# Patient Record
Sex: Female | Born: 1960 | Race: Black or African American | Hispanic: No | Marital: Married
Health system: Midwestern US, Community
[De-identification: ages and names within clinical notes are randomized; demographics above are authoritative.]

## PROBLEM LIST (undated history)

## (undated) DIAGNOSIS — K2900 Acute gastritis without bleeding: Secondary | ICD-10-CM

## (undated) DIAGNOSIS — K388 Other specified diseases of appendix: Secondary | ICD-10-CM

## (undated) DIAGNOSIS — N186 End stage renal disease: Secondary | ICD-10-CM

## (undated) DIAGNOSIS — Z992 Dependence on renal dialysis: Secondary | ICD-10-CM

## (undated) DIAGNOSIS — K3184 Gastroparesis: Secondary | ICD-10-CM

## (undated) DIAGNOSIS — I10 Essential (primary) hypertension: Secondary | ICD-10-CM

## (undated) DIAGNOSIS — F32A Depression, unspecified: Secondary | ICD-10-CM

## (undated) DIAGNOSIS — F329 Major depressive disorder, single episode, unspecified: Secondary | ICD-10-CM

## (undated) DIAGNOSIS — M869 Osteomyelitis, unspecified: Secondary | ICD-10-CM

## (undated) HISTORY — PX: AV FISTULA PLACEMENT: SHX1204

## (undated) HISTORY — PX: PACEMAKER INSERTION: SHX728

## (undated) HISTORY — PX: CHOLECYSTECTOMY: SHX55

## (undated) HISTORY — PX: BACK SURGERY: SHX140

---

## 2007-02-19 ENCOUNTER — Ambulatory Visit: Payer: Self-pay | Admitting: Cardiology

## 2007-06-19 DIAGNOSIS — M869 Osteomyelitis, unspecified: Secondary | ICD-10-CM

## 2007-06-19 HISTORY — DX: Osteomyelitis, unspecified: M86.9

## 2009-01-24 NOTE — Patient Instructions (Signed)
WE will call with plan for surgery

## 2009-01-24 NOTE — Progress Notes (Signed)
HISTORY OF PRESENT ILLNESS  Karen Fuller is a 48 y.o. female. Previousliy evaluated for Gastroparesis.   Marland KitchenHPI  In the interim 5 weeks Karen Fuller has been admitted on one occaision for one week  Her symtpoms are unchanged since our last eval.  She has reviewed the literature I provided her regarding enterra.      ROS    Physical Exam   Constitutional: She appears well-developed and well-nourished.   Eyes: No scleral icterus.   Neck: Neck supple. No tracheal deviation present. No thyromegaly present.   Cardiovascular: Normal rate, regular rhythm and normal heart sounds.    Pulmonary/Chest: Effort normal and breath sounds normal.   Abdominal: Soft. Bowel sounds are normal. She exhibits no distension. No tenderness. She has no rebound and no guarding.            Previous scars well healed   Musculoskeletal: She exhibits edema. She exhibits no tenderness.   Skin: Skin is warm and dry.       ASSESSMENT and PLAN    Essentially no improvement over the past five weeks.  The patient desires to move ahead with Enterra placement.  Risks an cautions discussed.  We will proceed with insurance eval

## 2009-02-04 NOTE — Progress Notes (Addendum)
Pt scheduled for gastric stim placement 03/02/09

## 2009-02-16 LAB — CBC WITH AUTOMATED DIFF
ABS. BASOPHILS: 0 10*3/uL (ref 0.0–0.1)
ABS. EOSINOPHILS: 0.3 10*3/uL (ref 0.0–0.4)
ABS. LYMPHOCYTES: 2 10*3/uL (ref 0.8–3.5)
ABS. MONOCYTES: 0.5 10*3/uL (ref 0.0–1.0)
ABS. NEUTROPHILS: 5.3 10*3/uL (ref 1.8–8.0)
BASOPHILS: 0 % (ref 0–1)
EOSINOPHILS: 4 % (ref 0–7)
HCT: 35.4 % (ref 35.0–47.0)
HGB: 11.7 g/dL (ref 11.5–16.0)
LYMPHOCYTES: 24 % (ref 12–49)
MCH: 31 PG (ref 26.0–34.0)
MCHC: 33.1 g/dL (ref 30.0–36.5)
MCV: 93.9 FL (ref 80.0–99.0)
MONOCYTES: 6 % (ref 5–13)
NEUTROPHILS: 66 % (ref 32–75)
PLATELET: 235 10*3/uL (ref 150–400)
RBC: 3.77 M/uL — ABNORMAL LOW (ref 3.80–5.20)
RDW: 15.1 % — ABNORMAL HIGH (ref 11.5–14.5)
WBC: 8.1 10*3/uL (ref 3.6–11.0)

## 2009-02-16 LAB — URINALYSIS W/ REFLEX CULTURE
Bilirubin: NEGATIVE
Glucose: 250 MG/DL — AB
Ketone: NEGATIVE MG/DL
Leukocyte Esterase: NEGATIVE
Nitrites: NEGATIVE
Protein: 300 MG/DL — AB
Specific gravity: 1.016 (ref 1.003–1.030)
Urobilinogen: 0.2 EU/DL (ref 0.2–1.0)
pH (UA): 7.5 (ref 5.0–8.0)

## 2009-02-16 LAB — METABOLIC PANEL, BASIC
Anion gap: 14 mmol/L (ref 5–15)
BUN/Creatinine ratio: 5 — ABNORMAL LOW (ref 12–20)
BUN: 48 MG/DL — ABNORMAL HIGH (ref 6–20)
CO2: 26 MMOL/L (ref 21–32)
Calcium: 8.3 MG/DL — ABNORMAL LOW (ref 8.5–10.1)
Chloride: 98 MMOL/L (ref 97–108)
Creatinine: 10.5 MG/DL — ABNORMAL HIGH (ref 0.6–1.3)
GFR est AA: 5 mL/min/{1.73_m2} — ABNORMAL LOW (ref >60)
GFR est non-AA: 4 mL/min/{1.73_m2} — ABNORMAL LOW (ref >60)
Glucose: 140 MG/DL — ABNORMAL HIGH (ref 65–100)
Potassium: 4.4 MMOL/L (ref 3.5–5.1)
Sodium: 138 MMOL/L (ref 136–145)

## 2009-02-16 NOTE — Progress Notes (Signed)
HISTORY OF PRESENT ILLNESS  Karen Fuller is a 48 y.o. female who has been diagnosed with gastroparesis x2 years. She has nausea/vomiting every other day with chronic abd pain. She has been hospitalized almost weekly x the past 5 weeks She states that her pain is all LUQ pain.   HPI    Review of Systems   Gastrointestinal: Positive for nausea, vomiting and abdominal pain.     Physical Exam   Constitutional: She is oriented to person, place, and time. She appears well-developed and well-nourished.   HENT:   Head: Normocephalic.   Neck: Normal range of motion. Neck supple.   Cardiovascular: Normal rate and regular rhythm.  Exam reveals no gallop and no friction rub.    No murmur heard.  Pulmonary/Chest: Effort normal and breath sounds normal.   Abdominal: Soft. Bowel sounds are normal. She exhibits no distension. No tenderness.          Neurological: She is alert and oriented to person, place, and time.   Skin: Skin is warm and dry.   Psychiatric: She has a normal mood and affect.     ASSESSMENT and PLAN  48 year old female diagnosed with gastroparesis x 2 years.   The patient desires to move ahead with Enterra placement.?? Risks an cautions discussed.

## 2009-02-17 LAB — CULTURE, URINE
Colonies Counted: <1000
Colony Count: <1000
Culture result:: NO GROWTH
Culture: NO GROWTH

## 2009-04-18 ENCOUNTER — Encounter

## 2009-04-18 NOTE — Progress Notes (Signed)
History and Physical    Subjective:     Karen Fuller is a 48 y.o.female with a history of gastroparesis for over 2 years.  .  . She has nausea/vomiting every other day with chronic abd pain. She has been hospitalized almost weekly in August and Sept for off and on for 5 weeks. She has been hospitalized 2 times in the past 3 weeks for nausea, vomiting and Abd pain. She states that she can eat normally when she does not have the pain. When she has pain she can only tolerate clear liquids.  She states that her pain is all LUQ pain.   Past Medical History   Diagnosis Date   ??? Reflux 01/12/2009   ??? DM (diabetes mellitus) 01/12/2009   ??? HTN (hypertension) 01/12/2009   ??? Chronic kidney failure 01/12/2009   ??? Abdominal pain 01/12/2009   ??? Nausea & vomiting 01/12/2009   ??? Gastroparesis 01/12/2009   ??? GERD (gastroesophageal reflux disease)    ??? Renal disease         Past Surgical History   Procedure Date   ??? Hx cesarean section      x2   ??? Hx cholecystectomy        No family history on file.   History   Substance Use Topics   ??? Tobacco Use: Not on file   ??? Alcohol Use:          Prior to Admission medications    Medication Sig Start Date End Date Taking? Authorizing Provider   esomeprazole (NEXIUM) 40 mg capsule Take  by mouth daily.   Yes Historical Provider   promethazine (PHENERGAN) 25 mg tablet Take 25 mg by mouth every six (6) hours as needed.   Yes Historical Provider   metoclopramide (REGLAN) 10 mg tablet Take 10 mg by mouth four (4) times daily (with meals and at night).   Yes Historical Provider   aspirin 81 mg chewable tablet Take 81 mg by mouth daily.   Yes Historical Provider   oxycodone-acetaminophen (PERCOCET) 5-325 mg per tablet Take 1 Tab by mouth every four (4) hours as needed.   Yes Historical Provider   calcium acetate (PHOSLO) 667 mg Cap Take  by mouth three (3) times daily (with meals).   Yes Historical Provider    scopolamine (TRANSDERM-SCOP) 1.5 mg 1 Patch by TransDERmal route every seventy-two (72) hours.   Yes Historical Provider   sucralfate (CARAFATE) 1 gram tablet Take 1 g by mouth four (4) times daily.   Yes Historical Provider   FOLIC ACID/VITAMIN B COMP W-C (RENA-VITE PO) Take  by mouth.   Yes Historical Provider   quetiapine (SEROQUEL) 50 mg tablet Take 50 mg by mouth two (2) times a day.   Yes Historical Provider   citalopram (CELEXA) 10 mg tablet Take  by mouth daily.   Yes Historical Provider   carvedilol (COREG) 25 mg tablet Take 25 mg by mouth two (2) times daily (with meals).   Yes Historical Provider   insulin glargine (LANTUS) 100 unit/mL injection by SubCUTAneous route once.   Yes Historical Provider   insulin aspart (NOVOLOG FLEXPEN) 100 unit/mL flexpen by SubCUTAneous route.   Yes Historical Provider   alprazolam Prudy Feeler) 0.25 mg tablet Take  by mouth nightly as needed.   Yes Historical Provider   pregabalin (LYRICA) 75 mg capsule Take  by mouth.    Historical Provider   meclizine (ANTIVERT) 12.5 mg tablet Take  by mouth three (3) times  daily as needed.    Historical Provider   fosinopril-hydrochlorothiazide (MONOPRIL HCT) 20-12.5 mg per tablet Take 1 Tab by mouth daily.    Historical Provider   nitroglycerin (NITRODUR) 0.4 mg/hr 1 Patch by TransDERmal route daily.    Historical Provider   amlodipine (NORVASC) 10 mg tablet Take  by mouth daily.    Historical Provider   ERGOCALCIFEROL (VITAMIN D PO) Take  by mouth.    Historical Provider   pantoprazole (PROTONIX) 40 mg tablet Take 40 mg by mouth daily.    Historical Provider   clonidine (CATAPRESS) 0.3 mg tablet Take 0.3 mg by mouth two (2) times a day.    Historical Provider   HYDROCODONE BIT/ACETAMINOPHEN (LORTAB PO) Take  by mouth.    Historical Provider       Allergies   Allergen Reactions   ??? Hydromorphone Shortness of Breath          Review of Systems:  Significant for nausea, vomiting and Abd pain.    Objective:     Intake and Output:             Physical Exam:   BP 114/60   Pulse 83   Temp 97.5 ??F (36.4 ??C)   Ht 5\' 2"  (1.575 m)   Wt 239 lb 8 oz (108.636 kg)   SpO2 97%  General appearance: alert, cooperative, no distress, appears stated age  Lungs: clear to auscultation bilaterally  Heart: regular rate and rhythm, S1, S2 normal, no murmur, click, rub or gallop  Abdomen: soft, non-tender. Bowel sounds normal. No masses,  no organomegaly  Skin: Skin color, texture, turgor normal. No rashes or lesions        Assessment:     48 year old female with 2 year history of gastroparesis   Plan:     Enterra placement. Consent was signed. All patients questions were answered at the time of the visit by Dr. Geronimo Boot.   Patient is ready to proceed with surgery.     Signed By: Rae Roam Leonor Liv, NP     April 18, 2009

## 2009-04-18 NOTE — Progress Notes (Signed)
H & P gastric stimulater insertion 04-28-09

## 2009-04-18 NOTE — Progress Notes (Addendum)
Addended byAreta Haber on: 04/18/2009      Modules accepted: Orders

## 2009-04-19 LAB — METABOLIC PANEL, COMPREHENSIVE
A-G Ratio: 1 — ABNORMAL LOW (ref 1.1–2.2)
ALT (SGPT): 34 U/L (ref 12–78)
AST (SGOT): 23 U/L (ref 15–37)
Albumin: 3.5 g/dL (ref 3.5–5.0)
Alk. phosphatase: 165 U/L — ABNORMAL HIGH (ref 50–136)
Anion gap: 21 mmol/L — ABNORMAL HIGH (ref 5–15)
BUN/Creatinine ratio: 7 — ABNORMAL LOW (ref 12–20)
BUN: 70 MG/DL — ABNORMAL HIGH (ref 6–20)
Bilirubin, total: 0.3 MG/DL (ref 0.2–1.0)
CO2: 16 MMOL/L — ABNORMAL LOW (ref 21–32)
Calcium: 8.4 MG/DL — ABNORMAL LOW (ref 8.5–10.1)
Chloride: 101 MMOL/L (ref 97–108)
Creatinine: 10.6 MG/DL — ABNORMAL HIGH (ref 0.6–1.3)
GFR est AA: 5 mL/min/{1.73_m2} — ABNORMAL LOW (ref >60)
GFR est non-AA: 4 mL/min/{1.73_m2} — ABNORMAL LOW (ref >60)
Globulin: 3.4 g/dL (ref 2.0–4.0)
Glucose: 98 MG/DL (ref 65–100)
Potassium: 4.8 MMOL/L (ref 3.5–5.1)
Protein, total: 6.9 g/dL (ref 6.4–8.2)
Sodium: 138 MMOL/L (ref 136–145)

## 2009-04-19 LAB — URINE MICROSCOPIC ONLY

## 2009-04-19 LAB — URINALYSIS W/ RFLX MICROSCOPIC
Bilirubin: NEGATIVE
Glucose: 250 MG/DL — AB
Ketone: NEGATIVE MG/DL
Nitrites: NEGATIVE
Protein: 300 MG/DL — AB
Specific gravity: 1.015 (ref 1.003–1.030)
Urobilinogen: 0.2 EU/DL (ref 0.2–1.0)
pH (UA): 7.5 (ref 5.0–8.0)

## 2009-04-19 NOTE — Progress Notes (Addendum)
Addended by: Huntley Dec on: 04/19/2009      Modules accepted: Level of Service

## 2009-04-28 ENCOUNTER — Inpatient Hospital Stay
Admit: 2009-04-28 | Discharge: 2009-04-30 | Disposition: A | Discharge status: Home / Self Care | Payer: MEDICARE | Attending: Specialist | Admitting: Specialist

## 2009-04-28 DIAGNOSIS — E1149 Type 2 diabetes mellitus with other diabetic neurological complication: Secondary | ICD-10-CM

## 2009-04-28 LAB — CBC WITH AUTOMATED DIFF
ABS. BASOPHILS: 0 10*3/uL (ref 0.0–0.1)
ABS. EOSINOPHILS: 0.1 10*3/uL (ref 0.0–0.4)
ABS. LYMPHOCYTES: 1.3 10*3/uL (ref 0.8–3.5)
ABS. MONOCYTES: 0.4 10*3/uL (ref 0.0–1.0)
ABS. NEUTROPHILS: 8.2 10*3/uL — ABNORMAL HIGH (ref 1.8–8.0)
BASOPHILS: 0 % (ref 0–1)
EOSINOPHILS: 1 % (ref 0–7)
HCT: 34.9 % — ABNORMAL LOW (ref 35.0–47.0)
HGB: 11.6 g/dL (ref 11.5–16.0)
LYMPHOCYTES: 13 % (ref 12–49)
MCH: 28.7 PG (ref 26.0–34.0)
MCHC: 33.2 g/dL (ref 30.0–36.5)
MCV: 86.4 FL (ref 80.0–99.0)
MONOCYTES: 4 % — ABNORMAL LOW (ref 5–13)
NEUTROPHILS: 82 % — ABNORMAL HIGH (ref 32–75)
PLATELET: 226 10*3/uL (ref 150–400)
RBC: 4.04 M/uL (ref 3.80–5.20)
RDW: 14.4 % (ref 11.5–14.5)
WBC: 10 10*3/uL (ref 3.6–11.0)

## 2009-04-28 LAB — METABOLIC PANEL, BASIC
Anion gap: 14 mmol/L (ref 5–15)
BUN/Creatinine ratio: 5 — ABNORMAL LOW (ref 12–20)
BUN: 39 MG/DL — ABNORMAL HIGH (ref 6–20)
CO2: 26 MMOL/L (ref 21–32)
Calcium: 8.5 MG/DL (ref 8.5–10.1)
Chloride: 94 MMOL/L — ABNORMAL LOW (ref 97–108)
Creatinine: 7.5 MG/DL — ABNORMAL HIGH (ref 0.6–1.3)
GFR est AA: 8 mL/min/{1.73_m2} — ABNORMAL LOW (ref >60)
GFR est non-AA: 6 mL/min/{1.73_m2} — ABNORMAL LOW (ref >60)
Glucose: 232 MG/DL — ABNORMAL HIGH (ref 65–100)
Potassium: 4.8 MMOL/L (ref 3.5–5.1)
Sodium: 134 MMOL/L — ABNORMAL LOW (ref 136–145)

## 2009-04-28 LAB — GLUCOSE, POC
Glucose (POC): 224 mg/dL — ABNORMAL HIGH (ref 65–105)
Glucose (POC): 237 mg/dL — ABNORMAL HIGH (ref 65–105)
Glucose (POC): 122 mg/dL — ABNORMAL HIGH (ref 65–105)

## 2009-04-28 MED ORDER — SODIUM CHLORIDE 0.9 % INJECTION
5 mg/mL | Freq: Four times a day (QID) | INTRAMUSCULAR | Status: DC | PRN
Start: 2009-04-28 — End: 2009-04-30

## 2009-04-28 MED ORDER — CLINDAMYCIN 600 MG/4 ML IV
600 mg/4 mL | INTRAVENOUS | Status: AC
Start: 2009-04-28 — End: 2009-04-28
  Administered 2009-04-28: 16:00:00 via INTRAVENOUS

## 2009-04-28 MED ORDER — AMLODIPINE 5 MG TAB
5 mg | Freq: Every day | ORAL | Status: DC
Start: 2009-04-28 — End: 2009-04-30
  Administered 2009-04-30: 15:00:00 via ORAL

## 2009-04-28 MED ORDER — ONDANSETRON (PF) 4 MG/2 ML INJECTION
4 mg/2 mL | INTRAMUSCULAR | Status: DC | PRN
Start: 2009-04-28 — End: 2009-04-28

## 2009-04-28 MED ORDER — FOSINOPRIL-HYDROCHLOROTHIAZIDE 20 MG-12.5 MG TAB
Freq: Every day | ORAL | Status: DC
Start: 2009-04-28 — End: 2009-04-28

## 2009-04-28 MED ORDER — CLINDAMYCIN IN D5W 600 MG/50 ML IV PIGGY BACK
600 mg/50 mL | INTRAVENOUS | Status: AC
Start: 2009-04-28 — End: ?

## 2009-04-28 MED ORDER — CHOLECALCIFEROL (VITAMIN D3) 1,000 UNIT (25 MCG) TAB
Freq: Every day | ORAL | Status: DC
Start: 2009-04-28 — End: 2009-04-30
  Administered 2009-04-30: 15:00:00 via ORAL

## 2009-04-28 MED ORDER — NITROGLYCERIN 0.4 MG/HR TRANSDERM 24 HR PATCH
0.4 mg/hr | Freq: Every day | TRANSDERMAL | Status: DC
Start: 2009-04-28 — End: 2009-04-28

## 2009-04-28 MED ORDER — LIDOCAINE (PF) 10 MG/ML (1 %) IJ SOLN
10 mg/mL (1 %) | INTRAMUSCULAR | Status: DC | PRN
Start: 2009-04-28 — End: 2009-04-28

## 2009-04-28 MED ORDER — MIDAZOLAM 1 MG/ML IJ SOLN
1 mg/mL | INTRAMUSCULAR | Status: AC
Start: 2009-04-28 — End: ?

## 2009-04-28 MED ORDER — D5-1/2 NS & POTASSIUM CHLORIDE 20 MEQ/L IV
20 mEq/L | INTRAVENOUS | Status: AC
Start: 2009-04-28 — End: 2009-04-28
  Administered 2009-04-28: 18:00:00 via INTRAVENOUS

## 2009-04-28 MED ORDER — ACETAMINOPHEN 325 MG TABLET
325 mg | ORAL | Status: DC | PRN
Start: 2009-04-28 — End: 2009-04-30

## 2009-04-28 MED ORDER — DROPERIDOL 2.5 MG/ML IJ SOLN
2.5 mg/mL | INTRAMUSCULAR | Status: DC | PRN
Start: 2009-04-28 — End: 2009-04-28

## 2009-04-28 MED ORDER — SODIUM CHLORIDE 0.9 % IJ SYRG
INTRAMUSCULAR | Status: DC
Start: 2009-04-28 — End: 2009-04-28

## 2009-04-28 MED ORDER — HEPARIN (PORCINE) 5,000 UNIT/ML IJ SOLN
5000 unit/mL | Freq: Two times a day (BID) | INTRAMUSCULAR | Status: DC
Start: 2009-04-28 — End: 2009-04-30
  Administered 2009-04-29 – 2009-04-30 (×3): via SUBCUTANEOUS

## 2009-04-28 MED ORDER — FENTANYL CITRATE (PF) 50 MCG/ML IJ SOLN
50 mcg/mL | INTRAMUSCULAR | Status: DC | PRN
Start: 2009-04-28 — End: 2009-04-28

## 2009-04-28 MED ORDER — INSULIN ASPART 100 UNIT/ML INJECTION
100 unit/mL | Freq: Four times a day (QID) | SUBCUTANEOUS | Status: DC
Start: 2009-04-28 — End: 2009-04-30
  Administered 2009-04-28 – 2009-04-30 (×8): via SUBCUTANEOUS

## 2009-04-28 MED ORDER — KETOROLAC TROMETHAMINE 30 MG/ML INJECTION
30 mg/mL (1 mL) | Freq: Four times a day (QID) | INTRAMUSCULAR | Status: AC | PRN
Start: 2009-04-28 — End: 2009-04-29

## 2009-04-28 MED ORDER — SODIUM CHLORIDE 0.9 % IV
INTRAVENOUS | Status: DC
Start: 2009-04-28 — End: 2009-04-28

## 2009-04-28 MED ORDER — HYDROMORPHONE (PF) 1 MG/ML IJ SOLN
1 mg/mL | INTRAMUSCULAR | Status: DC | PRN
Start: 2009-04-28 — End: 2009-04-30

## 2009-04-28 MED ORDER — FENTANYL CITRATE (PF) 50 MCG/ML IJ SOLN
50 mcg/mL | INTRAMUSCULAR | Status: AC
Start: 2009-04-28 — End: ?

## 2009-04-28 MED ORDER — CLONIDINE 0.2 MG TAB
0.2 mg | Freq: Two times a day (BID) | ORAL | Status: DC
Start: 2009-04-28 — End: 2009-04-30
  Administered 2009-04-30: 15:00:00 via ORAL

## 2009-04-28 MED ORDER — FAMOTIDINE (PF) 20 MG/2 ML IV
20 mg/2 mL | INTRAVENOUS | Status: AC
Start: 2009-04-28 — End: ?

## 2009-04-28 MED ORDER — PROMETHAZINE 25 MG/ML INJECTION
25 mg/mL | Freq: Four times a day (QID) | INTRAMUSCULAR | Status: DC | PRN
Start: 2009-04-28 — End: 2009-04-28

## 2009-04-28 MED ORDER — ONDANSETRON (PF) 4 MG/2 ML INJECTION
4 mg/2 mL | Freq: Once | INTRAMUSCULAR | Status: DC
Start: 2009-04-28 — End: 2009-04-28

## 2009-04-28 MED ORDER — MORPHINE 2 MG/ML INJECTION
2 mg/mL | INTRAMUSCULAR | Status: DC | PRN
Start: 2009-04-28 — End: 2009-04-30
  Administered 2009-04-29 – 2009-04-30 (×6): via INTRAVENOUS

## 2009-04-28 MED ORDER — FOSINOPRIL 10 MG TAB
10 mg | Freq: Every day | ORAL | Status: DC
Start: 2009-04-28 — End: 2009-04-30
  Administered 2009-04-30: 15:00:00 via ORAL

## 2009-04-28 MED ORDER — METOCLOPRAMIDE 5 MG/ML IJ SOLN
5 mg/mL | INTRAMUSCULAR | Status: AC
Start: 2009-04-28 — End: ?

## 2009-04-28 MED ORDER — LACTATED RINGERS IV
INTRAVENOUS | Status: DC
Start: 2009-04-28 — End: 2009-04-28

## 2009-04-28 MED ORDER — QUETIAPINE 25 MG TAB
25 mg | Freq: Two times a day (BID) | ORAL | Status: DC
Start: 2009-04-28 — End: 2009-04-30
  Administered 2009-04-28 – 2009-04-29 (×2): via ORAL

## 2009-04-28 MED ORDER — METOCLOPRAMIDE 5 MG/ML IJ SOLN
5 mg/mL | Freq: Four times a day (QID) | INTRAMUSCULAR | Status: DC
Start: 2009-04-28 — End: 2009-04-30
  Administered 2009-04-28 – 2009-04-30 (×6): via INTRAVENOUS

## 2009-04-28 MED ORDER — HYDROCHLOROTHIAZIDE 25 MG TAB
25 mg | Freq: Every day | ORAL | Status: DC
Start: 2009-04-28 — End: 2009-04-28

## 2009-04-28 MED ORDER — D5-1/2 NS & POTASSIUM CHLORIDE 20 MEQ/L IV
20 mEq/L | INTRAVENOUS | Status: DC
Start: 2009-04-28 — End: 2009-04-30
  Administered 2009-04-30: 13:00:00 via INTRAVENOUS

## 2009-04-28 MED ORDER — ONDANSETRON (PF) 4 MG/2 ML INJECTION
4 mg/2 mL | INTRAMUSCULAR | Status: AC
Start: 2009-04-28 — End: 2009-04-28
  Administered 2009-04-28: 15:00:00 via INTRAVENOUS

## 2009-04-28 MED ORDER — MORPHINE 10 MG/ML INJ SOLUTION
10 mg/ml | INTRAMUSCULAR | Status: DC | PRN
Start: 2009-04-28 — End: 2009-04-28

## 2009-04-28 MED ORDER — INSULIN ASPART 100 UNIT/ML INJECTION
100 unit/mL | SUBCUTANEOUS | Status: AC
Start: 2009-04-28 — End: 2009-04-29

## 2009-04-28 MED ORDER — ONDANSETRON 4 MG TAB, RAPID DISSOLVE
4 mg | ORAL | Status: DC | PRN
Start: 2009-04-28 — End: 2009-04-30
  Administered 2009-04-30: 10:00:00 via ORAL

## 2009-04-28 MED ORDER — CLINDAMYCIN IN D5W 600 MG/50 ML IV PIGGY BACK
600 mg/50 mL | Freq: Three times a day (TID) | INTRAVENOUS | Status: AC
Start: 2009-04-28 — End: 2009-04-29
  Administered 2009-04-29 (×2): via INTRAVENOUS

## 2009-04-28 MED ORDER — FENTANYL CITRATE (PF) 50 MCG/ML IJ SOLN
50 mcg/mL | INTRAMUSCULAR | Status: DC | PRN
Start: 2009-04-28 — End: 2009-04-28
  Administered 2009-04-28 (×2): via INTRAVENOUS

## 2009-04-28 MED ORDER — ONDANSETRON (PF) 4 MG/2 ML INJECTION
4 mg/2 mL | INTRAMUSCULAR | Status: AC
Start: 2009-04-28 — End: ?

## 2009-04-28 MED ORDER — DIPHENHYDRAMINE 25 MG CAP
25 mg | ORAL | Status: DC | PRN
Start: 2009-04-28 — End: 2009-04-30

## 2009-04-28 MED ORDER — CALCIUM ACETATE 667 MG CAP
667 mg | Freq: Three times a day (TID) | ORAL | Status: DC
Start: 2009-04-28 — End: 2009-04-30
  Administered 2009-04-28 – 2009-04-30 (×5): via ORAL

## 2009-04-28 MED ORDER — OXYCODONE-ACETAMINOPHEN 5 MG-325 MG TAB
5-325 mg | ORAL | Status: DC | PRN
Start: 2009-04-28 — End: 2009-04-30
  Administered 2009-04-30: 15:00:00 via ORAL

## 2009-04-28 MED ORDER — MEPERIDINE (PF) 25 MG/ML INJ SOLUTION
25 mg/ml | Freq: Once | INTRAMUSCULAR | Status: DC
Start: 2009-04-28 — End: 2009-04-28

## 2009-04-28 MED ORDER — CITALOPRAM 20 MG TAB
20 mg | Freq: Every day | ORAL | Status: DC
Start: 2009-04-28 — End: 2009-04-30
  Administered 2009-04-30: 15:00:00 via ORAL

## 2009-04-28 MED ORDER — DEXAMETHASONE SODIUM PHOSPHATE 4 MG/ML IJ SOLN
4 mg/mL | Freq: Once | INTRAMUSCULAR | Status: DC | PRN
Start: 2009-04-28 — End: 2009-04-28

## 2009-04-28 MED ORDER — BUPIVACAINE-EPINEPHRINE (PF) 0.5 %-1:200,000 IJ SOLN
0.5 %-1:200,000 | Freq: Once | INTRAMUSCULAR | Status: AC
Start: 2009-04-28 — End: 2009-04-28
  Administered 2009-04-28: 16:00:00 via EPIDURAL

## 2009-04-28 MED ORDER — MIDAZOLAM 1 MG/ML IJ SOLN
1 mg/mL | INTRAMUSCULAR | Status: DC | PRN
Start: 2009-04-28 — End: 2009-04-28

## 2009-04-28 MED ORDER — CARVEDILOL 12.5 MG TAB
12.5 mg | Freq: Two times a day (BID) | ORAL | Status: DC
Start: 2009-04-28 — End: 2009-04-30
  Administered 2009-04-28 – 2009-04-30 (×4): via ORAL

## 2009-04-28 MED ORDER — NITROGLYCERIN 0.4 MG/HR TRANSDERM 24 HR PATCH
0.4 mg/hr | Freq: Every day | TRANSDERMAL | Status: DC
Start: 2009-04-28 — End: 2009-04-30

## 2009-04-28 MED FILL — FAMOTIDINE (PF) 20 MG/2 ML IV: 20 mg/2 mL | INTRAVENOUS | Qty: 2

## 2009-04-28 MED FILL — FENTANYL CITRATE (PF) 50 MCG/ML IJ SOLN: 50 mcg/mL | INTRAMUSCULAR | Qty: 5

## 2009-04-28 MED FILL — CLEOCIN 600 MG/50 ML IN 5 % DEXTROSE INTRAVENOUS PIGGYBACK: 600 mg/50 mL | INTRAVENOUS | Qty: 50

## 2009-04-28 MED FILL — CALCIUM ACETATE 667 MG CAP: 667 mg | ORAL | Qty: 1

## 2009-04-28 MED FILL — LACTATED RINGERS IV: INTRAVENOUS | Qty: 1000

## 2009-04-28 MED FILL — METOCLOPRAMIDE 5 MG/ML IJ SOLN: 5 mg/mL | INTRAMUSCULAR | Qty: 2

## 2009-04-28 MED FILL — D5-1/2 NS & POTASSIUM CHLORIDE 20 MEQ/L IV: 20 mEq/L | INTRAVENOUS | Qty: 1000

## 2009-04-28 MED FILL — SEROQUEL 25 MG TABLET: 25 mg | ORAL | Qty: 2

## 2009-04-28 MED FILL — ONDANSETRON (PF) 4 MG/2 ML INJECTION: 4 mg/2 mL | INTRAMUSCULAR | Qty: 2

## 2009-04-28 MED FILL — FENTANYL CITRATE (PF) 50 MCG/ML IJ SOLN: 50 mcg/mL | INTRAMUSCULAR | Qty: 2

## 2009-04-28 MED FILL — MIDAZOLAM 1 MG/ML IJ SOLN: 1 mg/mL | INTRAMUSCULAR | Qty: 2

## 2009-04-28 MED FILL — INSULIN ASPART 100 UNIT/ML INJECTION: 100 unit/mL | SUBCUTANEOUS | Qty: 1

## 2009-04-28 MED FILL — CARVEDILOL 12.5 MG TAB: 12.5 mg | ORAL | Qty: 2

## 2009-04-28 MED FILL — SODIUM CHLORIDE 0.9 % IV: INTRAVENOUS | Qty: 1000

## 2009-04-28 MED FILL — CLINDAMYCIN 600 MG/4 ML IV: 600 mg/4 mL | INTRAVENOUS | Qty: 4

## 2009-04-28 MED FILL — BD POSIFLUSH NORMAL SALINE 0.9 % INJECTION SYRINGE: INTRAMUSCULAR | Qty: 10

## 2009-04-28 MED FILL — PROMETHAZINE 25 MG/ML INJECTION: 25 mg/mL | INTRAMUSCULAR | Qty: 0.5

## 2009-04-28 MED FILL — CLONIDINE 0.2 MG TAB: 0.2 mg | ORAL | Qty: 1

## 2009-04-28 NOTE — Op Note (Signed)
Op  Notes signed by Areta Haber at 04/29/09 0330                 Author: Areta Haber  Service: --  Author Type: Physician       Filed: 04/29/09 0330  Date of Service: 04/28/09 1517  Status: Signed          Editor: Areta Haber          <!--EPICS--> Name:      Karen Fuller, Karen Fuller<BR> MR #:      [id number]                    Surgeon:        Karen Fuller, <BR> MD<BR> Account  #: [id number]                 Surgery Date:   04/28/2009<BR> DOB:       1960-07-12<BR> Age:       48                           Location:       [id number]<BR> <BR>                              OPERATIVE REPORT<BR> <BR> <BR> PREOPERATIVE DIAGNOSES<BR>  1. 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 Diffuse gastritis.<BR> <BR> POSTOPERATIVE DIAGNOSES<BR> 1. 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 Diffuse gastritis.<BR> <BR> OPERATIVE PROCEDURE<BR> 1. Laparoscopic  placement of Enterra gastric electrical stimulation leads<BR> x2.<BR> 2. Placement of Enterra generator.<BR> 3. Intraoperative programming.<BR> 4. Intraoperative endoscopy with biopsy.<BR> <BR> SURGEON:  Karen Hazard L. Elton Catalano, MD<BR> <BR> ASSISTANT:  Programmer, multimedia, PA-C<BR> <BR> ANESTHESIA:  General endotracheal anesthesia.<BR> <BR> COMPLICATIONS:  None.<BR> <BR> SPONGE AND NEEDLE COUNTS:  Correct at the end of the case.<BR> <BR> INDICATIONS FOR PROCEDURE:  A 48 year old African American female, severe<BR>  insulin-dependent diabetic for many, many years with the diabetic<BR> complications of end-stage renal disease, neuropathy, and diabetic<BR> gastroparesis, now with multiple hospitalization secondary to profuse<BR> nausea and vomiting and general intolerance  to p.o., now for placement of<BR> Enterra generator.<BR> <BR> PROCEDURE:  The patient was taken to the operating room and placed on the<BR> operating table in supine position. After the uneventful induction of<BR> general endotracheal anesthesia, the  abdomen  was prepped with ChloraPrep,<BR> draped with sterile towels, and a sterile drape. A supraumbilical incision<BR> was made and carried down in a nonbladed 5-mm direct viewing trocar and<BR> placed into the abdominal cavity using a 30-degree, 5 mm  laparoscope. The<BR> obturator was removed. The abdomen was fully insufflated. Additional 5-mm<BR> trocar was placed on the patient's right side then a 12-mm trocar in the<BR> left upper quadrant. There were some adhesions at the patient's previous<BR>  J-tube site and at the low midline. These were not disturbed to perform the<BR> operation. The greater curve of the stomach was easily identified as was<BR> the pylorus. A site 10 cm proximal to the pylorus was marked on the<BR> anterior surface of the  stomach using Bovie cautery. Two prepared Enterra<BR> leads were placed into the abdominal cavity. The ski needle of each was<BR> used to create an intramural tunnel from the 10-cm mark toward the pylorus,<BR> approximately 1 cm apart and parallel. The  blue Prolene sutures were<BR> brought into the intramural tunnels. Intraoperative endoscopy was then<BR> performed by Dr. Geronimo Fuller by placing the GF-160 endoscope into the<BR> oropharynx, traversed the upper  esophageal sphincter with jaw thrust, down<BR>  the esophagus into the stomach. Upon entry into the stomach, the stomach<BR> was diffusely cobblestoned with obvious erythema consistent with gastritis,<BR> so biopsies x2 were taken in the body of the stomach, away from the site<BR> where the generator  leads were to be placed. Inspection of the lead<BR> locations showed no perforation of the mucosa and no dimpling of the mucosa<BR> in the area of the leads. The sites of biopsy were inspected. Hemostasis<BR> was achieved. Thus, air was removed from the  stomach and the scope<BR> eventually retrieved at the end of the case.<BR> <BR> The surgeon regowned and regloved. The electrical terminal portions of the<BR> leads were  brought into their intramural tunnels. Silicone disks were<BR> placed on each of  the Prolene sutures and secured with Lapra-Ty. Each of<BR> the 4 silk sutures on the flanges of the leads were then sewn to the<BR> anterior abdominal wall and tied intracorporeally so that they were secure<BR> and the terminal portions and leads were  completely within the intramural<BR> tunnel. The generator terminals and the leads were then brought out to the<BR> 12-mm trocar site. The abdomen was desufflated and the trocar was removed.<BR> A suprafascial subcutaneous pocket was created for the generator  and<BR> inspected for hemostasis using Bovie cautery. A generator was brought up<BR> onto the field. This was connected to the generator lead terminals. These<BR> were hand tightened with the screwdriver. The excess lead wire was coiled<BR> into the pocket  and the generator placed into the pocket. The generator was<BR> then interrogated. The impedance across the leads was 579 ohms. The<BR> generator was then secured to the fascia in 2 locations using 2-0 Prolene<BR> suture. The wounds were inspected for  hemostasis.<BR> <BR> The generator was programmed at 7 volts, 14 Hz, 330 msec pulse width, 0.1<BR> second on, 5 second off time. After the wounds were closed and sealed with<BR> Dermabond skin sealant, the generator was placed in the on position.<BR>  <BR> The patient tolerated the procedure well, was awake, alert, and extubated<BR> in the operating room and taken to the recovery room in good condition.<BR> <BR> ESTIMATED BLOOD LOSS:  Minimal.<BR> <BR> SPECIMEN:  Gastric biopsy x2.<BR> <BR> <BR> <BR>  Reviewed on 04/28/2009 3:41 PM<BR> <BR> <BR> <BR> <BR> [name] L. Buffy Ehler, MD<BR> <BR> cc:   Karen Hazard L. Amica Harron, MD<BR> <BR> <BR> <BR> MLB/wmx; Fuller: 04/28/2009 11:52 A; T: 04/28/2009  3:17 P; Doc# [id number]; Job#<BR> [id number]<BR> <!--EPICE-->

## 2009-04-28 NOTE — Progress Notes (Signed)
TRANSFER - OUT REPORT:    Verbal report given to cameron on Karen Fuller  being transferred to 5e for routine progression of care       Report consisted of patient???s Situation, Background, Assessment and   Recommendations(SBAR).     Information from the following report(s) SBAR, Kardex, Procedure Summary, Intake/Output and MAR was reviewed with the receiving nurse.    Opportunity for questions and clarification was provided.

## 2009-04-28 NOTE — Progress Notes (Signed)
Acute Kidney Insufficiency Consult    Subjective:     HPI:    48 yo AA female with severe gastroparesis, S/P gastric pacer earlier today.  (+) ESRD secondary to DM, dialyzes MWF in [place], [place].  No recent issues with dialysis. Stable S/P gastric pacer.    Problem List Date Reviewed: 04/28/2009      Class Noted    Reflux [530.81AM]  01/12/2009        DM (diabetes mellitus) [250.00BV]  01/12/2009        HTN (hypertension) [401.9AF]  01/12/2009        Chronic kidney failure [585.9D]  01/12/2009        Abdominal pain [789.00AP]  01/12/2009        Nausea & vomiting [787.01H]  01/12/2009        Gastroparesis [536.3]  01/12/2009              Past Medical History   Diagnosis Date   ??? Reflux 01/12/2009   ??? DM (diabetes mellitus) 01/12/2009   ??? HTN (hypertension) 01/12/2009   ??? Chronic kidney failure 01/12/2009   ??? Abdominal pain 01/12/2009   ??? Nausea & vomiting 01/12/2009   ??? Gastroparesis 01/12/2009   ??? GERD (gastroesophageal reflux disease)    ??? Renal disease         Past Surgical History   Procedure Date   ??? Hx cesarean section      x2   ??? Hx cholecystectomy        No family history on file.   History   Substance Use Topics   ??? Tobacco Use: Not on file   ??? Alcohol Use:         Allergies   Allergen Reactions   ??? Hydromorphone Shortness of Breath        Prior to Admission medications    Medication Sig Start Date End Date Taking? Authorizing Provider   esomeprazole (NEXIUM) 40 mg capsule Take  by mouth daily.   Yes Historical Provider   promethazine (PHENERGAN) 25 mg tablet Take 25 mg by mouth every six (6) hours as needed.   Yes Historical Provider   aspirin 81 mg chewable tablet Take 81 mg by mouth daily.   Yes Historical Provider   calcium acetate (PHOSLO) 667 mg Cap Take  by mouth three (3) times daily (with meals).   Yes Historical Provider   scopolamine (TRANSDERM-SCOP) 1.5 mg 1 Patch by TransDERmal route every seventy-two (72) hours.   Yes Historical Provider    FOLIC ACID/VITAMIN B COMP W-C (RENA-VITE PO) Take  by mouth.   Yes Historical Provider   meclizine (ANTIVERT) 12.5 mg tablet Take  by mouth three (3) times daily as needed.   Yes Historical Provider   fosinopril-hydrochlorothiazide (MONOPRIL HCT) 20-12.5 mg per tablet Take 1 Tab by mouth daily.   Yes Historical Provider   quetiapine (SEROQUEL) 50 mg tablet Take 50 mg by mouth two (2) times a day.   Yes Historical Provider   citalopram (CELEXA) 10 mg tablet Take  by mouth daily.   Yes Historical Provider   amlodipine (NORVASC) 10 mg tablet Take  by mouth daily.   Yes Historical Provider   carvedilol (COREG) 25 mg tablet Take 25 mg by mouth two (2) times daily (with meals).   Yes Historical Provider   ERGOCALCIFEROL (VITAMIN D PO) Take  by mouth.   Yes Historical Provider   insulin glargine (LANTUS) 100 unit/mL injection by SubCUTAneous route once.   Yes  Historical Provider   insulin aspart (NOVOLOG FLEXPEN) 100 unit/mL flexpen by SubCUTAneous route.   Yes Historical Provider   alprazolam Prudy Feeler) 0.25 mg tablet Take  by mouth nightly as needed.   Yes Historical Provider   clonidine (CATAPRESS) 0.3 mg tablet Take 0.3 mg by mouth two (2) times a day.   Yes Historical Provider   HYDROCODONE BIT/ACETAMINOPHEN (LORTAB PO) Take  by mouth.   Yes Historical Provider   metoclopramide (REGLAN) 10 mg tablet Take 10 mg by mouth four (4) times daily (with meals and at night).    Historical Provider   oxycodone-acetaminophen (PERCOCET) 5-325 mg per tablet Take 1 Tab by mouth every four (4) hours as needed.    Historical Provider   sucralfate (CARAFATE) 1 gram tablet Take 1 g by mouth four (4) times daily.    Historical Provider   pregabalin (LYRICA) 75 mg capsule Take  by mouth.    Historical Provider   nitroglycerin (NITRODUR) 0.4 mg/hr 1 Patch by TransDERmal route daily.    Historical Provider   pantoprazole (PROTONIX) 40 mg tablet Take 40 mg by mouth daily.    Historical Provider       Current facility-administered medications    Medication Dose Route Frequency   ??? HYDROmorphone (PF) (DILAUDID) injection 0.2 mg  0.2 mg IntraVENous Multiple   ??? ondansetron (ZOFRAN) 4 mg/2 mL injection        ??? clindamycin (CLEOCIN) 600 mg IVPB   600 mg IntraVENous NOW   ??? bupivacaine-epinephrine (PF) (SENSORCAINE PF) 0.5 %-1:200,000 injection 150 mg  30 mL Epidural ONCE   ??? amlodipine (NORVASC) tablet 2.5 mg  2.5 mg Oral DAILY   ??? calcium acetate (PHOSLO) capsule 667 mg  1 Cap Oral TID WITH MEALS   ??? carvedilol (COREG) tablet 25 mg  25 mg Oral BID WITH MEALS   ??? citalopram (CELEXA) tablet 10 mg  10 mg Oral DAILY   ??? clonidine (CATAPRES) tablet 0.3 mg  0.3 mg Oral BID   ??? cholecalciferol (vitamin d3) (VITAMIN D3) tablet 1,000 Units  1,000 Units Oral DAILY   ??? oxycodone-acetaminophen (PERCOCET) 5-325 mg per tablet 1 Tab  1 Tab Oral Q4H PRN   ??? quetiapine (SEROQUEL) tablet 50 mg  50 mg Oral BID   ??? dextrose 5% - 0.45% NaCl with KCl 20 mEq/L infusion  25 mL/hr IntraVENous CONTINUOUS   ??? ketorolac (TORADOL) injection 15 mg  15 mg IntraVENous Q6H PRN   ??? morphine injection 2 mg  2 mg IntraVENous Q2H PRN   ??? ondansetron (ZOFRAN ODT) tablet 4 mg  4 mg Oral Q4H PRN   ??? acetaminophen (TYLENOL) tablet 650 mg  650 mg Oral Q4H PRN   ??? diphenhydrAMINE (BENADRYL) capsule 25 mg  25 mg Oral Q4H PRN   ??? metoclopramide (REGLAN) injection 10 mg  10 mg IntraVENous Q6H   ??? clindamycin (CLEOCIN) 600mg  D5W 50mL IVPB  600 mg IntraVENous Q8H   ??? heparin (porcine) injection 5,000 Units  5,000 Units SubCUTAneous Q12H   ??? insulin aspart (NOVOLOG)    SubCUTAneous AC&HS   ??? insulin aspart (NOVOLOG) 100 unit/mL injection        ??? fosinopril (MONOPRIL) tablet 20 mg  20 mg Oral DAILY   ??? prochlorperazine (COMPAZINE) with saline injection 5 mg  5 mg IntraVENous Q6H PRN   ??? nitroglycerin (NITRODUR) 0.4 mg/hr patch 1 Patch  1 Patch TransDERmal DAILY   ??? DISCONTD: lactated ringers infusion    IntraVENous CONTINUOUS   ???  DISCONTD: 0.9% sodium chloride infusion  25 mL/hr IntraVENous CONTINUOUS    ??? DISCONTD: lidocaine (PF) (XYLOCAINE) 10 mg/mL (1 %) injection Soln 0.1 mL  0.1 mL SubCUTAneous PRN   ??? DISCONTD: fentanyl citrate (pf) injection 50 mcg  50 mcg IntraVENous PRN   ??? DISCONTD: midazolam (VERSED) injection 1 mg  1 mg IntraVENous PRN   ??? DISCONTD: lactated ringers infusion    IntraVENous CONTINUOUS   ??? DISCONTD: fentanyl citrate (pf) injection 25 mcg  25 mcg IntraVENous Multiple   ??? DISCONTD: morphine injection 2 mg  2 mg IntraVENous Multiple   ??? DISCONTD: ondansetron (ZOFRAN) injection 4 mg  4 mg IntraVENous PRN   ??? DISCONTD: dexamethasone (DECADRON) 4 mg/mL injection 4 mg  4 mg IntraVENous 1 TIME PRN   ??? DISCONTD: droperidol (INAPSINE) injection 0.625 mg  0.625 mg IntraVENous PRN   ??? DISCONTD: meperidine (DEMEROL) injection 12.5 mg  12.5 mg IntraVENous ONCE   ??? DISCONTD: sodium chloride (NS) 0.9 % flush        ??? DISCONTD: ondansetron (ZOFRAN) injection 4 mg  4 mg IntraVENous ONCE   ??? DISCONTD: fosinopril-hydrochlorothiazide (MONOPRIL-HCT) 20-12.5 mg per tablet Tab 1 Tab  1 Tab Oral DAILY   ??? DISCONTD: nitroglycerin (NITRODUR) 0.4 mg/hr patch 1 Patch  1 Patch TransDERmal DAILY   ??? DISCONTD: promethazine (PHENERGAN) injection 12.5 mg  12.5 mg IntraVENous Q6H PRN   ??? DISCONTD: hydrochlorothiazide (HYDRODIURIL) tablet 12.5 mg  12.5 mg Oral DAILY         Review of Systems:  A comprehensive review of systems was negative except for that written in the HPI.    Objective:     Patient Vitals in the past 8 hrs:   BP Temp Pulse Resp SpO2   04/28/09 1505 156/88 mmHg - 65  17  96 %   04/28/09 1415 129/78 mmHg - 83  11  97 %   04/28/09 1400 136/76 mmHg - 83  12  99 %   04/28/09 1345 145/75 mmHg - 83  12  98 %   04/28/09 1335 - - - 14  98 %   04/28/09 1330 150/78 mmHg - 83  11  98 %   04/28/09 1315 173/84 mmHg - 86  8  97 %   04/28/09 1300 170/89 mmHg - 85  15  100 %   04/28/09 1245 157/79 mmHg - 82  14  99 %   04/28/09 1230 166/84 mmHg - 85  14  98 %   04/28/09 1225 160/83 mmHg - 84  13  97 %    04/28/09 1220 158/80 mmHg - 85  15  98 %   04/28/09 1215 158/82 mmHg - 86  15  98 %   04/28/09 1205 151/83 mmHg - 86  18  97 %   04/28/09 1200 158/79 mmHg 98.4 ??F (36.9 ??C) 86  16  96 %   04/28/09 1155 149/76 mmHg - 86  17  96 %        Temp (24hrs), Avg:98.6 ??F (37 ??C), Min:98.4 ??F (36.9 ??C), Max:98.8 ??F (37.1 ??C)    Intake and Output:     In: 450 (450 I.V.)  Out: 210     Physical Exam:  Neck: supple, symmetrical, trachea midline, no adenopathy and thyroid: not enlarged, symmetric, no tenderness/mass/nodules  Lungs: clear to auscultation bilaterally  Heart: regular rate and rhythm, S1, S2 normal, no murmur, click, rub or gallop  Abdomen: soft, non-tender. Bowel sounds normal. No masses,  no organomegaly  Extremities: extremities normal, atraumatic, no cyanosis or edema  Skin: Skin color, texture, turgor normal. No rashes or lesions  Neurologic: Grossly normal  Good thrill left forearm AVG    Data Review: CBC:   Lab Results   Component Value Date/Time    WBC 10.0 04/28/2009 12:25 PM    RBC 4.04 04/28/2009 12:25 PM    HGB 11.6 04/28/2009 12:25 PM    HCT 34.9 04/28/2009 12:25 PM    PLATELET 226 04/28/2009 12:25 PM     , BMP:   Lab Results   Component Value Date/Time    Glucose 232 04/28/2009 12:25 PM    Sodium 134 04/28/2009 12:25 PM    Potassium 4.8 04/28/2009 12:25 PM    Chloride 94 04/28/2009 12:25 PM    CO2 26 04/28/2009 12:25 PM    BUN 39 04/28/2009 12:25 PM    Creatinine 7.5 04/28/2009 12:25 PM    Calcium 8.5 04/28/2009 12:25 PM             Assessment:     -ESRD secondary to diabetes - MWF dialysis  -Gastroparesis, S/P gastric pacer 04/28/09  -HTN  -diabetes  -GERD  Patient Active Hospital Problem List:   * No active hospital problems. *       Plan:     -Dialysis tomorrow  -Serial labs  BP control    Thank you.  Will follow.      Signed By: Eartha Inch, MD                       April 28, 2009

## 2009-04-28 NOTE — Op Note (Signed)
Name: Karen Fuller, Karen Fuller  MR #: [id number] Surgeon: Katina Dung. Geronimo Boot,   MD  Account #: [id number] Surgery Date: 04/28/2009  DOB: Jul 21, 1960  Age: 48 Location: [id number]     OPERATIVE REPORT      PREOPERATIVE DIAGNOSES  1. 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  2. Diffuse gastritis.    POSTOPERATIVE DIAGNOSES  1. 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  2. Diffuse gastritis.    OPERATIVE PROCEDURE  1. Laparoscopic placement of Enterra gastric electrical stimulation leads  x2.  2. Placement of Enterra generator.  3. Intraoperative programming.  4. Intraoperative endoscopy with biopsy.    SURGEON: Katina Dung. Geronimo Boot, MD    ASSISTANT: Rolm Bookbinder, PA-C    ANESTHESIA: General endotracheal anesthesia.    COMPLICATIONS: None.    SPONGE AND NEEDLE COUNTS: Correct at the end of the case.    INDICATIONS FOR PROCEDURE: A 48 year old African American female, severe  insulin-dependent diabetic for many, many years with the diabetic  complications of end-stage renal disease, neuropathy, and diabetic  gastroparesis, now with multiple hospitalization secondary to profuse  nausea and vomiting and general intolerance to p.o., now for placement of  Enterra generator.    PROCEDURE: The patient was taken to the operating room and placed on the  operating table in supine position. After the uneventful induction of  general endotracheal anesthesia, the abdomen was prepped with ChloraPrep,  draped with sterile towels, and a sterile drape. A supraumbilical incision  was made and carried down in a nonbladed 5-mm direct viewing trocar and  placed into the abdominal cavity using a 30-degree, 5 mm laparoscope. The  obturator was removed. The abdomen was fully insufflated. Additional 5-mm  trocar was placed on the patient's right side then a 12-mm trocar in the  left upper quadrant. There were some adhesions at the patient's previous   J-tube site and at the low midline. These were not disturbed to perform the  operation. The greater curve of the stomach was easily identified as was  the pylorus. A site 10 cm proximal to the pylorus was marked on the  anterior surface of the stomach using Bovie cautery. Two prepared Enterra  leads were placed into the abdominal cavity. The ski needle of each was  used to create an intramural tunnel from the 10-cm mark toward the pylorus,  approximately 1 cm apart and parallel. The blue Prolene sutures were  brought into the intramural tunnels. Intraoperative endoscopy was then  performed by Dr. Geronimo Boot by placing the GF-160 endoscope into the  oropharynx, traversed the upper esophageal sphincter with jaw thrust, down  the esophagus into the stomach. Upon entry into the stomach, the stomach  was diffusely cobblestoned with obvious erythema consistent with gastritis,  so biopsies x2 were taken in the body of the stomach, away from the site  where the generator leads were to be placed. Inspection of the lead  locations showed no perforation of the mucosa and no dimpling of the mucosa  in the area of the leads. The sites of biopsy were inspected. Hemostasis  was achieved. Thus, air was removed from the stomach and the scope  eventually retrieved at the end of the case.    The surgeon regowned and regloved. The electrical terminal portions of the  leads were brought into their intramural tunnels. Silicone disks were  placed on each of the Prolene sutures and secured with Lapra-Ty. Each of  the 4 silk sutures on the flanges of the leads were then  sewn to the  anterior abdominal wall and tied intracorporeally so that they were secure  and the terminal portions and leads were completely within the intramural  tunnel. The generator terminals and the leads were then brought out to the  12-mm trocar site. The abdomen was desufflated and the trocar was removed.   A suprafascial subcutaneous pocket was created for the generator and  inspected for hemostasis using Bovie cautery. A generator was brought up  onto the field. This was connected to the generator lead terminals. These  were hand tightened with the screwdriver. The excess lead wire was coiled  into the pocket and the generator placed into the pocket. The generator was  then interrogated. The impedance across the leads was 579 ohms. The  generator was then secured to the fascia in 2 locations using 2-0 Prolene  suture. The wounds were inspected for hemostasis.    The generator was programmed at 7 volts, 14 Hz, 330 msec pulse width, 0.1  second on, 5 second off time. After the wounds were closed and sealed with  Dermabond skin sealant, the generator was placed in the on position.    The patient tolerated the procedure well, was awake, alert, and extubated  in the operating room and taken to the recovery room in good condition.    ESTIMATED BLOOD LOSS: Minimal.    SPECIMEN: Gastric biopsy x2.        Reviewed on 04/28/2009 3:41 PM          Molli Hazard L. Geronimo Boot, MD    cc: Katina Dung. Geronimo Boot, MD        MLB/wmx; D: 04/28/2009 11:52 A; T: 04/28/2009 3:17 P; Doc# [id number]; Job#  [id number]

## 2009-04-28 NOTE — Brief Op Note (Signed)
BRIEF OPERATIVE NOTE    Date of Procedure: 04/28/2009   Preoperative Diagnosis: GASTROPARESIS  Postoperative Diagnosis: GASTROPARESIS    Procedure:  GASTRIC STIMULATOR INSERTION LAPAROSCOPIC - INSERTION LAPAROSCOPIC LEADS X 2, PLACEMENT OF ENTERRA GENERATOR,  INTRAOPERATIVE PROGRAMMING, EGD WITH BIOPSIES    Surgeon: Katina Dung. Geronimo Boot, MD  Assistant(s): [facility]   Anesthesia: General   Estimated Blood Loss: None  Specimens:   ID Type Source Tests Collected by Time Destination   1 : stomach biopsy Fresh Stomach  Geronimo Boot, Janzen Sacks 04/28/2009 1107 Pathology      Findings: See full operative note.  Complications: None  Implants: * No implants in log *

## 2009-04-28 NOTE — H&P (Signed)
History and Physical   Subjective:    Karen Fuller is a 48 y.o.female with a history of gastroparesis for over 2 years. .   . She has nausea/vomiting every other day with chronic abd pain. She has been hospitalized almost weekly in August and Sept for off and on for 5 weeks. She has been hospitalized 2 times in the past 3 weeks for nausea, vomiting and Abd pain. She states that she can eat normally when she does not have the pain. When she has pain she can only tolerate clear liquids. She states that her pain is all LUQ pain.   Past Medical History    Diagnosis  Date    ???  Reflux  01/12/2009    ???  DM (diabetes mellitus)  01/12/2009    ???  HTN (hypertension)  01/12/2009    ???  Chronic kidney failure  01/12/2009    ???  Abdominal pain  01/12/2009    ???  Nausea & vomiting  01/12/2009    ???  Gastroparesis  01/12/2009    ???  GERD (gastroesophageal reflux disease)     ???  Renal disease       Past Surgical History    Procedure  Date    ???  Hx cesarean section       x2    ???  Hx cholecystectomy       No family history on file.   History    Substance Use Topics    ???  Tobacco Use:  Not on file    ???  Alcohol Use:       Prior to Admission medications    Medication  Sig  Start Date  End Date  Taking?  Authorizing Provider    esomeprazole (NEXIUM) 40 mg capsule  Take by mouth daily.    Yes  Historical Provider    promethazine (PHENERGAN) 25 mg tablet  Take 25 mg by mouth every six (6) hours as needed.    Yes  Historical Provider    metoclopramide (REGLAN) 10 mg tablet  Take 10 mg by mouth four (4) times daily (with meals and at night).    Yes  Historical Provider    aspirin 81 mg chewable tablet  Take 81 mg by mouth daily.    Yes  Historical Provider    oxycodone-acetaminophen (PERCOCET) 5-325 mg per tablet  Take 1 Tab by mouth every four (4) hours as needed.    Yes  Historical Provider    calcium acetate (PHOSLO) 667 mg Cap  Take by mouth three (3) times daily (with meals).    Yes  Historical Provider     scopolamine (TRANSDERM-SCOP) 1.5 mg  1 Patch by TransDERmal route every seventy-two (72) hours.    Yes  Historical Provider    sucralfate (CARAFATE) 1 gram tablet  Take 1 g by mouth four (4) times daily.    Yes  Historical Provider    FOLIC ACID/VITAMIN B COMP W-C (RENA-VITE PO)  Take by mouth.    Yes  Historical Provider    quetiapine (SEROQUEL) 50 mg tablet  Take 50 mg by mouth two (2) times a day.    Yes  Historical Provider    citalopram (CELEXA) 10 mg tablet  Take by mouth daily.    Yes  Historical Provider    carvedilol (COREG) 25 mg tablet  Take 25 mg by mouth two (2) times daily (with meals).    Yes  Historical Provider  insulin glargine (LANTUS) 100 unit/mL injection  by SubCUTAneous route once.    Yes  Historical Provider    insulin aspart (NOVOLOG FLEXPEN) 100 unit/mL flexpen  by SubCUTAneous route.    Yes  Historical Provider    alprazolam Prudy Feeler) 0.25 mg tablet  Take by mouth nightly as needed.    Yes  Historical Provider    pregabalin (LYRICA) 75 mg capsule  Take by mouth.     Historical Provider    meclizine (ANTIVERT) 12.5 mg tablet  Take by mouth three (3) times daily as needed.     Historical Provider    fosinopril-hydrochlorothiazide (MONOPRIL HCT) 20-12.5 mg per tablet  Take 1 Tab by mouth daily.     Historical Provider    nitroglycerin (NITRODUR) 0.4 mg/hr  1 Patch by TransDERmal route daily.     Historical Provider    amlodipine (NORVASC) 10 mg tablet  Take by mouth daily.     Historical Provider    ERGOCALCIFEROL (VITAMIN D PO)  Take by mouth.     Historical Provider    pantoprazole (PROTONIX) 40 mg tablet  Take 40 mg by mouth daily.     Historical Provider    clonidine (CATAPRESS) 0.3 mg tablet  Take 0.3 mg by mouth two (2) times a day.     Historical Provider    HYDROCODONE BIT/ACETAMINOPHEN (LORTAB PO)  Take by mouth.     Historical Provider      Allergies    Allergen  Reactions    ???  Hydromorphone  Shortness of Breath      Review of Systems:    Significant for nausea, vomiting and Abd pain.   Objective:    Intake and Output:       Physical Exam:   BP 114/60   Pulse 83   Temp 97.5 ??F (36.4 ??C)   Ht 5\' 2"  (1.575 m)   Wt 239 lb 8 oz (108.636 kg)   SpO2 97%   General appearance: alert, cooperative, no distress, appears stated age   Lungs: clear to auscultation bilaterally   Heart: regular rate and rhythm, S1, S2 normal, no murmur, click, rub or gallop   Abdomen: soft, non-tender. Bowel sounds normal. No masses, no organomegaly   Skin: Skin color, texture, turgor normal. No rashes or lesions   Assessment:    48 year old female with 2 year history of gastroparesis   Plan:    Enterra placement. Consent was signed. All patients questions were answered at the time of the visit by Dr. Geronimo Boot.   Patient is ready to proceed with surgery.   Signed By:  Rae Roam Leonor Liv, NP     April 18, 2009      Katina Dung. Geronimo Boot, MD 04/18/09 11:05 PM Addended   Addended byAreta Haber on: 04/18/2009   Modules accepted: Orders  Date of Surgery Update:  Karen Fuller was seen and examined.  There have been no significant clinical changes since the completion of the above History and Physical.    Signed By: Katina Dung. Geronimo Boot, MD     April 28, 2009 8:19 AM

## 2009-04-28 NOTE — Progress Notes (Signed)
Anesthesia notified of N/V. Medicated. Bedrails up.

## 2009-04-29 LAB — CBC WITH AUTOMATED DIFF
ABS. BASOPHILS: 0 10*3/uL (ref 0.0–0.1)
ABS. EOSINOPHILS: 0.3 10*3/uL (ref 0.0–0.4)
ABS. LYMPHOCYTES: 2.2 10*3/uL (ref 0.8–3.5)
ABS. MONOCYTES: 0.7 10*3/uL (ref 0.0–1.0)
ABS. NEUTROPHILS: 5.3 10*3/uL (ref 1.8–8.0)
BASOPHILS: 0 % (ref 0–1)
EOSINOPHILS: 3 % (ref 0–7)
HCT: 31 % — ABNORMAL LOW (ref 35.0–47.0)
HGB: 10.2 g/dL — ABNORMAL LOW (ref 11.5–16.0)
LYMPHOCYTES: 26 % (ref 12–49)
MCH: 28.6 PG (ref 26.0–34.0)
MCHC: 32.9 g/dL (ref 30.0–36.5)
MCV: 86.8 FL (ref 80.0–99.0)
MONOCYTES: 8 % (ref 5–13)
NEUTROPHILS: 63 % (ref 32–75)
PLATELET: 213 10*3/uL (ref 150–400)
RBC: 3.57 M/uL — ABNORMAL LOW (ref 3.80–5.20)
RDW: 14.5 % (ref 11.5–14.5)
WBC: 8.5 10*3/uL (ref 3.6–11.0)

## 2009-04-29 LAB — PHOSPHORUS: Phosphorus: 7.1 MG/DL — ABNORMAL HIGH (ref 2.5–4.9)

## 2009-04-29 LAB — METABOLIC PANEL, COMPREHENSIVE
A-G Ratio: 0.8 — ABNORMAL LOW (ref 1.1–2.2)
ALT (SGPT): 24 U/L (ref 12–78)
AST (SGOT): 13 U/L — ABNORMAL LOW (ref 15–37)
Albumin: 3 g/dL — ABNORMAL LOW (ref 3.5–5.0)
Alk. phosphatase: 150 U/L — ABNORMAL HIGH (ref 50–136)
Anion gap: 16 mmol/L — ABNORMAL HIGH (ref 5–15)
BUN/Creatinine ratio: 6 — ABNORMAL LOW (ref 12–20)
BUN: 50 MG/DL — ABNORMAL HIGH (ref 6–20)
Bilirubin, total: 0.5 MG/DL (ref 0.2–1.0)
CO2: 27 MMOL/L (ref 21–32)
Calcium: 8.3 MG/DL — ABNORMAL LOW (ref 8.5–10.1)
Chloride: 93 MMOL/L — ABNORMAL LOW (ref 97–108)
Creatinine: 8.9 MG/DL — ABNORMAL HIGH (ref 0.6–1.3)
GFR est AA: 6 mL/min/{1.73_m2} — ABNORMAL LOW (ref >60)
GFR est non-AA: 5 mL/min/{1.73_m2} — ABNORMAL LOW (ref >60)
Globulin: 3.8 g/dL (ref 2.0–4.0)
Glucose: 94 MG/DL (ref 65–100)
Potassium: 4.1 MMOL/L (ref 3.5–5.1)
Protein, total: 6.8 g/dL (ref 6.4–8.2)
Sodium: 136 MMOL/L (ref 136–145)

## 2009-04-29 LAB — GLUCOSE, POC
Glucose (POC): 169 mg/dL — ABNORMAL HIGH (ref 65–105)
Glucose (POC): 137 mg/dL — ABNORMAL HIGH (ref 65–105)
Glucose (POC): 107 mg/dL — ABNORMAL HIGH (ref 65–105)
Glucose (POC): 152 mg/dL — ABNORMAL HIGH (ref 65–105)

## 2009-04-29 LAB — MAGNESIUM: Magnesium: 2 MG/DL (ref 1.6–2.4)

## 2009-04-29 MED ORDER — SODIUM CHLORIDE 0.9 % IJ SYRG
INTRAMUSCULAR | Status: AC
Start: 2009-04-29 — End: 2009-04-29
  Administered 2009-04-29: 10:00:00 via INTRAVENOUS

## 2009-04-29 MED ORDER — OXYCODONE-ACETAMINOPHEN 5 MG-325 MG TAB
5-325 mg | ORAL_TABLET | ORAL | Status: DC | PRN
Start: 2009-04-29 — End: 2010-03-28

## 2009-04-29 MED FILL — CLONIDINE 0.2 MG TAB: 0.2 mg | ORAL | Qty: 1

## 2009-04-29 MED FILL — NITROGLYCERIN 0.4 MG/HR TRANSDERM 24 HR PATCH: 0.4 mg/hr | TRANSDERMAL | Qty: 1

## 2009-04-29 MED FILL — METOCLOPRAMIDE 5 MG/ML IJ SOLN: 5 mg/mL | INTRAMUSCULAR | Qty: 2

## 2009-04-29 MED FILL — CLEOCIN 600 MG/50 ML IN 5 % DEXTROSE INTRAVENOUS PIGGYBACK: 600 mg/50 mL | INTRAVENOUS | Qty: 50

## 2009-04-29 MED FILL — MORPHINE 2 MG/ML INJECTION: 2 mg/mL | INTRAMUSCULAR | Qty: 1

## 2009-04-29 MED FILL — INSULIN ASPART 100 UNIT/ML INJECTION: 100 unit/mL | SUBCUTANEOUS | Qty: 1

## 2009-04-29 MED FILL — SEROQUEL 25 MG TABLET: 25 mg | ORAL | Qty: 2

## 2009-04-29 MED FILL — HEPARIN (PORCINE) 5,000 UNIT/ML IJ SOLN: 5000 unit/mL | INTRAMUSCULAR | Qty: 1

## 2009-04-29 MED FILL — CALCIUM ACETATE 667 MG CAP: 667 mg | ORAL | Qty: 1

## 2009-04-29 MED FILL — CITALOPRAM 20 MG TAB: 20 mg | ORAL | Qty: 1

## 2009-04-29 MED FILL — CARVEDILOL 12.5 MG TAB: 12.5 mg | ORAL | Qty: 2

## 2009-04-29 MED FILL — BD POSIFLUSH NORMAL SALINE 0.9 % INJECTION SYRINGE: INTRAMUSCULAR | Qty: 10

## 2009-04-29 MED FILL — VITAMIN D3 25 MCG (1,000 UNIT) TABLET: 25 mcg (1,000 unit) | ORAL | Qty: 1

## 2009-04-29 NOTE — Procedures (Signed)
[facility] [phone])  Pre HD Vitals: BP 127/82, HR 81, T 97.9, R 20. LOC: A&ox3, Respiratory: clear, Cardiac: regular, Skin: dry and warm, Edema: generalized.  Post HD Vitals: BP 96/58 , HR80  T 97.8, R . LOC: A&ox3, Respiratory: clear, Cardiac: regular, Skin: dry and warm, Edema: generalized.  Access: Lt. Forearm AVG, +thrill/bruit. No S/S infection. Cannulated with 15G needlesx2 without difficulty. QB 450. Site held for arterial   Mins, venous mins.  TFR:3.3   FG: 0.5  NFR:2.7   LP 85.2.  Patient dialyzed 3hr as ordered. Patient tolerated treatment well. No S/S distress. No medications. No pre and post lab orders. Patient stable post treatment. Report and care turned over to primary nurse.

## 2009-04-29 NOTE — Progress Notes (Signed)
Followup for ESRD. She feels pretty good post op. No dyspnea. No swelling  No new c/o.    Chest clear. No jvd. Regular heart. No peripheral edema. Access OK.    No changes for now. Dialysis support. Her estimated dry weight is 104, now at 108.6 "but I cramp if they try to take off more than 5000."  LABS: Recent Results (from the past 24 hour(s))   GLUCOSE, POC    Collection Time    04/28/09  9:17 AM   Component Value Range   ??? POC GLUCOSE 224 (*) 65 - 105 (mg/dL)   CBC WITH AUTOMATED DIFF    Collection Time    04/28/09 12:25 PM   Component Value Range   ??? WBC 10.0  3.6 - 11.0 (K/uL)   ??? RBC 4.04  3.80 - 5.20 (M/uL)   ??? HGB 11.6  11.5 - 16.0 (g/dL)   ??? HCT 34.9 (*) 35.0 - 47.0 (%)   ??? MCV 86.4  80.0 - 99.0 (FL)   ??? MCH 28.7  26.0 - 34.0 (PG)   ??? MCHC 33.2  30.0 - 36.5 (g/dL)   ??? RDW 14.4  11.5 - 14.5 (%)   ??? PLATELET 226  150 - 400 (K/uL)   ??? NEUTROPHILS 82 (*) 32 - 75 (%)   ??? LYMPHOCYTES 13  12 - 49 (%)   ??? MONOCYTES 4 (*) 5 - 13 (%)   ??? EOSINOPHILS 1  0 - 7 (%)   ??? BASOPHILS 0  0 - 1 (%)   ??? ABSOLUTE NEUTS 8.2 (*) 1.8 - 8.0 (K/UL)   ??? ABSOLUTE LYMPHS 1.3  0.8 - 3.5 (K/UL)   ??? ABSOLUTE MONOS 0.4  0.0 - 1.0 (K/UL)   ??? ABSOLUTE EOSINS 0.1  0.0 - 0.4 (K/UL)   ??? ABSOLUTE BASOS 0.0  0.0 - 0.1 (K/UL)   METABOLIC PANEL, BASIC    Collection Time    04/28/09 12:25 PM   Component Value Range   ??? Sodium 134 (*) 136 - 145 (MMOL/L)   ??? Potassium 4.8  3.5 - 5.1 (MMOL/L)   ??? Chloride 94 (*) 97 - 108 (MMOL/L)   ??? CO2 26  21 - 32 (MMOL/L)   ??? Anion gap 14  5 - 15 (mmol/L)   ??? Glucose 232 (*) 65 - 100 (MG/DL)   ??? BUN 39 (*) 6 - 20 (MG/DL)   ??? Creatinine 7.5 (*) 0.6 - 1.3 (MG/DL)   ??? BUN/Creatinine ratio 5 (*) 12 - 20 ( )   ??? GFR est AA 8 (*) >60 (ml/min/1.32m2)   ??? GFR est non-AA 6 (*) >60 (ml/min/1.72m2)   ??? Calcium 8.5  8.5 - 10.1 (MG/DL)   GLUCOSE, POC    Collection Time    04/28/09  1:12 PM   Component Value Range   ??? POC GLUCOSE 237 (*) 65 - 105 (mg/dL)   GLUCOSE, POC    Collection Time    04/28/09  5:18 PM    Component Value Range   ??? POC GLUCOSE 122 (*) 65 - 105 (mg/dL)   GLUCOSE, POC    Collection Time    04/28/09  9:33 PM   Component Value Range   ??? POC GLUCOSE 169 (*) 65 - 105 (mg/dL)   METABOLIC PANEL, COMPREHENSIVE    Collection Time    04/29/09  4:53 AM   Component Value Range   ??? Sodium 136  136 - 145 (MMOL/L)   ??? Potassium 4.1  3.5 -  5.1 (MMOL/L)   ??? Chloride 93 (*) 97 - 108 (MMOL/L)   ??? CO2 27  21 - 32 (MMOL/L)   ??? Anion gap 16 (*) 5 - 15 (mmol/L)   ??? Glucose 94  65 - 100 (MG/DL)   ??? BUN 50 (*) 6 - 20 (MG/DL)   ??? Creatinine 8.9 (*) 0.6 - 1.3 (MG/DL)   ??? BUN/Creatinine ratio 6 (*) 12 - 20 ( )   ??? GFR est AA 6 (*) >60 (ml/min/1.56m2)   ??? GFR est non-AA 5 (*) >60 (ml/min/1.30m2)   ??? Calcium 8.3 (*) 8.5 - 10.1 (MG/DL)   ??? Bilirubin, total 0.5  0.2 - 1.0 (MG/DL)   ??? ALT 24  12 - 78 (U/L)   ??? AST 13 (*) 15 - 37 (U/L)   ??? Alk. phosphatase 150 (*) 50 - 136 (U/L)   ??? Protein, total 6.8  6.4 - 8.2 (g/dL)   ??? Albumin 3.0 (*) 3.5 - 5.0 (g/dL)   ??? Globulin 3.8  2.0 - 4.0 (g/dL)   ??? A-G Ratio 0.8 (*) 1.1 - 2.2 ( )   MAGNESIUM    Collection Time    04/29/09  4:53 AM   Component Value Range   ??? Magnesium 2.0  1.6 - 2.4 (MG/DL)   PHOSPHORUS    Collection Time    04/29/09  4:53 AM   Component Value Range   ??? Phosphorus 7.1 (*) 2.5 - 4.9 (MG/DL)   CBC WITH AUTOMATED DIFF    Collection Time    04/29/09  4:53 AM   Component Value Range   ??? WBC 8.5  3.6 - 11.0 (K/uL)   ??? RBC 3.57 (*) 3.80 - 5.20 (M/uL)   ??? HGB 10.2 (*) 11.5 - 16.0 (g/dL)   ??? HCT 31.0 (*) 35.0 - 47.0 (%)   ??? MCV 86.8  80.0 - 99.0 (FL)   ??? MCH 28.6  26.0 - 34.0 (PG)   ??? MCHC 32.9  30.0 - 36.5 (g/dL)   ??? RDW 14.5  11.5 - 14.5 (%)   ??? PLATELET 213  150 - 400 (K/uL)   ??? NEUTROPHILS 63  32 - 75 (%)   ??? LYMPHOCYTES 26  12 - 49 (%)   ??? MONOCYTES 8  5 - 13 (%)   ??? EOSINOPHILS 3  0 - 7 (%)   ??? BASOPHILS 0  0 - 1 (%)   ??? ABSOLUTE NEUTS 5.3  1.8 - 8.0 (K/UL)   ??? ABSOLUTE LYMPHS 2.2  0.8 - 3.5 (K/UL)   ??? ABSOLUTE MONOS 0.7  0.0 - 1.0 (K/UL)    ??? ABSOLUTE EOSINS 0.3  0.0 - 0.4 (K/UL)   ??? ABSOLUTE BASOS 0.0  0.0 - 0.1 (K/UL)   GLUCOSE, POC    Collection Time    04/29/09  6:48 AM   Component Value Range   ??? POC GLUCOSE 137 (*) 65 - 105 (mg/dL)

## 2009-04-29 NOTE — Progress Notes (Signed)
Faculty or Preceptor Review of Student Work    04/29/2009  - Shift times - 0730 to 1215    The student documentation of patient care for Karen Fuller has been reviewed and approved.  All medications have been administered under the direct supervision of the faculty or preceptor.    Norris Cross. Alen Bleacher, RN

## 2009-04-29 NOTE — Procedures (Signed)
[facility] ([id number])  Pre HD Vitals: BP 127/82, HR 81, T 97.9, R 20. LOC: A&ox3, Respiratory: clear, Cardiac: regular, Skin: dry and warm, Edema: generalized.  Post HD Vitals: BP 96/58 , HR80  T 97.8, R . LOC: A&ox3, Respiratory: clear, Cardiac: regular, Skin: dry and warm, Edema: generalized.  Access: Lt. Forearm AVG, +thrill/bruit. No S/S infection. Cannulated with 15G needlesx2 without difficulty. QB 450. Site held for arterial   Mins, venous mins.  TFR:3.3   FG: 0.5  NFR:2.7   LP 85.2.  Patient dialyzed 3hr 30mins as ordered. Patient tolerated treatment well. No S/S distress. No medications. No pre and post lab orders. Patient stable post treatment. Report and care turned over to primary nurse.

## 2009-04-29 NOTE — Progress Notes (Signed)
Surgery Progress Note    Admit Date: 04/28/2009      Subjective:       Pt is receiving dialysis a the bedside currently.  Pt complains of pain and just some local soreness around stim site.  Her nausea is much improved and she tolerated her breakfast well without vomiting. .  No SOB. No CP.  Ambulating.  Current diet is soft. no fever or chills. No void yet since procedure.    Objective:     Patient Vitals in the past 8 hrs:   BP Temp Pulse Resp SpO2   04/29/09 0819 113/75 mmHg 98.1 ??F (36.7 ??C) 82  16  96 %   04/29/09 0456 114/52 mmHg 98.3 ??F (36.8 ??C) 63  16  92 %          In: 1055 (180 [address]75 I.V.)  Out: 210 ip  Physical Exam:    General: alert, cooperative, no distress, appears stated age  Cardiac: normal S1 and S2  Lungs: Normal chest wall and respirations. Clear to auscultation.  Abdomen: soft, nondistended, tenderness mild - in the LUQ  Wounds:clean, dry, no drainage  Neuro: alert, oriented x 3, no defects noted in general exam.  Extremities: extremities normal, atraumatic, no cyanosis or edema      CBC: Lab Results   Component Value Date/Time    WBC 8.5 04/29/2009  4:53 AM    RBC 3.57 04/29/2009  4:53 AM    HGB 10.2 04/29/2009  4:53 AM    HCT 31.0 04/29/2009  4:53 AM    PLATELET 213 04/29/2009  4:53 AM       BMP: Lab Results   Component Value Date/Time    Glucose 94 04/29/2009  4:53 AM    Sodium 136 04/29/2009  4:53 AM    Potassium 4.1 04/29/2009  4:53 AM    Chloride 93 04/29/2009  4:53 AM    CO2 27 04/29/2009  4:53 AM    BUN 50 04/29/2009  4:53 AM    Creatinine 8.9 04/29/2009  4:53 AM    Calcium 8.3 04/29/2009  4:53 AM       CMP:Lab Results   Component Value Date/Time    Glucose 94 04/29/2009  4:53 AM    Sodium 136 04/29/2009  4:53 AM    Potassium 4.1 04/29/2009  4:53 AM    Chloride 93 04/29/2009  4:53 AM    CO2 27 04/29/2009  4:53 AM    BUN 50 04/29/2009  4:53 AM    Creatinine 8.9 04/29/2009  4:53 AM    Calcium 8.3 04/29/2009  4:53 AM    Anion gap 16 04/29/2009  4:53 AM     BUN/Creatinine ratio 6 04/29/2009  4:53 AM    Bilirubin, total 0.5 04/29/2009  4:53 AM    ALT 24 04/29/2009  4:53 AM    Alk. phosphatase 150 04/29/2009  4:53 AM    Protein, total 6.8 04/29/2009  4:53 AM    Albumin 3.0 04/29/2009  4:53 AM    Globulin 3.8 04/29/2009  4:53 AM    A-G Ratio 0.8 04/29/2009  4:53 AM         Radiology review: NA        Assessment:   Pt is POD #1 s/p  GASTRIC STIMULATOR INSERTION LAPAROSCOPIC - INSERTION LAPAROSCOPIC LEADS X 2, PLACEMENT OF ENTERRA GENERATOR,  INTRAOPERATIVE PROGRAMMING, EGD WITH BIOPSIES    Plan:   Diet: advance as tolerated  Activity: out of bed and ambulate  Pain  management  GI and DVT prophylaxis  Labs: labs are reviewed,   CRF on dialysis  No void, bladder scan .    D/C planning.  Further plan per Dr. Candiss Norse, PA

## 2009-04-29 NOTE — Progress Notes (Signed)
Problem: Patient Education: Go to Patient Education Activity  Goal: Patient/Family Education  Educated patient and family member on gastroparesis diet. Reviewed handout. Discussed briefly renal diet.  Can follow up for additional questions prn.

## 2009-04-29 NOTE — Progress Notes (Signed)
I interviewed and examined the patient.  I have confirmed the history and exam findings as noted by the Physician Assistant.    Feels well  No n/v  tol clears  Just finished dialysis  C/o pain at pacer site  abd nl bs soft nd    A/p  Doing well  As pt form [place] she desires to eat lunch and assess her condition.  If she feels well the she may be d/cd tonite.  If she is wary then she will go home in the am

## 2009-04-30 LAB — CBC WITH AUTOMATED DIFF
ABS. BASOPHILS: 0 10*3/uL (ref 0.0–0.1)
ABS. EOSINOPHILS: 0.3 10*3/uL (ref 0.0–0.4)
ABS. LYMPHOCYTES: 1.6 10*3/uL (ref 0.8–3.5)
ABS. MONOCYTES: 0.8 10*3/uL (ref 0.0–1.0)
ABS. NEUTROPHILS: 5.3 10*3/uL (ref 1.8–8.0)
BASOPHILS: 0 % (ref 0–1)
EOSINOPHILS: 4 % (ref 0–7)
HCT: 32 % — ABNORMAL LOW (ref 35.0–47.0)
HGB: 10.4 g/dL — ABNORMAL LOW (ref 11.5–16.0)
LYMPHOCYTES: 20 % (ref 12–49)
MCH: 28.3 PG (ref 26.0–34.0)
MCHC: 32.5 g/dL (ref 30.0–36.5)
MCV: 87 FL (ref 80.0–99.0)
MONOCYTES: 10 % (ref 5–13)
NEUTROPHILS: 66 % (ref 32–75)
PLATELET: 189 10*3/uL (ref 150–400)
RBC: 3.68 M/uL — ABNORMAL LOW (ref 3.80–5.20)
RDW: 14.6 % — ABNORMAL HIGH (ref 11.5–14.5)
WBC: 8 10*3/uL (ref 3.6–11.0)

## 2009-04-30 LAB — RENAL FUNCTION PANEL
Albumin: 3 g/dL — ABNORMAL LOW (ref 3.5–5.0)
Anion gap: 12 mmol/L (ref 5–15)
BUN/Creatinine ratio: 4 — ABNORMAL LOW (ref 12–20)
BUN: 25 MG/DL — ABNORMAL HIGH (ref 6–20)
CO2: 29 MMOL/L (ref 21–32)
Calcium: 7.9 MG/DL — ABNORMAL LOW (ref 8.5–10.1)
Chloride: 91 MMOL/L — ABNORMAL LOW (ref 97–108)
Creatinine: 6.3 MG/DL — ABNORMAL HIGH (ref 0.6–1.3)
GFR est AA: 9 mL/min/{1.73_m2} — ABNORMAL LOW (ref >60)
GFR est non-AA: 8 mL/min/{1.73_m2} — ABNORMAL LOW (ref >60)
Glucose: 228 MG/DL — ABNORMAL HIGH (ref 65–100)
Phosphorus: 5 MG/DL — ABNORMAL HIGH (ref 2.5–4.9)
Potassium: 4 MMOL/L (ref 3.5–5.1)
Sodium: 132 MMOL/L — ABNORMAL LOW (ref 136–145)

## 2009-04-30 LAB — GLUCOSE, POC
Glucose (POC): 199 mg/dL — ABNORMAL HIGH (ref 65–105)
Glucose (POC): 222 mg/dL — ABNORMAL HIGH (ref 65–105)
Glucose (POC): 245 mg/dL — ABNORMAL HIGH (ref 65–105)

## 2009-04-30 LAB — MAGNESIUM: Magnesium: 1.6 MG/DL (ref 1.6–2.4)

## 2009-04-30 MED ORDER — SODIUM CHLORIDE 0.9 % IJ SYRG
INTRAMUSCULAR | Status: AC
Start: 2009-04-30 — End: 2009-04-30
  Administered 2009-04-30: 10:00:00

## 2009-04-30 MED ORDER — SODIUM CHLORIDE 0.9 % IJ SYRG
INTRAMUSCULAR | Status: AC
Start: 2009-04-30 — End: 2009-04-30
  Administered 2009-04-30: 20:00:00

## 2009-04-30 MED ORDER — HEPARIN, PORCINE (PF) 100 UNIT/ML IV SYRINGE
100 unit/mL | Freq: Once | INTRAVENOUS | Status: AC
Start: 2009-04-30 — End: 2009-04-30
  Administered 2009-04-30: 20:00:00 via INTRAVENOUS

## 2009-04-30 MED FILL — CALCIUM ACETATE 667 MG CAP: 667 mg | ORAL | Qty: 1

## 2009-04-30 MED FILL — NITROGLYCERIN 0.4 MG/HR TRANSDERM 24 HR PATCH: 0.4 mg/hr | TRANSDERMAL | Qty: 1

## 2009-04-30 MED FILL — HEPARIN (PORCINE) 5,000 UNIT/ML IJ SOLN: 5000 unit/mL | INTRAMUSCULAR | Qty: 1

## 2009-04-30 MED FILL — BD POSIFLUSH NORMAL SALINE 0.9 % INJECTION SYRINGE: INTRAMUSCULAR | Qty: 30

## 2009-04-30 MED FILL — OXYCODONE-ACETAMINOPHEN 5 MG-325 MG TAB: 5-325 mg | ORAL | Qty: 1

## 2009-04-30 MED FILL — INSULIN ASPART 100 UNIT/ML INJECTION: 100 unit/mL | SUBCUTANEOUS | Qty: 1

## 2009-04-30 MED FILL — DIPRIVAN 10 MG/ML INTRAVENOUS EMULSION: 10 mg/mL | INTRAVENOUS | Qty: 20

## 2009-04-30 MED FILL — MORPHINE 2 MG/ML INJECTION: 2 mg/mL | INTRAMUSCULAR | Qty: 1

## 2009-04-30 MED FILL — D5-1/2 NS & POTASSIUM CHLORIDE 20 MEQ/L IV: 20 mEq/L | INTRAVENOUS | Qty: 1000

## 2009-04-30 MED FILL — ONDANSETRON 4 MG TAB, RAPID DISSOLVE: 4 mg | ORAL | Qty: 1

## 2009-04-30 MED FILL — SEROQUEL 25 MG TABLET: 25 mg | ORAL | Qty: 2

## 2009-04-30 MED FILL — METOCLOPRAMIDE 5 MG/ML IJ SOLN: 5 mg/mL | INTRAMUSCULAR | Qty: 2

## 2009-04-30 MED FILL — CITALOPRAM 20 MG TAB: 20 mg | ORAL | Qty: 1

## 2009-04-30 MED FILL — FOSINOPRIL 10 MG TAB: 10 mg | ORAL | Qty: 2

## 2009-04-30 MED FILL — AMLODIPINE 5 MG TAB: 5 mg | ORAL | Qty: 1

## 2009-04-30 MED FILL — BD POSIFLUSH NORMAL SALINE 0.9 % INJECTION SYRINGE: INTRAMUSCULAR | Qty: 10

## 2009-04-30 MED FILL — VITAMIN D3 25 MCG (1,000 UNIT) TABLET: 25 mcg (1,000 unit) | ORAL | Qty: 1

## 2009-04-30 MED FILL — CARVEDILOL 12.5 MG TAB: 12.5 mg | ORAL | Qty: 2

## 2009-04-30 MED FILL — SENSORCAINE-MPF/EPINEPHRINE 0.5 %-1:200,000 INJECTION SOLUTION: 0.5 %-1:200,000 | INTRAMUSCULAR | Qty: 30

## 2009-04-30 MED FILL — CLONIDINE 0.2 MG TAB: 0.2 mg | ORAL | Qty: 1

## 2009-04-30 MED FILL — HEPARIN, PORCINE (PF) 100 UNIT/ML IV SYRINGE: 100 unit/mL | INTRAVENOUS | Qty: 5

## 2009-04-30 NOTE — Progress Notes (Signed)
Pt. Has no c/o today. Ready to go home  Afebrile VSS  Abd: Soft, Non tender.           Non Distended.           Incisions Clean  Today's labs - Noted.  Will d/c to home.  Rx/Instructins on Chart.  F/u 10-14 Days.

## 2009-04-30 NOTE — Progress Notes (Signed)
Port cath flushed per protocol, huber needle removed,, bandaid applied. D/c inst & script given good understanding. No complaints appreciative of care

## 2009-04-30 NOTE — Progress Notes (Signed)
Pt discharged. VS stable. Pt received and verbalized understanding of discharge instructions. Wheelchair took pt to discharge area, pushed by Nurse.

## 2009-05-02 NOTE — Discharge Summary (Signed)
Physician Discharge Summary     Patient ID:  Karen Fuller  [id number]  48 y.o.  [birth year]    Admit Date: 04/28/2009    Discharge Date: 04/30/2009    Admission Diagnoses: GASTROPARESIS  GASTROPARESIS  GASTROPARESIS    Discharge Diagnoses:  Patient Active Hospital Problem List:  Gastroparesis (04/29/2009)    Chronic kidney failure (04/29/2009)    Nausea & vomiting (04/29/2009)       Admission Condition: Stable    Discharge Condition: Stable    Last Procedure:  GASTRIC STIMULATOR INSERTION LAPAROSCOPIC - INSERTION LAPAROSCOPIC LEADS X 2, PLACEMENT OF ENTERRA GENERATOR,  INTRAOPERATIVE PROGRAMMING, EGD WITH BIOPSIES      Hospital Course:   Pt with gastroparesis, Laparoscopic Enterra Gastric Stimulator placement with EGD and gastric bypass. Post-op course uneventful.   Pt had HD at the bedside on POD 1.  Discharged to home on POD 2.    Consults: Nephrology    Significant Diagnostic Studies: none    Disposition: home    Patient Instructions:   Cannot display discharge medications since this patient is not currently admitted.    Activity: no driving while on analgesics and no heavy lifting for 4 weeks  Diet: Renal Diet  Wound Care: None needed    Follow-up with Dr. Geronimo Boot in 2 weeks.  Follow-up tests/labs none    Signed:  Alvina Chou, PA  05/02/2009  4:24 PM

## 2009-05-17 NOTE — Progress Notes (Signed)
Karen Fuller is a 48 y.o. female s/p Enterra on 10.11.10.  Current Settings are 7 volts, 14 hz, 330 pulsewidth, 0.1 On-time, 5 Off-time.  Current frequency of Nausea 2 episodes since surgery.  Current requency of vomiting 2 episodes since surgery.  She is remarkable improved with improved appetite.  90% improved by her account.  Current weight BP 118/54   Pulse 90   Temp 97.8 ??F (36.6 ??C)   Ht 5\' 4"  (1.626 m)   Wt 228 lb 8 oz (103.647 kg)   SpO2 97%.      BP 118/54   Pulse 90   Temp 97.8 ??F (36.6 ??C)   Ht 5\' 4"  (1.626 m)   Wt 228 lb 8 oz (103.647 kg)   SpO2 97%  Physical Examination: Abdomen - soft, nontender, nondistended, no masses or organomegaly  Incisions well healed.  generator mobile in pocket      Assessment:  S/P Enterra doing well.  Improvement in pre-operative symptoms.      Plan:    Follow-up in 6 weeks for evaluation

## 2009-05-17 NOTE — Progress Notes (Signed)
Follow up appt.-04-28-09 gastric stim placement

## 2009-08-13 NOTE — Progress Notes (Signed)
[facility]  08/11/2009 11:30 AM  Location: [facility]  Patient ID: [id number]  DOB: [birth year]  Married / Language: Undefined / Ethnicity: Undefined  Female      History of Present Illness??([name], M.D.; 08/13/2009 7:01 AM)  ????????????????Patient's words: 3 MONTH FOLLOW UP  The patient is a 49 year old female  Karen Fuller is seen in office today for follow up visit. She is doing fairly well from cardiac standpoint. She continues to have nausea and vomiting and previously has undergone gastric pacemaker placement. I have advised her to follow up with PCP and Dr. Patricia Pesa. Previously described dyspnea on heavy exertiona remains unchanges. As you know previously she had a normal stress test and normal echocardiogram. Her symptoms may be due to pulmonary process and she probably also has OSA. I have previously advised her to follow up with PCP as she may need pulmonary evaluation including sleep study. Unfortunately I can't order these test here since patient is from [place] and would prefer to hav tests in her home town. She denies any CP, palpitations, sweating, lightheadedness, dizziness, syncope or presyncope.  She is going for HD regularly. I will get results of recent FLP.    ??  Allergies??(Nigel Berthold, CMA; 08/11/2009 11:32 AM)  Allergy to DILAUDID INJECTION.?? STOPS HEART FROM BEATING.    ??  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  Living Situation.?? 2 KIDS, MARRIED    ??  Medication History??(Nigel Berthold, CMA; 08/11/2009 11:36 AM)  Transderm-Scop (1.5MG  Patch 72HR 1 Transdermal) Active - Hx Entry. (EVERY 3RD DAY AS NEEDED.)  Sucralfate (1GM Tablet 1 Oral three times daily, Stopped taking 08/11/2009) Discontinued - Hx Entry.  Lidocaine (5% Ointment 1 External as needed, Stopped taking 08/11/2009) Discontinued - Hx Entry.   Citalopram Hydrobromide (10MG  Tablet 1 Oral Daily, Stopped taking 08/11/2009) Discontinued - Hx Entry.  Calcium Acetate (667MG  Capsule 3 Oral three times daily, Stopped taking 08/11/2009) Discontinued - Hx Entry.  NexIUM (40MG  Capsule DR 1 Oral Daily) Active - Hx Entry.  Percocet (5-325MG  Tablet 1 Oral as needed, Stopped taking 08/11/2009) Discontinued - Hx Entry.  Rena-Vite (1 Oral Daily, Stopped taking 08/11/2009) Discontinued - Hx Entry.  SEROquel (50MG  Tablet 1 Oral at bedtime) Active - Hx Entry.  AmLODIPine Besylate (10MG  Tablet 1 Oral at bedtime) Active - Hx Entry.  Carvedilol (25MG  Tablet 1 Oral BID) Active - Hx Entry.  Lantus (100UNIT/ML Solution 25 UNITS Subcutaneous QHS) Active - Hx Entry.  NOVALOG (15 UNITS BEFORE EACH MEAL) Active - Hx Entry.    ??  Review of Systems??([name], M.D.; 08/13/2009 7:01 AM)  General:??Not Present- Anorexia, Chills, Dietary Changes, Fatigue, Fever, Medication Changes, Night Sweats, Weight Gain > 10lbs. and Weight Loss > 10lbs..  Skin:??Not Present- Bruising and Excessive Sweating.  HEENT:??Not Present- Headache, Visual Loss and Vertigo.  Respiratory:??Not Present- Cough, Decreased Exercise Tolerance, Snoring, Difficulty Breathing and Wheezing.  Cardiovascular:??Present- Difficulty Breathing On Exertion and Hypertension. Not Present- Chest Pain, Claudications, Fainting / Blacking Out, Edema, Irregular Heart Beat, Abnormal Blood Pressure, Elevated Blood Pressure, Night Cramps, Orthopnea, Palpitations, Rapid Heart Rate, Paroxysmal Nocturnal Dyspnea, Shortness of Breath and Swelling of Extremities.  Gastrointestinal:??Not Present- Black, Tarry Stool, Bloody Stool, Diarrhea, Hematemesis, Rectal Bleeding and Vomiting.  Musculoskeletal:??Not Present- Muscle Pain and Muscle Weakness.  Neurological:??Not Present- Dizziness.  Psychiatric:??Not Present- Depression.  Endocrine:??Not  Present- Cold Intolerance, Heat Intolerance and Thyroid Problems.   Hematology:??Not Present- Abnormal Bleeding, Anemia, Blood Clots and Easy Bruising.    ??  Vitals??(Nigel Berthold, CMA; 08/11/2009 11:36 AM)  08/11/2009 11:36 AM  ??Weight: 216.38 lb ??Height: 62 in  ??Body Surface Area: 2.07 m?? ??Body Mass Index: 39.58 kg/m??  ??BP: 100/40?????????? Manual (Sitting, Right Arm, Large)      ??  Physical Exam??(Cordell Coke Frederic Jericho, M.D.; 08/13/2009 7:02 AM)  The physical exam findings are as follows:  ??  General??  Mental Status??- Alert. General Appearance??- Not in acute distress.      Chest and Lung Exam??  Inspection:??Accessory muscles??- No use of accessory muscles in breathing.  Auscultation:??  Breath sounds:??- Normal.      Cardiovascular??  Inspection:??Jugular vein??- Bilateral??- Inspection Normal.  Palpation/Percussion:??  Apical Impulse:??- Normal.  Auscultation:??Rhythm??- Regular. Heart Sounds??- S1 WNL and S2 WNL. No S3 or S4.  Murmurs & Other Heart Sounds:??Auscultation of the heart reveals - No Murmurs.  Carotid arteries??- No Carotid bruit.      Peripheral Vascular??  Upper Extremity:??Inspection??- Bilateral??- No Cyanotic nailbeds or Digital clubbing.  Lower Extremity:??  Palpation:??Dorsalis pedis pulse??- Bilateral??- Normal. Posterior tibia pulse - Bilateral??- Normal. Edema??- Bilateral??- No edema.      Assessment & Plan??([name], M.D.; 08/13/2009 7:04 AM)  BENIGN ESSENTIAL HYPERTENSION - WELL CONTROLLED (401.1)  CHRONIC RENAL FAILURE (585.)  On HD.  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       Karen Fuller is seen in office today for follow up visit. She is doing fairly well from cardiac standpoint. She continues to have nausea and vomiting and previously has undergone gastric pacemaker placement. I have advised her to follow up with PCP and Dr. Patricia Pesa. Previously described dyspnea on heavy exertiona remains unchanges. As you know previously she had a normal stress test and normal echocardiogram. Her symptoms may be due to pulmonary process and she probably also has OSA. I have previously advised her to follow up with PCP as she may need pulmonary evaluation including sleep study. Unfortunately I can't order these test here since patient is from [place] and would prefer to hav tests in her home town.  She is going for HD regularly. I will get results of recent FLP.

## 2009-08-16 NOTE — Progress Notes (Signed)
Karen Fuller is a 49 y.o. female s/p Enterra on 10/10.  Current Settings are 7 volts, 14 hz, 330 pulsewidth, 0.1 On-time, 5 Off-time.  Current frequency of Nausea daily in the am.  Current requency of vomiting daily right now since jan.  One admission and one ed visit since jan.  BG well controlled.  Current weight BP 145/79   Pulse 72   Ht 5\' 4"  (1.626 m)   Wt 213 lb (96.616 kg)   SpO2 100%.      BP 145/79   Pulse 72   Ht 5\' 4"  (1.626 m)   Wt 213 lb (96.616 kg)   SpO2 100%  Physical Examination: Abdomen - soft, nontender, nondistended, no masses or organomegaly      Assessment:  S/P Enterra doing well.  Improvement in pre-operative symptoms but not as good as immediately post op     Plan:      Enterra Adjustment:    The device was interrogated the previous settings were confirmed.  The following settings were programmed in the office.  Voltage 8  PW 330  Hz 14  On-Time 0.1sec  Off-Time  5sec    The patient tolerated the procedure well. she did not develop abdominla pain or shocking sensation.    Follow-up in 6 weeks for evaluation

## 2009-09-05 NOTE — Progress Notes (Addendum)
Addended by: BRADBY, TAMMY L on: 09/05/2009      Modules accepted: Level of Service

## 2009-10-18 NOTE — Progress Notes (Signed)
Karen Fuller is a 49 y.o. female s/p Enterra on 10/10.  Current Settings are 8 volts, 14 hz, 330 pulsewidth, 0.1 On-time, 5 sec Off-time.  Current frequency of Nausea none.  Current requency of vomiting none.  Current weight BP 139/71   Pulse 95   SpO2 100%. She states that in March she had her porta cath removed secondary to infection. She got septic and had a lumbar tap. Since the lumbar tap she has not been able to walk and has been in a nursing home. She is very weak and is working with physical therapy.       BP 139/71   Pulse 95   SpO2 100%  Physical Examination: General appearance - alert, well appearing, and in no distress  Chest - clear to auscultation, no wheezes, rales or rhonchi, symmetric air entry  Heart - normal rate, regular rhythm, normal S1, S2, no murmurs, rubs, clicks or gallops  Abdomen - soft, nontender, nondistended, no masses or organomegaly  Extremities - peripheral pulses normal, no pedal edema, no clubbing or cyanosis      Assessment:  S/P Enterra doing well.  Improvement in pre-operative symptoms.      Plan:      Enterra Check:    The device was interrogated the previous settings were confirmed.  The following settings were programmed in the office.  Voltage 8.0  PW 330  Hz 14  On-Time 0.1sec  Off-Time  5sec  Battery>22mo  The patient tolerated the procedure well. she did not develop abdominla pain or shocking sensation.    Follow-up in 6 months for evaluation

## 2009-10-18 NOTE — Progress Notes (Signed)
I interviewed and examined the patient.  I have confirmed the history and exam findings as noted by the Nurse practioner.    I have edited the np note and performed the enterra check

## 2009-12-08 NOTE — Progress Notes (Signed)
[facility]  12/08/2009 10:44 AM  Location: [facility]  Patient #: [id number]  DOB: October 15, 1960  Married / Language: Undefined / Race: Undefined  Female      History of Present Illness??(Karen Fuller, M.D.; 12/08/2009 11:15 AM)  ??????????????????Patient words: 3 MONTH FOLLOW UP. ????DOING "PRETTY GOOD"    ??????????????????The patient is a 49 year old female    Note: Karen Fuller is seen in office today for follow up visit. She is doing fairly ??well from cardiac standpoint. She denies any chest pain, palpitations, sweating, lightheadedness, dizziness, syncope or presyncope. Previously described dyspnea on heavy exertiona remains unchanges. As you know previously she had a normal stress test and normal echocardiogram. Her symptoms are most likely due to pulmonary process and she probably also has OSA. I have previously advised her to follow up with PCP as she may need pulmonary evaluation including sleep study. Unfortunately I can't order these test here since patient is from [place] and would prefer to have tests in her home town.  She is going for HD regularly. I will check FLP.    ??  Allergies??(Karen Pedro, LPN; 0/45/4098 11:91 AM)  Ambien CR *HYPNOTICS*  Dilaudid *ANALGESICS - OPIOID*    ??  Medication History??(Karen Pedro, LPN; 4/78/2956 21:30 AM)  SEROquel (50MG  Tablet 1 Oral at bedtime) Active.  NexIUM (40MG  Capsule DR 1 Oral Daily) Active.  Transderm-Scop (1.5MG  Patch 72HR 1 Transdermal, Stopped taking 12/08/2009) Discontinued. (EVERY 3RD DAY AS NEEDED.)  Carvedilol (25MG  Tablet 1 Oral BID) Active.  Lantus (100UNIT/ML Solution 25 UNITS Subcutaneous QHS) Active.  AmLODIPine Besylate (10MG  Tablet 1 Oral at bedtime) Active.  NOVALOG (15 UNITS BEFORE EACH MEAL) Active.    ??  Review of Systems??(Karen Fuller, M.D.; 12/08/2009 11:15 AM)  General:??Not Present- Anorexia, Chills, Dietary Changes, Fatigue, Fever, Medication Changes, Night Sweats, Weight Gain > 10lbs. and Weight Loss > 10lbs..   Skin:??Not Present- Bruising and Excessive Sweating.  HEENT:??Not Present- Headache, Visual Loss and Vertigo.  Respiratory:??Not Present- Cough, Decreased Exercise Tolerance, Difficulty Breathing, Snoring and Wheezing.  Cardiovascular:??Present- Hypertension. Not Present- Abnormal Blood Pressure, Chest Pain, Claudications, Difficulty Breathing Lying Down, Difficulty Breathing On Exertion, Edema, Elevated Blood Pressure, Fainting / Blacking Out, Irregular Heart Beat, Night Cramps, Orthopnea, Palpitations, Paroxysmal Nocturnal Dyspnea, Rapid Heart Rate, Shortness of Breath, Slow Heart Rate and Swelling of Extremities.  Gastrointestinal:??Not Present- Black, Tarry Stool, Bloody Stool, Diarrhea, Hematemesis, Rectal Bleeding and Vomiting.  Musculoskeletal:??Not Present- Muscle Pain and Muscle Weakness.  Neurological:??Not Present- Dizziness.  Psychiatric:??Not Present- Depression.  Endocrine:??Not Present- Cold Intolerance, Heat Intolerance and Thyroid Problems.  Hematology:??Not Present- Abnormal Bleeding, Anemia, Blood Clots and Easy Bruising.    ??  Vitals??(Karen Pedro, LPN; 8/65/7846 96:29 AM)  12/08/2009 10:46 AM  ??Weight: 214.38 lb ??Height: 62 in  ??Body Surface Area: 2.06 m?? ??Body Mass Index: 39.21 kg/m??  ??Pulse: 80 (Regular)??  ??BP: 118/76?????? (Sitting, Left Arm, Standard)      ??  Physical Exam??(Karen Fuller, M.D.; 12/08/2009 11:15 AM)  The physical exam findings are as follows:    ??  General??  Mental Status??- Alert. General Appearance??- Not in acute distress.      Chest and Lung Exam??  Inspection:??Accessory muscles??- No use of accessory muscles in breathing.  Auscultation:??  Breath sounds:??- Normal.      Cardiovascular??  Inspection:??Jugular vein??- Bilateral??- Inspection Normal.  Palpation/Percussion:??  Apical Impulse:??- Normal.  Auscultation:??Rhythm??- Regular. Heart Sounds??- S1 WNL and S2 WNL. No S3 or S4.  Murmurs &  Other Heart Sounds:??Auscultation of the heart reveals - No Murmurs.      Peripheral Vascular??   Upper Extremity:??Inspection??- Bilateral??- No Cyanotic nailbeds or Digital clubbing.  Lower Extremity:??  Palpation:??Edema??- Bilateral??- No edema.      Assessment & Plan??(Karen Fuller, M.D.; 12/08/2009 11:16 AM)  BENIGN ESSENTIAL HYPERTENSION (401.1)  ??  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  ESRD ON HD  ??  CHRONIC RENAL FAILURE (585.)  Impression: On HD.  ??  GASTROPARESIS  Impression: S/P GASTRIC PACEMAKER  Current Plans  ????l????  Follow up in 6 months.    ????l????  LIPID PANEL-BILLABLE (80061) (fasting)

## 2010-03-28 NOTE — Progress Notes (Signed)
Karen Fuller is a 50 y.o. female s/p Enterra on 10/10.  Current Settings are 8 volts, 14 hz, 330 pulsewidth, 0.1 On-time, 5 Off-time.  Current frequency of Nausea 2xweek.  Current frequency of vomiting 1xmo.    Current weight BP 153/79   Pulse 95   Ht 5\' 4"  (1.626 m)   Wt 213 lb (96.616 kg)   BMI 36.56 kg/m2   SpO2 100%.      BP 153/79   Pulse 95   Ht 5\' 4"  (1.626 m)   Wt 213 lb (96.616 kg)   BMI 36.56 kg/m2   SpO2 100%  Physical Examination: Eyes - pupils equal and reactive, extraocular eye movements intact  Chest - clear to auscultation, no wheezes, rales or rhonchi, symmetric air entry  Heart - normal rate, regular rhythm, normal S1, S2, no murmurs, rubs, clicks or gallops  Abdomen - soft, nontender, nondistended, no masses or organomegaly  Extremities - peripheral pulses normal, no pedal edema, no clubbing or cyanosis      Assessment:  S/P Enterra doing well.  Improvement in pre-operative symptoms.      Plan:      Enterra Check:    The device was interrogated the previous settings were confirmed.  The following settings were programmed in the office.  Voltage 8  PW 330  Hz 14  On-Time 0.1sec  Off-Time  5sec    The patient tolerated the procedure well. she did not develop abdominal pain or shocking sensation.  6months

## 2010-06-20 NOTE — Patient Instructions (Signed)
MyChart Activation    Thank you for requesting access to MyChart. Please follow the instructions below to securely access and download your online medical record. MyChart allows you to send messages to your doctor, view your test results, renew your prescriptions, schedule appointments, and more.    How Do I Sign Up?    1. In your internet browser, go to [email or web address].  2. Click on the First Time User? Click Here link in the Sign In box. You will see the New Member Sign Up page.  3. Enter your MyChart Access Code exactly as it appears below. You will not need to use this code after you???ve completed the sign-up process. If you do not sign up before the expiration date, you must request a new code.    MyChart Access Code: [email or web address]  Expires: 09/18/10 10:13 AM (This is the date your MyChart access code will expire)    4. Enter the last four digits of your Social Security Number (xxxx) and Date of Birth (mm/dd/yyyy) as indicated and click Submit. You will be taken to the next sign-up page.  5. Create a MyChart ID. This will be your MyChart login ID and cannot be changed, so think of one that is secure and easy to remember.  6. Create a MyChart password. You can change your password at any time.  7. Enter your Password Reset Question and Answer. This can be used at a later time if you forget your password.   8. Enter your e-mail address. You will receive e-mail notification when new information is available in MyChart.  9. Click Sign Up. You can now view and download portions of your medical record.  10. Click the Download Summary menu link to download a portable copy of your medical information.    Additional Information    If you have questions, please call [phone]. Remember, MyChart is NOT to be used for urgent needs. For medical emergencies, dial 911.

## 2010-06-20 NOTE — Progress Notes (Signed)
Cardiology Progress Note      06/20/2010 11:32 AM      Subjective:     Karen Fuller is seen in office today for follow up visit. She is doing fairly ??well from cardiac standpoint. She denies any chest pain, palpitations, sweating, lightheadedness, dizziness, syncope or presyncope. Previously described dyspnea on heavy exertiona remains unchanges. As you know previously she had a normal stress test and normal echocardiogram. Her symptoms are most likely due to pulmonary process and she probably also has OSA. I have previously advised her to follow up with PCP as she may need pulmonary evaluation including sleep study. Unfortunately I can't order these test here since patient is from [place] and would prefer to have tests in her home town.  She is going for HD regularly. Recently had back surgery for back pain and now feeling better.     BP 132/80   Pulse 64   Resp 18   Ht 5\' 2"  (1.575 m)   Wt 234 lb 14.4 oz (106.55 kg)   BMI 42.96 kg/m2  Current outpatient prescriptions   Medication Sig   ??? omeprazole (PRILOSEC) 20 mg capsule Take 20 mg by mouth daily.   ??? insulin lispro (HUMALOG) 100 unit/mL injection 15 Units by SubCUTAneous route once. With Each Meal    ??? aspirin 81 mg chewable tablet Take 81 mg by mouth daily.   ??? calcium acetate (PHOSLO) 667 mg Cap Take  by mouth three (3) times daily (with meals).   ??? sucralfate (CARAFATE) 1 gram tablet Take 1 g by mouth four (4) times daily.   ??? pregabalin (LYRICA) 75 mg capsule Take  by mouth.   ??? meclizine (ANTIVERT) 12.5 mg tablet Take  by mouth three (3) times daily as needed.   ??? quetiapine (SEROQUEL) 50 mg tablet Take 50 mg by mouth two (2) times a day.   ??? amlodipine (NORVASC) 10 mg tablet Take  by mouth daily.   ??? carvedilol (COREG) 25 mg tablet Take 25 mg by mouth two (2) times daily (with meals).   ??? insulin glargine (LANTUS) 100 unit/mL injection 25 Units by SubCUTAneous route. At Bedtime             Objective:       Visit Vitals   Item Reading   ??? BP 132/80   ??? Pulse 64   ??? Resp 18   ??? Ht 5\' 2"  (1.575 m)   ??? Wt 234 lb 14.4 oz (106.55 kg)   ??? BMI 42.96 kg/m2         Data Review:   Labs:  No results found for this or any previous visit (from the past 24 hour(s)).    Reviewed and/or ordered active problem list, medication list, notes from last encounter tests      Review of Systems??    General:??Not Present- Anorexia, Chills, Dietary Changes, Fatigue, Fever, Medication Changes, Night Sweats, Weight Gain > 10lbs. and Weight Loss > 10lbs..  Skin:??Not Present- Bruising and Excessive Sweating.  HEENT:??Not Present- Headache, Visual Loss and Vertigo.  Respiratory:??Not Present- Cough, Decreased Exercise Tolerance, Difficulty Breathing, Snoring and Wheezing.  Cardiovascular:??Not Present- Abnormal Blood Pressure, Chest Pain, Claudications, Difficulty Breathing On Exertion, Edema, Fainting / Blacking Out, Irregular Heart Beat, Night Cramps, Orthopnea, Palpitations, Paroxysmal Nocturnal Dyspnea, Rapid Heart Rate, Shortness of Breath and Swelling of Extremities.  Gastrointestinal:??Not Present- Black, Tarry Stool, Bloody Stool, Diarrhea, Hematemesis, Rectal Bleeding and Vomiting.  Musculoskeletal:??Not Present-  Muscle Pain and Muscle Weakness.  Neurological:??Not Present- Dizziness.  Psychiatric:??Not Present- Depression.  Endocrine:??Not Present- Cold Intolerance, Heat Intolerance and Thyroid Problems.  Hematology:??Not Present- Abnormal Bleeding, Anemia, Blood Clots and Easy Bruising.    ??  Physical Exam??  The physical exam findings are as follows:    ??  General??  Mental Status??- Alert. General Appearance??- Not in acute distress.      Chest and Lung Exam??  Inspection:??Accessory muscles??- No use of accessory muscles in breathing.  Auscultation:??  Breath sounds:??- Normal.      Cardiovascular??  Inspection:??Jugular vein??- Bilateral??- Inspection Normal.  Palpation/Percussion:??  Apical Impulse:??- Normal.   Auscultation:??Rhythm??- Regular. Heart Sounds??- S1 WNL and S2 WNL. No S3 or S4.  Murmurs & Other Heart Sounds:??Auscultation of the heart reveals - No Murmurs.  Carotid arteries??- No Carotid bruit.      Peripheral Vascular??  Upper Extremity:??Inspection??- Bilateral??- No Cyanotic nailbeds or Digital clubbing.  Lower Extremity:??  Palpation:??Dorsalis pedis pulse??- Bilateral??- Normal. Posterior tibia pulse - Bilateral??- Normal. Edema??- Bilateral??- No edema.      Assessment:     Patient Active Problem List   Diagnoses Date Noted   ??? Gastroparesis [536.3] 04/29/2009   ??? Chronic kidney failure [585.9D] 04/29/2009   ??? Nausea & vomiting [787.01H] 04/29/2009   ??? Reflux [530.81AM] 01/12/2009   ??? DM (diabetes mellitus) [250.00BV] 01/12/2009   ??? HTN (hypertension) [401.9AF] 01/12/2009   ??? Chronic kidney failure [585.9D] 01/12/2009   ??? Abdominal pain [789.00AP] 01/12/2009   ??? Nausea & vomiting [787.01H] 01/12/2009   ??? Gastroparesis [536.3] 01/12/2009         Plan:     BENIGN ESSENTIAL HYPERTENSION (401.1)  ??  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  ESRD ON HD  ??  CHRONIC RENAL FAILURE (585.)  Impression: On HD.  ??  GASTROPARESIS  Impression: S/P GASTRIC PACEMAKER    She never did blood work and sleep study since last visit. Will reschedule. Continue current medications.

## 2010-06-28 LAB — LIPID PANEL
Cholesterol, total: 192 mg/dL (ref 100–199)
HDL Cholesterol: 34 mg/dL — ABNORMAL LOW (ref >39)
LDL, calculated: 116 mg/dL — ABNORMAL HIGH (ref 0–99)
Triglyceride: 209 mg/dL — ABNORMAL HIGH (ref 0–149)
VLDL, calculated: 42 mg/dL — ABNORMAL HIGH (ref 5–40)

## 2010-06-28 LAB — METABOLIC PANEL, COMPREHENSIVE
A-G Ratio: 1.1 (ref 1.1–2.5)
ALT (SGPT): 7 IU/L (ref 0–40)
AST (SGOT): 13 IU/L (ref 0–40)
Albumin: 4.2 g/dL (ref 3.5–5.5)
Alk. phosphatase: 174 IU/L — ABNORMAL HIGH (ref 25–150)
BUN/Creatinine ratio: 4 — ABNORMAL LOW (ref 9–23)
BUN: 25 mg/dL — ABNORMAL HIGH (ref 6–24)
Bilirubin, total: 0.4 mg/dL (ref 0.0–1.2)
CO2: 26 mmol/L (ref 20–32)
Calcium: 9.2 mg/dL (ref 8.7–10.2)
Chloride: 93 mmol/L — ABNORMAL LOW (ref 97–108)
Creatinine: 5.81 mg/dL — ABNORMAL HIGH (ref 0.57–1.00)
GFR est AA: 9 mL/min/{1.73_m2} — ABNORMAL LOW (ref >59)
GFR est non-AA: 8 mL/min/{1.73_m2} — ABNORMAL LOW (ref >59)
GLOBULIN, TOTAL: 3.7 g/dL (ref 1.5–4.5)
Glucose: 252 mg/dL — ABNORMAL HIGH (ref 65–99)
Potassium: 4.2 mmol/L (ref 3.5–5.2)
Protein, total: 7.9 g/dL (ref 6.0–8.5)
Sodium: 139 mmol/L (ref 134–144)

## 2010-06-28 LAB — PLEASE NOTE

## 2010-07-03 MED ORDER — ATORVASTATIN 10 MG TAB
10 mg | ORAL_TABLET | Freq: Every day | ORAL | Status: DC
Start: 2010-07-03 — End: 2010-08-28

## 2010-08-25 NOTE — Telephone Encounter (Signed)
We received a fax from [facility] that patients insurance will not cover the Lipitor or the generic can we switch her to something else. Please advise.

## 2010-08-28 NOTE — Telephone Encounter (Signed)
Per Dr. Delbert Phenix change patient to from Lipitor to Rx Pravastatin 20mg  # 30 1 daily with 11 refills called to [facility] @ [phone].

## 2010-12-12 MED ORDER — PROCHLORPERAZINE 25 MG RECTAL SUPPOSITORY
25 mg | Freq: Two times a day (BID) | RECTAL | Status: AC | PRN
Start: 2010-12-12 — End: 2010-12-19

## 2010-12-12 NOTE — Progress Notes (Signed)
Karen Fuller is a 50 y.o. female s/p Enterra on 10/10. had been doing well with minimal nausea and vomiting. Hospitalized for n/v 3 times in the last month.  Placed reglan during last visit.  Had xray at [facility].  No egd.  Current Settings are 8.5 volts, 14 hz, 330 pulsewidth, 0.1 On-time, 5 Off-time.  Up until this last month frequency of Nausea 1xmonth.  Current frequency of vomiting 1xmo.    On abx for chronic back infection ove rthe last 2-3 months.  Min narcotic usage.  BG well controlled.      Current weight BP 126/74   Pulse 82   Temp 97.6 ??F (36.4 ??C)   Ht 5\' 2"  (1.575 m)   Wt 224 lb (101.606 kg)   BMI 40.97 kg/m2   SpO2 98%.      BP 126/74   Pulse 82   Temp 97.6 ??F (36.4 ??C)   Ht 5\' 2"  (1.575 m)   Wt 224 lb (101.606 kg)   BMI 40.97 kg/m2   SpO2 98%  Physical Examination: Eyes - pupils equal and reactive, extraocular eye movements intact  Chest - clear to auscultation, no wheezes, rales or rhonchi, symmetric air entry  Heart - normal rate, regular rhythm, normal S1, S2, no murmurs, rubs, clicks or gallops  Abdomen - soft, nontender, nondistended, no masses or organomegaly  Extremities - peripheral pulses normal, no pedal edema, no clubbing or cyanosis      Assessment:  S/P Enterra doing well.  Improvement in pre-operative symptoms.  Recent worsening that is unexplained.  Will adjust up the generator temporarily  Add compazibe suppository for breakthrough symptoms    Plan:      Enterra Check:    The device was interrogated the previous settings were confirmed.  The following settings were programmed in the office.  Voltage 9  PW 330  Hz 14  On-Time 0.1sec  Off-Time  5sec    The patient tolerated the procedure well. she did not develop abdominal pain or shocking sensation.  3months follow up

## 2010-12-12 NOTE — Patient Instructions (Signed)
An After Visit Summary was printed and given to the patient.

## 2010-12-12 NOTE — Progress Notes (Signed)
Patient is here for check of gastric stimulator.

## 2011-03-13 NOTE — Patient Instructions (Signed)
An After Visit Summary was printed and given to the patient.

## 2011-03-13 NOTE — Progress Notes (Signed)
Karen Fuller is a 50 y.o. female s/p Enterra on 10/10. adjusted up 0.5 volts to 9.0 for increased symptoms at the last visit.  She has had no nausea or vomiting since that appt.  No hospitalizations  Current Settings are 9.0 volts, 14 hz, 330 pulsewidth, 0.1 On-time, 5 Off-time. .    No narcotic usage.  BG well controlled.      Current weight BP 133/73   Pulse 90   Temp 97.5 ??F (36.4 ??C)   Ht 5\' 2"  (1.575 m)   Wt 229 lb (103.874 kg)   BMI 41.88 kg/m2   SpO2 100%.      BP 133/73   Pulse 90   Temp 97.5 ??F (36.4 ??C)   Ht 5\' 2"  (1.575 m)   Wt 229 lb (103.874 kg)   BMI 41.88 kg/m2   SpO2 100%  Physical Examination: Eyes - pupils equal and reactive, extraocular eye movements intact  Chest - clear to auscultation, no wheezes, rales or rhonchi, symmetric air entry  Heart - normal rate, regular rhythm, normal S1, S2, no murmurs, rubs, clicks or gallops  Abdomen - soft, nontender, nondistended, no masses or organomegaly  Extremities - peripheral pulses normal, no pedal edema, no clubbing or cyanosis      Assessment:  S/P Enterra doing well.  Improvement in pre-operative symptoms.  Asymptomatic at this time    Plan:      Enterra Check:    The device was interrogated the previous settings were confirmed.  The following settings were programmed in the office.  Voltage 9  PW 330  Hz 14  On-Time 0.1sec  Off-Time  5sec    The patient tolerated the procedure well. she did not develop abdominal pain or shocking sensation.  3months follow up

## 2011-03-13 NOTE — Progress Notes (Signed)
Addended by: Raylene Miyamoto on: 03/13/2011 01:59 PM     Modules accepted: Level of Service

## 2011-03-13 NOTE — Progress Notes (Signed)
Patient is here for gastric stimulator check, states she is not having any problems-eating peanuts in waiting area.

## 2011-04-05 ENCOUNTER — Encounter: Payer: Self-pay | Admitting: *Deleted

## 2011-04-05 ENCOUNTER — Emergency Department (HOSPITAL_COMMUNITY)
Admission: EM | Admit: 2011-04-05 | Discharge: 2011-04-06 | Disposition: A | Discharge status: Home / Self Care | Payer: Medicare Other | Attending: Emergency Medicine | Admitting: Emergency Medicine

## 2011-04-05 ENCOUNTER — Emergency Department (HOSPITAL_COMMUNITY): Payer: Medicare Other

## 2011-04-05 DIAGNOSIS — Z992 Dependence on renal dialysis: Secondary | ICD-10-CM | POA: Insufficient documentation

## 2011-04-05 DIAGNOSIS — Z79899 Other long term (current) drug therapy: Secondary | ICD-10-CM | POA: Insufficient documentation

## 2011-04-05 DIAGNOSIS — I10 Essential (primary) hypertension: Secondary | ICD-10-CM | POA: Insufficient documentation

## 2011-04-05 DIAGNOSIS — IMO0002 Reserved for concepts with insufficient information to code with codable children: Secondary | ICD-10-CM | POA: Insufficient documentation

## 2011-04-05 DIAGNOSIS — Z794 Long term (current) use of insulin: Secondary | ICD-10-CM | POA: Insufficient documentation

## 2011-04-05 DIAGNOSIS — W050XXA Fall from non-moving wheelchair, initial encounter: Secondary | ICD-10-CM | POA: Insufficient documentation

## 2011-04-05 DIAGNOSIS — N289 Disorder of kidney and ureter, unspecified: Secondary | ICD-10-CM | POA: Insufficient documentation

## 2011-04-05 DIAGNOSIS — T148XXA Other injury of unspecified body region, initial encounter: Secondary | ICD-10-CM

## 2011-04-05 DIAGNOSIS — S8000XA Contusion of unspecified knee, initial encounter: Secondary | ICD-10-CM | POA: Insufficient documentation

## 2011-04-05 DIAGNOSIS — E119 Type 2 diabetes mellitus without complications: Secondary | ICD-10-CM | POA: Insufficient documentation

## 2011-04-05 DIAGNOSIS — M25569 Pain in unspecified knee: Secondary | ICD-10-CM | POA: Insufficient documentation

## 2011-04-05 HISTORY — DX: Essential (primary) hypertension: I10

## 2011-04-05 HISTORY — DX: Osteomyelitis, unspecified: M86.9

## 2011-04-05 NOTE — ED Notes (Signed)
C/o right knee pain s/p fall 2 days ago

## 2011-04-05 NOTE — ED Provider Notes (Signed)
History     CSN: [id number] Arrival date & time: 04/05/2011 10:31 PM   First MD Initiated Contact with Patient 04/05/11 2312      Chief Complaint  Patient presents with  . Fall  . Knee Pain    (Consider location/radiation/quality/duration/timing/severity/associated sxs/prior treatment) HPI Comments: Seen 2311  Patient is a 50 y.o. female presenting with fall and knee pain. The history is provided by the patient.  Fall Incident onset: fell out of wheelchair two days ago hitting right knee on floor. Has an abrasion to the knee and pain to the knee with bending. Incident: fall occured while transferring. She fell from a height of 1 to 2 ft. She landed on a hard floor. The point of impact was the right knee. The pain is present in the right knee. The pain is at a severity of 5/10. The pain is moderate. She was not ambulatory at the scene (patient is not ambulatory). There was no entrapment after the fall. The symptoms are aggravated by activity. She has tried ice for the symptoms. The treatment provided no relief.  Knee Pain    Past Medical History  Diagnosis Date  . Diabetes mellitus   . Osteomyelitis   . Hypertension   . Renal disorder     hemodialysis    Past Surgical History  Procedure Date  . Back surgery   . Cholecystectomy   . Av fistula placement     left arm    No family history on file.  History  Substance Use Topics  . Smoking status: Never Smoker   . Smokeless tobacco: Not on file  . Alcohol Use: No    OB History    Grav Para Term Preterm Abortions TAB SAB Ect Mult Living                  Review of Systems  Musculoskeletal:       Knee pain  Skin:       abrasion    Allergies  Dilaudid; Lortab; Tylenol; Ambien; Bee venom; and Xanax  Home Medications   Current Outpatient Rx  Name Route Sig Dispense Refill  . AMLODIPINE BESYLATE 10 MG PO TABS Oral Take 10 mg by mouth at bedtime.      . ASPIRIN EC 81 MG PO TBEC Oral Take 81 mg by mouth every  morning.      Marland Kitchen CALCIUM ACETATE 667 MG PO CAPS Oral Take 667 mg by mouth 3 (three) times daily.      Marland Kitchen CARVEDILOL 25 MG PO TABS Oral Take 25 mg by mouth 2 (two) times daily with a meal.      . CINACALCET HCL 30 MG PO TABS Oral Take 30 mg by mouth every evening.      Marland Kitchen HYDRALAZINE HCL 50 MG PO TABS Oral Take 50 mg by mouth 4 (four) times daily.      . INSULIN GLARGINE 100 UNIT/ML [place] SOLN Subcutaneous Inject 25 Units into the skin at bedtime.      . INSULIN LISPRO (HUMAN) 100 UNIT/ML [place] SOLN Subcutaneous Inject 15 Units into the skin 3 (three) times daily before meals.      Marland Kitchen LEVOFLOXACIN 750 MG PO TABS Oral Take 750 mg by mouth every morning.      Marland Kitchen LISINOPRIL 20 MG PO TABS Oral Take 20 mg by mouth 2 (two) times daily.      Marland Kitchen MEDROXYPROGESTERONE ACETATE 10 MG PO TABS Oral Take 10 mg by mouth as directed. Every  other month     . OMEPRAZOLE 20 MG PO CPDR Oral Take 20 mg by mouth every morning.      Marland Kitchen PRAVASTATIN SODIUM 20 MG PO TABS Oral Take 20 mg by mouth every morning.      Marland Kitchen PREGABALIN 75 MG PO CAPS Oral Take 75 mg by mouth 2 (two) times daily.      . QUETIAPINE FUMARATE 150 MG PO TB24 Oral Take 150 mg by mouth at bedtime.      Marland Kitchen SEVELAMER CARBONATE 800 MG PO TABS Oral Take 800 mg by mouth 3 (three) times daily with meals.      . SULFAMETHOXAZOLE-TMP DS 800-160 MG PO TABS Oral Take 1 tablet by mouth daily.      . TRAMADOL HCL 50 MG PO TABS Oral Take 50 mg by mouth daily as needed. For back  pain       BP 154/78  Pulse 91  Temp(Src) 98.5 F (36.9 C) (Oral)  Resp 16  Ht 5\' 2"  (1.575 m)  Wt 230 lb (104.327 kg)  BMI 42.07 kg/m2  SpO2 99%  Physical Exam  Nursing note and vitals reviewed. Constitutional: She appears well-developed and well-nourished.  HENT:  Head: Normocephalic and atraumatic.  Eyes: EOM are normal.  Neck: Normal range of motion. Neck supple.  Cardiovascular: Normal rate, normal heart sounds and intact distal pulses.   Pulmonary/Chest: Effort normal and breath sounds  normal.  Abdominal: Soft.  Musculoskeletal:       Right knee with abrasion, 3 cm x 2 cm. No effusion, mild crepitus with movement, no deformity.   Dg Knee Complete 4 Views Right  04/06/2011  *RADIOLOGY REPORT*  Clinical Data: Pain after fall  RIGHT KNEE - COMPLETE 4+ VIEW  Comparison: None.  Findings: The right knee appears intact. No evidence of acute fracture or subluxation.  No focal bone lesions.  Bone matrix and cortex appear intact.  No abnormal radiopaque densities in the soft tissues.  Suggestion of small effusion.  Vascular calcifications.  IMPRESSION: No acute bony abnormalities demonstrated.  Probable small effusion.  Original Report Authenticated By: Neale Burly, M.D.    ED Course  Procedures (including critical care time)    MDM  Patient fell out of wheelchair onto right knee. Sustained an abrasion and bruising. Xray with no evidence of fracture. Wound dressed by nursing. Pt stable in ED with no significant deterioration in condition.The patient appears reasonably screened and/or stabilized for discharge and I doubt any other medical condition or other [facility] requiring further screening, evaluation, or treatment in the ED at this time prior to discharge. MDM Reviewed: nursing note and vitals Interpretation: x-ray           Gypsy Balsam. [name], MD 04/06/11 [id number]

## 2011-04-06 NOTE — ED Notes (Signed)
Rt knee cleansed with NS, Telfa applied,covered with sterile gauze, and bandaged.

## 2011-10-06 ENCOUNTER — Emergency Department (HOSPITAL_COMMUNITY)
Admission: EM | Admit: 2011-10-06 | Discharge: 2011-10-06 | Disposition: A | Discharge status: Home / Self Care | Payer: Medicare Other | Attending: Emergency Medicine | Admitting: Emergency Medicine

## 2011-10-06 ENCOUNTER — Emergency Department (HOSPITAL_COMMUNITY): Payer: Medicare Other

## 2011-10-06 ENCOUNTER — Encounter (HOSPITAL_COMMUNITY): Payer: Self-pay | Admitting: *Deleted

## 2011-10-06 DIAGNOSIS — R10816 Epigastric abdominal tenderness: Secondary | ICD-10-CM | POA: Insufficient documentation

## 2011-10-06 DIAGNOSIS — I120 Hypertensive chronic kidney disease with stage 5 chronic kidney disease or end stage renal disease: Secondary | ICD-10-CM | POA: Insufficient documentation

## 2011-10-06 DIAGNOSIS — R630 Anorexia: Secondary | ICD-10-CM | POA: Insufficient documentation

## 2011-10-06 DIAGNOSIS — Z79899 Other long term (current) drug therapy: Secondary | ICD-10-CM | POA: Insufficient documentation

## 2011-10-06 DIAGNOSIS — E669 Obesity, unspecified: Secondary | ICD-10-CM | POA: Insufficient documentation

## 2011-10-06 DIAGNOSIS — R112 Nausea with vomiting, unspecified: Secondary | ICD-10-CM | POA: Insufficient documentation

## 2011-10-06 DIAGNOSIS — E119 Type 2 diabetes mellitus without complications: Secondary | ICD-10-CM | POA: Insufficient documentation

## 2011-10-06 DIAGNOSIS — R109 Unspecified abdominal pain: Secondary | ICD-10-CM | POA: Insufficient documentation

## 2011-10-06 DIAGNOSIS — N186 End stage renal disease: Secondary | ICD-10-CM | POA: Insufficient documentation

## 2011-10-06 DIAGNOSIS — Z7982 Long term (current) use of aspirin: Secondary | ICD-10-CM | POA: Insufficient documentation

## 2011-10-06 DIAGNOSIS — Z992 Dependence on renal dialysis: Secondary | ICD-10-CM | POA: Insufficient documentation

## 2011-10-06 DIAGNOSIS — K3184 Gastroparesis: Secondary | ICD-10-CM | POA: Insufficient documentation

## 2011-10-06 LAB — CBC
HCT: 42.1 % (ref 36.0–46.0)
Hemoglobin: 13.8 g/dL (ref 12.0–15.0)
MCH: 30.5 pg (ref 26.0–34.0)
MCV: 92.9 fL (ref 78.0–100.0)
Platelets: 346 10*3/uL (ref 150–400)
RBC: 4.53 MIL/uL (ref 3.87–5.11)
WBC: 12.6 10*3/uL — ABNORMAL HIGH (ref 4.0–10.5)

## 2011-10-06 LAB — DIFFERENTIAL
Basophils Absolute: 0 10*3/uL (ref 0.0–0.1)
Basophils Relative: 0 % (ref 0–1)
Eosinophils Absolute: 0.1 10*3/uL (ref 0.0–0.7)
Eosinophils Relative: 1 % (ref 0–5)
Metamyelocytes Relative: 0 %
Monocytes Absolute: 0.4 10*3/uL (ref 0.1–1.0)
Monocytes Relative: 3 % (ref 3–12)
nRBC: 0 /100 WBC

## 2011-10-06 LAB — LIPASE, BLOOD: Lipase: 21 U/L (ref 11–59)

## 2011-10-06 LAB — COMPREHENSIVE METABOLIC PANEL
Albumin: 3.9 g/dL (ref 3.5–5.2)
BUN: 24 mg/dL — ABNORMAL HIGH (ref 6–23)
Calcium: 10.4 mg/dL (ref 8.4–10.5)
GFR calc Af Amer: 7 mL/min — ABNORMAL LOW (ref >90)
Glucose, Bld: 331 mg/dL — ABNORMAL HIGH (ref 70–99)
Sodium: 136 mEq/L (ref 135–145)
Total Protein: 8.3 g/dL (ref 6.0–8.3)

## 2011-10-06 MED ORDER — MORPHINE SULFATE 4 MG/ML IJ SOLN
INTRAMUSCULAR | Status: AC
  Filled 2011-10-06: qty 1

## 2011-10-06 MED ORDER — OXYCODONE HCL 5 MG PO CAPS: 5.0000 mg | ORAL_CAPSULE | ORAL | Status: AC | PRN

## 2011-10-06 MED ORDER — MORPHINE SULFATE 4 MG/ML IJ SOLN
4.0000 mg | Freq: Once | INTRAMUSCULAR | Status: AC
  Administered 2011-10-06: 4 mg via INTRAVENOUS
  Filled 2011-10-06: qty 1

## 2011-10-06 MED ORDER — ONDANSETRON 8 MG PO TBDP
ORAL_TABLET | ORAL | Status: AC
  Filled 2011-10-06: qty 1

## 2011-10-06 MED ORDER — ONDANSETRON HCL 4 MG/2ML IJ SOLN
4.0000 mg | Freq: Once | INTRAMUSCULAR | Status: AC
  Administered 2011-10-06: 4 mg via INTRAVENOUS
  Filled 2011-10-06: qty 2

## 2011-10-06 MED ORDER — HYDROMORPHONE HCL PF 1 MG/ML IJ SOLN
INTRAMUSCULAR | Status: AC
  Filled 2011-10-06: qty 1

## 2011-10-06 MED ORDER — ONDANSETRON 8 MG PO TBDP
ORAL_TABLET | ORAL | Status: AC
  Administered 2011-10-06: 21:00:00
  Filled 2011-10-06: qty 1

## 2011-10-06 MED ORDER — PROMETHAZINE HCL 25 MG RE SUPP: 25.0000 mg | Freq: Four times a day (QID) | RECTAL | Status: DC | PRN

## 2011-10-06 MED ORDER — MORPHINE SULFATE 4 MG/ML IJ SOLN
4.0000 mg | Freq: Once | INTRAMUSCULAR | Status: AC
  Administered 2011-10-06: 4 mg via INTRAVENOUS

## 2011-10-06 MED ORDER — SODIUM CHLORIDE 0.9 % IV BOLUS (SEPSIS): 1000.0000 mL | Freq: Once | INTRAVENOUS | Status: DC

## 2011-10-06 NOTE — Discharge Instructions (Signed)
Follow up with your md next week. °

## 2011-10-06 NOTE — ED Provider Notes (Addendum)
History     CSN: [id number]  Arrival date & time 10/06/11  2002   First MD Initiated Contact with Patient 10/06/11 2021      Chief Complaint  Patient presents with  . Nausea  . Abdominal Pain    (Consider location/radiation/quality/duration/timing/severity/associated sxs/prior treatment) HPI.....abdominal pain, nausea, poor appetite, vomiting for 24 hours. Patient has end-stage renal disease with dialysis on Monday Wednesday and Friday. This happens frequently secondary to gastroparesis. No fever, chills, diarrhea.  Past Medical History  Diagnosis Date  . Diabetes mellitus   . Osteomyelitis   . Hypertension   . Renal disorder     hemodialysis    Past Surgical History  Procedure Date  . Back surgery   . Cholecystectomy   . Av fistula placement     left arm  . Pacemaker insertion     No family history on file.  History  Substance Use Topics  . Smoking status: Never Smoker   . Smokeless tobacco: Not on file  . Alcohol Use: No    OB History    Grav Para Term Preterm Abortions TAB SAB Ect Mult Living                  Review of Systems  All other systems reviewed and are negative.    Allergies  Dilaudid; Lortab; Tylenol; Ace inhibitors; Alprazolam er; Ambien; and Bee venom  Home Medications   Current Outpatient Rx  Name Route Sig Dispense Refill  . AMLODIPINE BESYLATE 10 MG PO TABS Oral Take 10 mg by mouth at bedtime.      . ASPIRIN EC 81 MG PO TBEC Oral Take 81 mg by mouth every morning.      Marland Kitchen CALCIUM ACETATE 667 MG PO CAPS Oral Take 667 mg by mouth 3 (three) times daily.      Marland Kitchen CARVEDILOL 25 MG PO TABS Oral Take 25 mg by mouth 2 (two) times daily with a meal.      . CINACALCET HCL 30 MG PO TABS Oral Take 30 mg by mouth every evening.      Marland Kitchen HYDRALAZINE HCL 50 MG PO TABS Oral Take 50 mg by mouth 4 (four) times daily.      . INSULIN GLARGINE 100 UNIT/ML  SOLN Subcutaneous Inject 25 Units into the skin at bedtime.      . INSULIN LISPRO (HUMAN) 100  UNIT/ML  SOLN Subcutaneous Inject 15 Units into the skin 3 (three) times daily before meals.      Marland Kitchen LEVOFLOXACIN 750 MG PO TABS Oral Take 750 mg by mouth every morning.      Marland Kitchen LISINOPRIL 20 MG PO TABS Oral Take 20 mg by mouth 2 (two) times daily.      Marland Kitchen MEDROXYPROGESTERONE ACETATE 10 MG PO TABS Oral Take 10 mg by mouth as directed. Every other month     . OMEPRAZOLE 20 MG PO CPDR Oral Take 20 mg by mouth every morning.      Marland Kitchen PRAVASTATIN SODIUM 20 MG PO TABS Oral Take 20 mg by mouth every morning.      Marland Kitchen PREGABALIN 75 MG PO CAPS Oral Take 75 mg by mouth 2 (two) times daily.      . QUETIAPINE FUMARATE ER 150 MG PO TB24 Oral Take 150 mg by mouth at bedtime.      Marland Kitchen SEVELAMER CARBONATE 800 MG PO TABS Oral Take 800 mg by mouth 3 (three) times daily with meals.      Gardiner Ramus DS  800-160 MG PO TABS Oral Take 1 tablet by mouth daily.      . TRAMADOL HCL 50 MG PO TABS Oral Take 50 mg by mouth daily as needed. For back  pain       BP 159/83  Pulse 93  Temp(Src) 97.7 F (36.5 C) (Oral)  Resp 20  Ht 5\' 2"  (1.575 m)  Wt 220 lb (99.791 kg)  BMI 40.24 kg/m2  SpO2 100%  Physical Exam  Nursing note and vitals reviewed. Constitutional: She is oriented to person, place, and time. She appears well-developed and well-nourished.       obese  HENT:  Head: Normocephalic and atraumatic.  Eyes: Conjunctivae and EOM are normal. Pupils are equal, round, and reactive to light.  Neck: Normal range of motion. Neck supple.  Cardiovascular: Normal rate and regular rhythm.   Pulmonary/Chest: Effort normal and breath sounds normal.  Abdominal: Soft. Bowel sounds are normal.       Minimal epigastric tenderness.  Musculoskeletal: Normal range of motion.  Neurological: She is alert and oriented to person, place, and time.  Skin: Skin is warm and dry.  Psychiatric: She has a normal mood and affect.    ED Course  Procedures (including critical care time)  Labs Reviewed  CBC - Abnormal; Notable  for the following:    WBC 12.6 (*)    RDW 16.8 (*)    All other components within normal limits  DIFFERENTIAL - Abnormal; Notable for the following:    Neutrophils Relative 79 (*)    Neutro Abs 10.0 (*)    All other components within normal limits  COMPREHENSIVE METABOLIC PANEL - Abnormal; Notable for the following:    Chloride 93 (*)    Glucose, Bld 331 (*)    BUN 24 (*)    Creatinine, Ser 7.48 (*)    GFR calc non Af Amer 6 (*)    GFR calc Af Amer 7 (*)    All other components within normal limits  LIPASE, BLOOD  URINALYSIS, ROUTINE W REFLEX MICROSCOPIC   No results found.   No diagnosis found.    MDM  Will check labs, treat pain and nausea  Recheck at 2200:   Decrease pain. Decrease nausea. Will get acute abdominal series to rule out free air. Discussed with Dr. Shirlee Limerick, MD 10/06/11 [id number]  Nat Christen, MD 10/06/11 2157

## 2011-10-06 NOTE — ED Notes (Signed)
Pt presents with abdominal pain, nausea, decreased appetite and emesis of bile x 1 day. Pt denies diarrhea and fever.

## 2011-10-06 NOTE — ED Provider Notes (Signed)
Pt improved with tx.  No more nauseau  Maudry Diego, MD 10/06/11 2308

## 2011-11-04 ENCOUNTER — Observation Stay (HOSPITAL_COMMUNITY)
Admission: EM | Admit: 2011-11-04 | Discharge: 2011-11-07 | Disposition: A | Discharge status: Home / Self Care | Payer: Medicare Other | Attending: Internal Medicine | Admitting: Internal Medicine

## 2011-11-04 ENCOUNTER — Encounter (HOSPITAL_COMMUNITY): Payer: Self-pay

## 2011-11-04 ENCOUNTER — Emergency Department (HOSPITAL_COMMUNITY): Payer: Medicare Other

## 2011-11-04 DIAGNOSIS — Z992 Dependence on renal dialysis: Secondary | ICD-10-CM | POA: Insufficient documentation

## 2011-11-04 DIAGNOSIS — F329 Major depressive disorder, single episode, unspecified: Secondary | ICD-10-CM | POA: Diagnosis present

## 2011-11-04 DIAGNOSIS — D373 Neoplasm of uncertain behavior of appendix: Secondary | ICD-10-CM | POA: Diagnosis present

## 2011-11-04 DIAGNOSIS — K3184 Gastroparesis: Secondary | ICD-10-CM | POA: Diagnosis present

## 2011-11-04 DIAGNOSIS — R197 Diarrhea, unspecified: Secondary | ICD-10-CM | POA: Insufficient documentation

## 2011-11-04 DIAGNOSIS — R110 Nausea: Secondary | ICD-10-CM | POA: Insufficient documentation

## 2011-11-04 DIAGNOSIS — R109 Unspecified abdominal pain: Secondary | ICD-10-CM | POA: Insufficient documentation

## 2011-11-04 DIAGNOSIS — IMO0002 Reserved for concepts with insufficient information to code with codable children: Secondary | ICD-10-CM | POA: Diagnosis present

## 2011-11-04 DIAGNOSIS — N186 End stage renal disease: Secondary | ICD-10-CM

## 2011-11-04 DIAGNOSIS — E119 Type 2 diabetes mellitus without complications: Secondary | ICD-10-CM | POA: Insufficient documentation

## 2011-11-04 DIAGNOSIS — E1165 Type 2 diabetes mellitus with hyperglycemia: Secondary | ICD-10-CM | POA: Diagnosis present

## 2011-11-04 DIAGNOSIS — F32A Depression, unspecified: Secondary | ICD-10-CM | POA: Diagnosis present

## 2011-11-04 DIAGNOSIS — Z79899 Other long term (current) drug therapy: Secondary | ICD-10-CM | POA: Insufficient documentation

## 2011-11-04 DIAGNOSIS — K529 Noninfective gastroenteritis and colitis, unspecified: Secondary | ICD-10-CM | POA: Diagnosis present

## 2011-11-04 DIAGNOSIS — E1122 Type 2 diabetes mellitus with diabetic chronic kidney disease: Secondary | ICD-10-CM | POA: Diagnosis present

## 2011-11-04 DIAGNOSIS — E1169 Type 2 diabetes mellitus with other specified complication: Secondary | ICD-10-CM | POA: Diagnosis present

## 2011-11-04 DIAGNOSIS — IMO0001 Reserved for inherently not codable concepts without codable children: Secondary | ICD-10-CM

## 2011-11-04 DIAGNOSIS — R112 Nausea with vomiting, unspecified: Secondary | ICD-10-CM | POA: Diagnosis present

## 2011-11-04 DIAGNOSIS — Z794 Long term (current) use of insulin: Secondary | ICD-10-CM | POA: Insufficient documentation

## 2011-11-04 DIAGNOSIS — R935 Abnormal findings on diagnostic imaging of other abdominal regions, including retroperitoneum: Secondary | ICD-10-CM | POA: Diagnosis present

## 2011-11-04 DIAGNOSIS — K389 Disease of appendix, unspecified: Secondary | ICD-10-CM | POA: Insufficient documentation

## 2011-11-04 DIAGNOSIS — D126 Benign neoplasm of colon, unspecified: Secondary | ICD-10-CM | POA: Insufficient documentation

## 2011-11-04 DIAGNOSIS — I10 Essential (primary) hypertension: Secondary | ICD-10-CM | POA: Diagnosis present

## 2011-11-04 DIAGNOSIS — K5730 Diverticulosis of large intestine without perforation or abscess without bleeding: Secondary | ICD-10-CM | POA: Insufficient documentation

## 2011-11-04 DIAGNOSIS — M8660 Other chronic osteomyelitis, unspecified site: Secondary | ICD-10-CM | POA: Diagnosis present

## 2011-11-04 DIAGNOSIS — I959 Hypotension, unspecified: Secondary | ICD-10-CM | POA: Diagnosis present

## 2011-11-04 DIAGNOSIS — K5289 Other specified noninfective gastroenteritis and colitis: Principal | ICD-10-CM | POA: Insufficient documentation

## 2011-11-04 DIAGNOSIS — K388 Other specified diseases of appendix: Secondary | ICD-10-CM

## 2011-11-04 DIAGNOSIS — Z6841 Body Mass Index (BMI) 40.0 and over, adult: Secondary | ICD-10-CM | POA: Insufficient documentation

## 2011-11-04 DIAGNOSIS — M8668 Other chronic osteomyelitis, other site: Secondary | ICD-10-CM | POA: Insufficient documentation

## 2011-11-04 HISTORY — DX: End stage renal disease: N18.6

## 2011-11-04 HISTORY — DX: Other specified diseases of appendix: K38.8

## 2011-11-04 HISTORY — DX: Depression, unspecified: F32.A

## 2011-11-04 HISTORY — DX: Gastroparesis: K31.84

## 2011-11-04 HISTORY — DX: End stage renal disease: Z99.2

## 2011-11-04 HISTORY — DX: Major depressive disorder, single episode, unspecified: F32.9

## 2011-11-04 LAB — DIFFERENTIAL
Basophils Absolute: 0 10*3/uL (ref 0.0–0.1)
Basophils Relative: 0 % (ref 0–1)
Eosinophils Absolute: 0.2 10*3/uL (ref 0.0–0.7)
Monocytes Absolute: 0.6 10*3/uL (ref 0.1–1.0)
Monocytes Relative: 6 % (ref 3–12)
Neutro Abs: 7.1 10*3/uL (ref 1.7–7.7)
Neutrophils Relative %: 71 % (ref 43–77)

## 2011-11-04 LAB — COMPREHENSIVE METABOLIC PANEL
AST: 12 U/L (ref 0–37)
Albumin: 3.7 g/dL (ref 3.5–5.2)
BUN: 32 mg/dL — ABNORMAL HIGH (ref 6–23)
Chloride: 91 mEq/L — ABNORMAL LOW (ref 96–112)
Creatinine, Ser: 9.66 mg/dL — ABNORMAL HIGH (ref 0.50–1.10)
Potassium: 3.9 mEq/L (ref 3.5–5.1)
Total Bilirubin: 0.4 mg/dL (ref 0.3–1.2)
Total Protein: 8.2 g/dL (ref 6.0–8.3)

## 2011-11-04 LAB — CBC
MCH: 30 pg (ref 26.0–34.0)
MCHC: 33.1 g/dL (ref 30.0–36.0)
RDW: 15.5 % (ref 11.5–15.5)

## 2011-11-04 LAB — HEPATIC FUNCTION PANEL
AST: 13 U/L (ref 0–37)
Albumin: 3.7 g/dL (ref 3.5–5.2)
Alkaline Phosphatase: 106 U/L (ref 39–117)
Bilirubin, Direct: 0.1 mg/dL (ref 0.0–0.3)
Total Bilirubin: 0.4 mg/dL (ref 0.3–1.2)

## 2011-11-04 LAB — LIPASE, BLOOD: Lipase: 26 U/L (ref 11–59)

## 2011-11-04 MED ORDER — ONDANSETRON HCL 4 MG/2ML IJ SOLN: 4.0000 mg | INTRAMUSCULAR | Status: DC | PRN

## 2011-11-04 MED ORDER — METRONIDAZOLE IN NACL 5-0.79 MG/ML-% IV SOLN: 500.0000 mg | Freq: Once | INTRAVENOUS | Status: DC

## 2011-11-04 MED ORDER — SODIUM CHLORIDE 0.9 % IV SOLN: Freq: Once | INTRAVENOUS | Status: DC

## 2011-11-04 MED ORDER — ONDANSETRON 8 MG PO TBDP
8.0000 mg | ORAL_TABLET | Freq: Once | ORAL | Status: AC | PRN
  Administered 2011-11-04: 8 mg via ORAL
  Filled 2011-11-04: qty 1

## 2011-11-04 MED ORDER — FAMOTIDINE 20 MG PO TABS: 20.0000 mg | ORAL_TABLET | Freq: Two times a day (BID) | ORAL | Status: DC

## 2011-11-04 MED ORDER — PREGABALIN 75 MG PO CAPS
75.0000 mg | ORAL_CAPSULE | Freq: Two times a day (BID) | ORAL | Status: DC
  Administered 2011-11-05 – 2011-11-07 (×6): 75 mg via ORAL
  Filled 2011-11-04 (×6): qty 1

## 2011-11-04 MED ORDER — ENOXAPARIN SODIUM 30 MG/0.3ML ~~LOC~~ SOLN: 30.0000 mg | SUBCUTANEOUS | Status: DC

## 2011-11-04 MED ORDER — CIPROFLOXACIN IN D5W 400 MG/200ML IV SOLN
INTRAVENOUS | Status: AC
  Filled 2011-11-04: qty 200

## 2011-11-04 MED ORDER — CIPROFLOXACIN IN D5W 400 MG/200ML IV SOLN: 400.0000 mg | Freq: Once | INTRAVENOUS | Status: DC

## 2011-11-04 MED ORDER — FAMOTIDINE IN NACL 20-0.9 MG/50ML-% IV SOLN: 20.0000 mg | Freq: Once | INTRAVENOUS | Status: DC

## 2011-11-04 MED ORDER — ONDANSETRON HCL 4 MG PO TABS
4.0000 mg | ORAL_TABLET | ORAL | Status: DC | PRN
  Administered 2011-11-05: 4 mg via ORAL
  Filled 2011-11-04: qty 1

## 2011-11-04 MED ORDER — CIPROFLOXACIN IN D5W 400 MG/200ML IV SOLN
400.0000 mg | INTRAVENOUS | Status: DC
  Filled 2011-11-04: qty 200

## 2011-11-04 MED ORDER — METRONIDAZOLE IN NACL 5-0.79 MG/ML-% IV SOLN
500.0000 mg | Freq: Three times a day (TID) | INTRAVENOUS | Status: DC
  Filled 2011-11-04 (×2): qty 100

## 2011-11-04 MED ORDER — ONDANSETRON HCL 4 MG/2ML IJ SOLN
4.0000 mg | INTRAMUSCULAR | Status: DC | PRN
  Administered 2011-11-04: 4 mg via INTRAMUSCULAR

## 2011-11-04 MED ORDER — CIPROFLOXACIN IN D5W 200 MG/100ML IV SOLN
INTRAVENOUS | Status: AC
  Filled 2011-11-04: qty 100

## 2011-11-04 MED ORDER — METRONIDAZOLE IN NACL 5-0.79 MG/ML-% IV SOLN
INTRAVENOUS | Status: AC
  Filled 2011-11-04: qty 100

## 2011-11-04 MED ORDER — IOHEXOL 300 MG/ML  SOLN
40.0000 mL | Freq: Once | INTRAMUSCULAR | Status: AC | PRN
  Administered 2011-11-04: 40 mL via ORAL

## 2011-11-04 MED ORDER — ONDANSETRON HCL 4 MG PO TABS: 4.0000 mg | ORAL_TABLET | Freq: Four times a day (QID) | ORAL | Status: DC | PRN

## 2011-11-04 MED ORDER — FLORA-Q PO CAPS
1.0000 | ORAL_CAPSULE | Freq: Every day | ORAL | Status: DC
  Administered 2011-11-05 – 2011-11-07 (×4): 1 via ORAL
  Filled 2011-11-04 (×4): qty 1

## 2011-11-04 MED ORDER — ONDANSETRON HCL 4 MG/2ML IJ SOLN
4.0000 mg | INTRAMUSCULAR | Status: DC | PRN
  Filled 2011-11-04: qty 2

## 2011-11-04 MED ORDER — METRONIDAZOLE 500 MG PO TABS
500.0000 mg | ORAL_TABLET | Freq: Three times a day (TID) | ORAL | Status: DC
  Administered 2011-11-05 – 2011-11-07 (×8): 500 mg via ORAL
  Filled 2011-11-04 (×9): qty 1

## 2011-11-04 MED ORDER — CIPROFLOXACIN HCL 250 MG PO TABS
250.0000 mg | ORAL_TABLET | Freq: Two times a day (BID) | ORAL | Status: DC
  Administered 2011-11-05 – 2011-11-07 (×6): 250 mg via ORAL
  Filled 2011-11-04 (×6): qty 1

## 2011-11-04 MED ORDER — TRAMADOL HCL 50 MG PO TABS
50.0000 mg | ORAL_TABLET | Freq: Four times a day (QID) | ORAL | Status: DC | PRN
  Filled 2011-11-04: qty 1

## 2011-11-04 MED ORDER — QUETIAPINE FUMARATE ER 50 MG PO TB24
150.0000 mg | ORAL_TABLET | Freq: Every day | ORAL | Status: DC
  Administered 2011-11-05 – 2011-11-06 (×3): 150 mg via ORAL
  Filled 2011-11-04 (×5): qty 3

## 2011-11-04 MED ORDER — MORPHINE SULFATE 4 MG/ML IJ SOLN
4.0000 mg | INTRAMUSCULAR | Status: DC | PRN
  Administered 2011-11-04: 4 mg via INTRAMUSCULAR
  Filled 2011-11-04: qty 1

## 2011-11-04 NOTE — ED Notes (Signed)
Pt ambulated to bathroom.  Did not inform nurse. Unable to obtain stool culture at this time.

## 2011-11-04 NOTE — ED Notes (Signed)
Unable to obtain iv access at this time, EDP aware.

## 2011-11-04 NOTE — ED Notes (Signed)
Pt reports Zofran has improved nausea slightly.  Pt has tolerated apx 120 cc's of ginger ale with no vomiting.  Pt has not had bowel movement since arriving in department.

## 2011-11-04 NOTE — ED Notes (Signed)
Multiple attempts made to obtain IV access to include 3 attempts made by Dr. Thurnell Garbe with assistance of ultrasound.  Unsuccessful.

## 2011-11-04 NOTE — ED Notes (Signed)
Pt reporting minimal relief from medications for pain and nausea.

## 2011-11-04 NOTE — ED Notes (Signed)
Pt medicated for nausea.  P.o fluids provided and encouraged.  Pt denies any additional needs at present.  No distress noted.

## 2011-11-04 NOTE — ED Notes (Addendum)
Pt transferred to room 332

## 2011-11-04 NOTE — ED Provider Notes (Signed)
History     CSN: [id number]  Arrival date & time 11/04/11  1612   First MD Initiated Contact with Patient 11/04/11 1621      Chief Complaint  Patient presents with  . Diarrhea  . Weakness  . Abdominal Pain     HPI Pt was seen at 1625.  Per pt and family, c/o gradual onset and persistence of constant generalized abd "pain" x2 days.  Has been associated with multiple intermittent episodes of N/V/D, poor PO intake and generalized weakness.  Describes her stool as "watery."  States her symptoms began after she had her usual HD on Friday.  Endorses she was eval at [facility] ED for same last night and "they just gave me some medicines after they did blood work."  States she is not improved today.  Denies black or blood in stools or emesis, no fevers, no back pain, no CP/SOB, no cough, no syncope/near syncope.     PMD in [place] Past Medical History  Diagnosis Date  . Diabetes mellitus   . Osteomyelitis   . Hypertension   . ESRD on hemodialysis   . Gastroparesis     Past Surgical History  Procedure Date  . Back surgery   . Cholecystectomy   . Av fistula placement     left arm  . Pacemaker insertion     for gastroparesis    History  Substance Use Topics  . Smoking status: Never Smoker   . Smokeless tobacco: Not on file  . Alcohol Use: No    Review of Systems ROS: Statement: All systems negative except as marked or noted in the HPI; Constitutional: Negative for fever and chills. ; ; Eyes: Negative for eye pain, redness and discharge. ; ; ENMT: Negative for ear pain, hoarseness, nasal congestion, sinus pressure and sore throat. ; ; Cardiovascular: Negative for chest pain, palpitations, diaphoresis, dyspnea and peripheral edema. ; ; Respiratory: Negative for cough, wheezing and stridor. ; ; Gastrointestinal: +N/V/D, abd pain. Negative for blood in stool, hematemesis, jaundice and rectal bleeding. . ; ; Genitourinary: Negative for dysuria, flank pain and hematuria. ; ;  Musculoskeletal: Negative for back pain and neck pain. Negative for swelling and trauma.; ; Skin: Negative for pruritus, rash, abrasions, blisters, bruising and skin lesion.; ; Neuro: Negative for headache, lightheadedness and neck stiffness. Negative for weakness, altered level of consciousness , altered mental status, extremity weakness, paresthesias, involuntary movement, seizure and syncope.     Allergies  Dilaudid; Hydrocodone-acetaminophen; Tylenol; Ace inhibitors; Alprazolam; Bee venom; and Zolpidem tartrate  Home Medications   Current Outpatient Rx  Name Route Sig Dispense Refill  . AMLODIPINE BESYLATE 10 MG PO TABS Oral Take 10 mg by mouth at bedtime.      . ASPIRIN EC 81 MG PO TBEC Oral Take 81 mg by mouth every morning.      Marland Kitchen CALCIUM ACETATE 667 MG PO CAPS Oral Take 667 mg by mouth 3 (three) times daily.      Marland Kitchen CARVEDILOL 25 MG PO TABS Oral Take 25 mg by mouth 2 (two) times daily with a meal.      . CINACALCET HCL 30 MG PO TABS Oral Take 30 mg by mouth every evening.      Marland Kitchen HYDRALAZINE HCL 50 MG PO TABS Oral Take 50 mg by mouth 4 (four) times daily.      . INSULIN GLARGINE 100 UNIT/ML [place] SOLN Subcutaneous Inject 25 Units into the skin at bedtime.      Marland Kitchen  INSULIN LISPRO (HUMAN) 100 UNIT/ML [place] SOLN Subcutaneous Inject 15 Units into the skin 3 (three) times daily before meals.      Marland Kitchen LEVOFLOXACIN 750 MG PO TABS Oral Take 750 mg by mouth every morning.      Marland Kitchen LISINOPRIL 20 MG PO TABS Oral Take 20 mg by mouth 2 (two) times daily.      Marland Kitchen MEDROXYPROGESTERONE ACETATE 10 MG PO TABS Oral Take 10 mg by mouth as directed. Every other month     . OMEPRAZOLE 20 MG PO CPDR Oral Take 20 mg by mouth every morning.      Marland Kitchen PRAVASTATIN SODIUM 20 MG PO TABS Oral Take 20 mg by mouth every morning.      Marland Kitchen PREGABALIN 75 MG PO CAPS Oral Take 75 mg by mouth 2 (two) times daily.      Marland Kitchen PROMETHAZINE HCL 25 MG RE SUPP Rectal Place 1 suppository (25 mg total) rectally every 6 (six) hours as needed for nausea.  12 each 0  . QUETIAPINE FUMARATE ER 150 MG PO TB24 Oral Take 150 mg by mouth at bedtime.      Marland Kitchen SEVELAMER CARBONATE 800 MG PO TABS Oral Take 800 mg by mouth 3 (three) times daily with meals.      . SULFAMETHOXAZOLE-TMP DS 800-160 MG PO TABS Oral Take 1 tablet by mouth daily.      . TRAMADOL HCL 50 MG PO TABS Oral Take 50 mg by mouth daily as needed. For back  pain       BP 153/97  Pulse 95  Temp(Src) 98.4 F (36.9 C) (Oral)  Resp 18  Ht 5\' 2"  (1.575 m)  Wt 230 lb (104.327 kg)  BMI 42.07 kg/m2  Physical Exam 1630: Physical examination:  Nursing notes reviewed; Vital signs and O2 SAT reviewed;  Constitutional: Well developed, Well nourished, Well hydrated, In no acute distress; Head:  Normocephalic, atraumatic; Eyes: EOMI, PERRL, No scleral icterus; ENMT: Mouth and pharynx normal, Mucous membranes moist; Neck: Supple, Full range of motion, No lymphadenopathy; Cardiovascular: Regular rate and rhythm, No murmur or gallop; Respiratory: Breath sounds clear & equal bilaterally, No rales, rhonchi, wheezes, or rub, Normal respiratory effort/excursion; Chest: Nontender, Movement normal; Abdomen: Soft, +diffuse TTP.  +pacemaker LLQ without overlying erythema or specific tenderness, Nondistended, Normal bowel sounds; Genitourinary: No CVA tenderness; Extremities: Pulses normal, No tenderness, No edema, No calf edema or asymmetry.; Neuro: AA&Ox3, Major CN grossly intact.  No gross focal motor or sensory deficits in extremities.; Skin: Color normal, Warm, Dry, no rash.    ED Course  Procedures   MDM  MDM Reviewed: nursing note, vitals and previous chart Interpretation: labs and CT scan     Results for orders placed during the hospital encounter of 11/04/11  CBC      Component Value Range   WBC 10.1  4.0 - 10.5 (K/uL)   RBC 4.73  3.87 - 5.11 (MIL/uL)   Hemoglobin 14.2  12.0 - 15.0 (g/dL)   HCT 42.9  36.0 - 46.0 (%)   MCV 90.7  78.0 - 100.0 (fL)   MCH 30.0  26.0 - 34.0 (pg)   MCHC 33.1  30.0  - 36.0 (g/dL)   RDW 15.5  11.5 - 15.5 (%)   Platelets 239  150 - 400 (K/uL)  DIFFERENTIAL      Component Value Range   Neutrophils Relative 71  43 - 77 (%)   Neutro Abs 7.1  1.7 - 7.7 (K/uL)   Lymphocytes Relative 21  12 - 46 (%)   Lymphs Abs 2.2  0.7 - 4.0 (K/uL)   Monocytes Relative 6  3 - 12 (%)   Monocytes Absolute 0.6  0.1 - 1.0 (K/uL)   Eosinophils Relative 2  0 - 5 (%)   Eosinophils Absolute 0.2  0.0 - 0.7 (K/uL)   Basophils Relative 0  0 - 1 (%)   Basophils Absolute 0.0  0.0 - 0.1 (K/uL)  COMPREHENSIVE METABOLIC PANEL      Component Value Range   Sodium 136  135 - 145 (mEq/L)   Potassium 3.9  3.5 - 5.1 (mEq/L)   Chloride 91 (*) 96 - 112 (mEq/L)   CO2 26  19 - 32 (mEq/L)   Glucose, Bld 254 (*) 70 - 99 (mg/dL)   BUN 32 (*) 6 - 23 (mg/dL)   Creatinine, Ser 9.66 (*) 0.50 - 1.10 (mg/dL)   Calcium 9.9  8.4 - 10.5 (mg/dL)   Total Protein 8.2  6.0 - 8.3 (g/dL)   Albumin 3.7  3.5 - 5.2 (g/dL)   AST 12  0 - 37 (U/L)   ALT 7  0 - 35 (U/L)   Alkaline Phosphatase 106  39 - 117 (U/L)   Total Bilirubin 0.4  0.3 - 1.2 (mg/dL)   GFR calc non Af Amer 4 (*) >90 (mL/min)   GFR calc Af Amer 5 (*) >90 (mL/min)  LIPASE, BLOOD      Component Value Range   Lipase 26  11 - 59 (U/L)    Ct Abdomen Pelvis Wo Contrast 11/04/2011  **ADDENDUM** CREATED: 11/04/2011 19:20:14  The IMPRESSION should also include the following:  Apparent enlarged appendix which appears fluid density and without adjacent inflammation.  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  **END ADDENDUM** SIGNED BY: Dellis Filbert T. Melanee Spry, M.D.   11/04/2011  *RADIOLOGY REPORT*  Clinical Data: 51 year old female with abdominal and pelvic pain, nausea and vomiting. History of end-stage renal disease.  CT ABDOMEN AND PELVIS WITHOUT CONTRAST  Technique:  Multidetector CT imaging of the abdomen and pelvis was performed following the standard protocol without intravenous contrast.  Comparison: None  Findings: The liver, spleen, adrenal glands and  pancreas are unremarkable. Renal atrophy bilaterally is noted. The patient is status post cholecystectomy.  Please note that parenchymal abnormalities may be missed as intravenous contrast was not administered.  Apparent wall thickening and mild inflammation of the terminal ileum (8 cm length) is noted - images 57-67. There is no evidence of abscess, bowel obstruction or pneumoperitoneum.  What appears to be the appendix is enlarged and fluid density without adjacent inflammation and may represent a mucocele of the appendix (images 61-63). Colonic diverticulosis noted without evidence of diverticulitis. No free fluid, enlarged lymph nodes, biliary dilation or abdominal aortic aneurysm identified.  Mild apparent wall thickening of the bladder is noted which could be secondary to poor distention. No acute or suspicious bony abnormalities are noted. Postsurgical changes/fusion within the lower lumbar spine identified.  There is a pacemaker pack within the anterior abdominal fat with leads tips between the stomach and the liver (images 28 - 31) - correlate clinically.  IMPRESSION: Apparent wall thickening with adjacent inflammation of the terminal ileum likely representing enteritis.  Crohn's disease is not excluded.  Mild apparent wall thickening of the bladder which may be secondary to poor distention.  Correlate with signs of infection/cystitis.  Pacemaker pack with the tips in between the stomach/liver. Correlate clinically.  Renal atrophy.  Original Report Authenticated By: Lura Em, M.D.  Dg Abd Acute W/chest 10/06/2011  *RADIOLOGY REPORT*  Clinical Data: Epigastric pain  ACUTE ABDOMEN SERIES (ABDOMEN 2 VIEW & CHEST 1 VIEW)  Comparison: None.  Findings: Lungs are clear. No pleural effusion or pneumothorax.  The heart is top normal in size.  Nonobstructive bowel gas pattern.  No evidence of free air under the diaphragm on the upright view.  Device overlying the left mid abdomen.  Lumbosacral spine  fixation.  IMPRESSION: No evidence of acute cardiopulmonary disease.  No evidence of small bowel obstruction or free air.  Original Report Authenticated By: Julian Hy, M.D.      [place]:  Pt has tol only a small amount PO after zofran.  Has had several BM's while in ED; cdiff and stool culture sent.  Cipro, flagyl ordered for enteritis.  T/C to [facility] Surgery Dr. Arnoldo Morale, case discussed, including:  HPI, pertinent PM/SHx, VS/PE, dx testing, ED course and treatment:  CT findings of appendix are concerning but pt does not need emergent appendectomy tonight, requests to admit to medicine service, he will consult.  1955:  T/C to [facility] Dr. Megan Salon, case discussed, including:  HPI, pertinent PM/SHx, VS/PE, dx testing, ED course and treatment:  Agreeable to admit, requests to obtain medical bed.       Alfonzo Feller, DO 11/05/11 Evalee Jefferson

## 2011-11-04 NOTE — ED Notes (Signed)
Pt c/o abd pain n/v/d x 2 days.  C/o generalized weakness.  Reports went to [facility] last night for same but says only had some blood work done.  Pt is a dialysis patient and last had dialysis Friday.

## 2011-11-04 NOTE — ED Notes (Signed)
Dr. Megan Salon notified of inability to obtain IV access.

## 2011-11-04 NOTE — ED Notes (Signed)
Patient complained of being dizzy while standing.

## 2011-11-04 NOTE — H&P (Signed)
PCP:  Dr. Shearon Stalls  Nephrologist: Dr. Elenore Rota  Chief Complaint:  Vomiting and diarrhea for 2 days  HPI: Kayla Gibson is an 51 y.o. female.  Obese African American lady with a history of diabetes, hypertension, and end-stage renal disease due to the above, presents with the above symptoms. She was evaluated in [facility] emergency room and treated and discharged. She presents to our emergency room today because she continues to have 10-15 episodes of vomiting and 10-15 episodes of diarrhea loose watery diarrhea per day, and is dissatisfied with her evaluation and treatment in [place].  Since she already had primary treatment and an intolerant emergency room she given antiemetics and had a CT of the abdomen and pelvis which revealed a terminal ileitis and cystic lesion of her appendix. Dr. Geroge Baseman was consulted and apparently feels she would eventually need surgery to rule out malignancy. Hospitalist service was called to assist with management of her acute problems.  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 She says she's permanently maintained on co-trimoxazole because of this remote infection. She denies Levaquin use  Denies black or bloody; denies fever or chills  Despite multiple attempts in the emergency room, including the use of ultrasound,  No IV access has been obtained.  She has an implanted stimulator for gastroparesis  Rewiew of Systems:  The patient denies anorexia, fever, weight loss,, vision loss, decreased hearing, hoarseness, chest pain, syncope, dyspnea on exertion, peripheral edema, balance deficits, hemoptysis, abdominal pain, melena, hematochezia, severe indigestion/heartburn, hematuria, incontinence, genital sores, muscle weakness, suspicious skin lesions, transient  blindness,  depression, unusual weight change, abnormal bleeding, enlarged lymph nodes, angioedema, and breast masses.    Past Medical History  Diagnosis Date  . Diabetes mellitus   . Osteomyelitis 2009  . Hypertension   . ESRD on hemodialysis   . Gastroparesis   . Depression     Past Surgical History  Procedure Date  . Back surgery   . Cholecystectomy   . Av fistula placement     left arm  . Pacemaker insertion     for gastroparesis    Medications:  HOME MEDS: Prior to Admission medications   Medication Sig Start Date End Date Taking? Authorizing Provider  amLODipine (NORVASC) 10 MG tablet Take 10 mg by mouth at bedtime.      Historical Provider, MD  aspirin EC 81 MG tablet Take 81 mg by mouth every morning.      Historical Provider, MD  calcium acetate (PHOSLO) 667 MG capsule Take 667 mg by mouth 3 (three) times daily.      Historical Provider, MD  carvedilol (COREG) 25 MG tablet Take 25 mg by mouth 2 (two) times daily with a meal.      Historical Provider, MD  cinacalcet (SENSIPAR) 30 MG tablet Take 30 mg by mouth every evening.      Historical Provider, MD  hydrALAZINE (APRESOLINE) 50 MG tablet Take 50 mg by mouth 4 (four) times daily.      Historical Provider, MD  insulin glargine (LANTUS) 100 UNIT/ML injection Inject 25 Units into the skin at bedtime.      Historical Provider, MD  insulin lispro (HUMALOG) 100 UNIT/ML injection Inject 15 Units into the skin 3 (three) times daily before meals.      Historical Provider, MD  levofloxacin [facility])  750 MG tablet Take 750 mg by mouth every morning.      Historical Provider, MD  lisinopril (PRINIVIL,ZESTRIL) 20 MG tablet Take 20 mg by mouth 2 (two) times daily.      Historical Provider, MD  omeprazole (PRILOSEC) 20 MG capsule Take 20 mg by mouth every morning.      Historical Provider, MD  pravastatin (PRAVACHOL) 20 MG tablet Take 20 mg by mouth every morning.      Historical Provider, MD  pregabalin (LYRICA) 75 MG capsule  Take 75 mg by mouth 2 (two) times daily.      Historical Provider, MD  promethazine (PHENERGAN) 25 MG suppository Place 1 suppository (25 mg total) rectally every 6 (six) hours as needed for nausea. 10/06/11 10/13/11  Maudry Diego, MD  QUEtiapine Fumarate (SEROQUEL XR) 150 MG 24 hr tablet Take 150 mg by mouth at bedtime.      Historical Provider, MD  sevelamer (RENVELA) 800 MG tablet Take 800 mg by mouth 3 (three) times daily with meals.      Historical Provider, MD  sulfamethoxazole-trimethoprim (BACTRIM DS) 800-160 MG per tablet Take 1 tablet by mouth daily.      Historical Provider, MD  traMADol (ULTRAM) 50 MG tablet Take 50 mg by mouth daily as needed. For back  pain     Historical Provider, MD     Allergies:  Allergies  Allergen Reactions  . Dilaudid (Hydromorphone Hcl) Anaphylaxis  . Hydrocodone-Acetaminophen Anaphylaxis  . Tylenol (Acetaminophen) Anaphylaxis  . Ace Inhibitors   . Alprazolam Other (See Comments)    Hallucination  . Bee Venom Swelling  . Zolpidem Tartrate Other (See Comments)    Hallucinations    Social History:   reports that she has never smoked. She does not have any smokeless tobacco history on file. She reports that she does not drink alcohol or use illicit drugs.  Family History: Family History  Problem Relation Age of Onset  . Cancer Mother 77    stomach  . Diabetes Mother   . Stroke Father 81  . Heart failure Father   . Diabetes Sister   . Hypertension Sister      Physical Exam: Filed Vitals:   11/04/11 1615 11/04/11 1834 11/04/11 1836 11/04/11 1838  BP: 153/97 166/74 156/73 144/81  Pulse: 95 96 97 99  Temp: 98.4 F (36.9 C)     TempSrc: Oral     Resp: 18     Height: 5\' 2"  (1.575 m)     Weight: 104.327 kg (230 lb)      Blood pressure 144/81, pulse 99, temperature 98.4 F (36.9 C), temperature source Oral, resp. rate 18, height 5\' 2"  (1.575 m), weight 104.327 kg (230 lb).  GEN:  Pleasant obese African American lady lying in the  stretcher, ill-looking ; cooperative with exam PSYCH:  alert and oriented x4; does appear anxious HEENT: Mucous membranes pink and anicteric; PERRLA; EOM intact; no cervical lymphadenopathy nor thyromegaly or carotid bruit;  Breasts:: Not examined CHEST WALL: No tenderness CHEST: Normal respiration, clear to auscultation bilaterally HEART: Regular rate and rhythm; no murmurs rubs or gallops BACK: N; no CVA tenderness ABDOMEN: Obese, diffuse mild tenderness no organomegaly, ; large pannus; no intertriginous candida. Rectal Exam: Not done EXTREMITIES: Functioning fistula in left forearm; no edema, no ulcerations. Genitalia: not examined PULSES: 2+ and symmetric SKIN: Normal hydration no rash or ulceration CNS: Cranial nerves 2-12 grossly weakness of the right lower extremity greater distally than proximally   Labs &  Imaging Results for orders placed during the hospital encounter of 11/04/11 (from the past 48 hour(s))  CBC     Status: Normal   Collection Time   11/04/11  6:16 PM      Component Value Range Comment   WBC 10.1  4.0 - 10.5 (K/uL)    RBC 4.73  3.87 - 5.11 (MIL/uL)    Hemoglobin 14.2  12.0 - 15.0 (g/dL)    HCT 42.9  36.0 - 46.0 (%)    MCV 90.7  78.0 - 100.0 (fL)    MCH 30.0  26.0 - 34.0 (pg)    MCHC 33.1  30.0 - 36.0 (g/dL)    RDW 15.5  11.5 - 15.5 (%)    Platelets 239  150 - 400 (K/uL)   DIFFERENTIAL     Status: Normal   Collection Time   11/04/11  6:16 PM      Component Value Range Comment   Neutrophils Relative 71  43 - 77 (%)    Neutro Abs 7.1  1.7 - 7.7 (K/uL)    Lymphocytes Relative 21  12 - 46 (%)    Lymphs Abs 2.2  0.7 - 4.0 (K/uL)    Monocytes Relative 6  3 - 12 (%)    Monocytes Absolute 0.6  0.1 - 1.0 (K/uL)    Eosinophils Relative 2  0 - 5 (%)    Eosinophils Absolute 0.2  0.0 - 0.7 (K/uL)    Basophils Relative 0  0 - 1 (%)    Basophils Absolute 0.0  0.0 - 0.1 (K/uL)   COMPREHENSIVE METABOLIC PANEL     Status: Abnormal   Collection Time   11/04/11  6:16  PM      Component Value Range Comment   Sodium 136  135 - 145 (mEq/L)    Potassium 3.9  3.5 - 5.1 (mEq/L)    Chloride 91 (*) 96 - 112 (mEq/L)    CO2 26  19 - 32 (mEq/L)    Glucose, Bld 254 (*) 70 - 99 (mg/dL)    BUN 32 (*) 6 - 23 (mg/dL)    Creatinine, Ser 9.66 (*) 0.50 - 1.10 (mg/dL)    Calcium 9.9  8.4 - 10.5 (mg/dL)    Total Protein 8.2  6.0 - 8.3 (g/dL)    Albumin 3.7  3.5 - 5.2 (g/dL)    AST 12  0 - 37 (U/L)    ALT 7  0 - 35 (U/L)    Alkaline Phosphatase 106  39 - 117 (U/L)    Total Bilirubin 0.4  0.3 - 1.2 (mg/dL)    GFR calc non Af Amer 4 (*) >90 (mL/min)    GFR calc Af Amer 5 (*) >90 (mL/min)   LIPASE, BLOOD     Status: Normal   Collection Time   11/04/11  6:16 PM      Component Value Range Comment   Lipase 26  11 - 59 (U/L)    Ct Abdomen Pelvis Wo Contrast  11/04/2011  **ADDENDUM** CREATED: 11/04/2011 19:20:14  The IMPRESSION should also include the following:  Apparent enlarged appendix which appears fluid density and without adjacent inflammation.  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  **END ADDENDUM** SIGNED BY: Dellis Filbert T. Melanee Spry, M.D.   11/04/2011  *RADIOLOGY REPORT*  Clinical Data: 51 year old female with abdominal and pelvic pain, nausea and vomiting. History of end-stage renal disease.  CT ABDOMEN AND PELVIS WITHOUT CONTRAST  Technique:  Multidetector CT imaging of the abdomen and pelvis was performed  following the standard protocol without intravenous contrast.  Comparison: None  Findings: The liver, spleen, adrenal glands and pancreas are unremarkable. Renal atrophy bilaterally is noted. The patient is status post cholecystectomy.  Please note that parenchymal abnormalities may be missed as intravenous contrast was not administered.  Apparent wall thickening and mild inflammation of the terminal ileum (8 cm length) is noted - images 57-67. There is no evidence of abscess, bowel obstruction or pneumoperitoneum.  What appears to be the appendix is enlarged and fluid density  without adjacent inflammation and may represent a mucocele of the appendix (images 61-63). Colonic diverticulosis noted without evidence of diverticulitis. No free fluid, enlarged lymph nodes, biliary dilation or abdominal aortic aneurysm identified.  Mild apparent wall thickening of the bladder is noted which could be secondary to poor distention. No acute or suspicious bony abnormalities are noted. Postsurgical changes/fusion within the lower lumbar spine identified.  There is a pacemaker pack within the anterior abdominal fat with leads tips between the stomach and the liver (images 28 - 31) - correlate clinically.  IMPRESSION: Apparent wall thickening with adjacent inflammation of the terminal ileum likely representing enteritis.  Crohn's disease is not excluded.  Mild apparent wall thickening of the bladder which may be secondary to poor distention.  Correlate with signs of infection/cystitis.  Pacemaker pack with the tips in between the stomach/liver. Correlate clinically.  Renal atrophy.  Original Report Authenticated By: Lura Em, M.D.      Assessment Present on Admission:  .Gastroenteritis/enteritis .Appendiceal tumor .ESRD (end stage renal disease) .HTN (hypertension) .Diabetes mellitus type II, uncontrolled .Depression  PLAN: We'll initiate treatment for C. difficile and begin a C. difficile workup;  will hydrate orally, and consult nephrologist service for dialysis management Sliding scale insulin for her diabetes, until she becomes more stable Continue oral medications to the extent that she can tolerate. Followup with surgery for management of an appendiceal tumor  Other plans as per orders.   Fernando Torry 11/04/2011, 9:57 PM

## 2011-11-04 NOTE — Progress Notes (Signed)
ANTIBIOTIC CONSULT NOTE - INITIAL  Pharmacy Consult for Cipro Indication: Gastroenteritis  Allergies  Allergen Reactions  . Dilaudid (Hydromorphone Hcl) Anaphylaxis  . Hydrocodone-Acetaminophen Anaphylaxis  . Tylenol (Acetaminophen) Anaphylaxis  . Ace Inhibitors   . Alprazolam Other (See Comments)    Hallucination  . Bee Venom Swelling  . Zolpidem Tartrate Other (See Comments)    Hallucinations    Patient Measurements: Height: 5\' 2"  (157.5 cm) Weight: 230 lb (104.327 kg) IBW/kg (Calculated) : 50.1   Vital Signs: Temp: 98.4 F (36.9 C) (05/19 1615) Temp src: Oral (05/19 1615) BP: 144/81 mmHg (05/19 1838) Pulse Rate: 99  (05/19 1838) Intake/Output from previous day:   Intake/Output from this shift:    Labs:  [facility] 11/04/11 1816  WBC 10.1  HGB 14.2  PLT 239  LABCREA --  CREATININE 9.66*   Estimated Creatinine Clearance: 8.1 ml/min (by C-G formula based on Cr of 9.66). No results found for this basename: VANCOTROUGH:2,VANCOPEAK:2,VANCORANDOM:2,GENTTROUGH:2,GENTPEAK:2,GENTRANDOM:2,TOBRATROUGH:2,TOBRAPEAK:2,TOBRARND:2,AMIKACINPEAK:2,AMIKACINTROU:2,AMIKACIN:2, in the last 72 hours   Microbiology: No results found for this or any previous visit (from the past 720 hour(s)).  Medical History: Past Medical History  Diagnosis Date  . Diabetes mellitus   . Osteomyelitis 2009  . Hypertension   . ESRD on hemodialysis   . Gastroparesis   . Depression     Medications:   (Not in a hospital admission) Assessment: Renal function CrCl 8.1 ml/min  Goal of Therapy:  Eradicate infection  Plan:  Cipro 400 mg IV every 24 hours Labs per protocol  Abner Greenspan, Brandice Busser Bennett 11/04/2011,10:10 PM

## 2011-11-05 ENCOUNTER — Inpatient Hospital Stay (HOSPITAL_COMMUNITY): Payer: Medicare Other

## 2011-11-05 ENCOUNTER — Encounter (HOSPITAL_COMMUNITY): Payer: Self-pay | Admitting: Internal Medicine

## 2011-11-05 DIAGNOSIS — K388 Other specified diseases of appendix: Secondary | ICD-10-CM

## 2011-11-05 DIAGNOSIS — R112 Nausea with vomiting, unspecified: Secondary | ICD-10-CM | POA: Diagnosis present

## 2011-11-05 DIAGNOSIS — E1165 Type 2 diabetes mellitus with hyperglycemia: Secondary | ICD-10-CM

## 2011-11-05 DIAGNOSIS — K3184 Gastroparesis: Secondary | ICD-10-CM | POA: Diagnosis present

## 2011-11-05 DIAGNOSIS — R935 Abnormal findings on diagnostic imaging of other abdominal regions, including retroperitoneum: Secondary | ICD-10-CM | POA: Diagnosis present

## 2011-11-05 DIAGNOSIS — N186 End stage renal disease: Secondary | ICD-10-CM

## 2011-11-05 DIAGNOSIS — M8660 Other chronic osteomyelitis, unspecified site: Secondary | ICD-10-CM | POA: Diagnosis present

## 2011-11-05 DIAGNOSIS — IMO0001 Reserved for inherently not codable concepts without codable children: Secondary | ICD-10-CM

## 2011-11-05 DIAGNOSIS — K389 Disease of appendix, unspecified: Secondary | ICD-10-CM

## 2011-11-05 DIAGNOSIS — R197 Diarrhea, unspecified: Secondary | ICD-10-CM | POA: Diagnosis present

## 2011-11-05 DIAGNOSIS — K529 Noninfective gastroenteritis and colitis, unspecified: Secondary | ICD-10-CM

## 2011-11-05 HISTORY — DX: Other specified diseases of appendix: K38.8

## 2011-11-05 LAB — ALT: ALT: 7 U/L (ref 0–35)

## 2011-11-05 LAB — BASIC METABOLIC PANEL
Calcium: 9.7 mg/dL (ref 8.4–10.5)
Creatinine, Ser: 11.31 mg/dL — ABNORMAL HIGH (ref 0.50–1.10)
GFR calc non Af Amer: 3 mL/min — ABNORMAL LOW (ref >90)
Sodium: 137 mEq/L (ref 135–145)

## 2011-11-05 LAB — CBC
MCH: 29.7 pg (ref 26.0–34.0)
Platelets: 249 10*3/uL (ref 150–400)
RBC: 4.37 MIL/uL (ref 3.87–5.11)
RDW: 15.6 % — ABNORMAL HIGH (ref 11.5–15.5)
WBC: 9.5 10*3/uL (ref 4.0–10.5)

## 2011-11-05 LAB — HEMOGLOBIN A1C
Hgb A1c MFr Bld: 7.6 % — ABNORMAL HIGH (ref <5.7)
Mean Plasma Glucose: 171 mg/dL — ABNORMAL HIGH (ref <117)

## 2011-11-05 LAB — GLUCOSE, CAPILLARY: Glucose-Capillary: 262 mg/dL — ABNORMAL HIGH (ref 70–99)

## 2011-11-05 LAB — HEPATITIS B SURFACE ANTIGEN: Hepatitis B Surface Ag: NEGATIVE

## 2011-11-05 MED ORDER — INSULIN ASPART 100 UNIT/ML ~~LOC~~ SOLN
0.0000 [IU] | Freq: Three times a day (TID) | SUBCUTANEOUS | Status: DC
  Administered 2011-11-05: 2 [IU] via SUBCUTANEOUS
  Administered 2011-11-05: 5 [IU] via SUBCUTANEOUS
  Administered 2011-11-07 (×2): 4 [IU] via SUBCUTANEOUS

## 2011-11-05 MED ORDER — PANTOPRAZOLE SODIUM 40 MG PO TBEC
40.0000 mg | DELAYED_RELEASE_TABLET | Freq: Every day | ORAL | Status: DC
  Administered 2011-11-05 – 2011-11-07 (×2): 40 mg via ORAL
  Filled 2011-11-05 (×2): qty 1

## 2011-11-05 MED ORDER — HEPARIN SODIUM (PORCINE) 1000 UNIT/ML DIALYSIS
300.0000 [IU] | INTRAMUSCULAR | Status: DC | PRN
  Filled 2011-11-05: qty 1

## 2011-11-05 MED ORDER — QUETIAPINE FUMARATE 100 MG PO TABS
ORAL_TABLET | ORAL | Status: AC
  Filled 2011-11-05: qty 1

## 2011-11-05 MED ORDER — QUETIAPINE FUMARATE 25 MG PO TABS
ORAL_TABLET | ORAL | Status: AC
  Filled 2011-11-05: qty 2

## 2011-11-05 MED ORDER — HEPARIN SODIUM (PORCINE) 5000 UNIT/ML IJ SOLN
5000.0000 [IU] | Freq: Three times a day (TID) | INTRAMUSCULAR | Status: DC
  Administered 2011-11-05 (×2): 5000 [IU] via SUBCUTANEOUS
  Filled 2011-11-05 (×2): qty 1

## 2011-11-05 MED ORDER — INSULIN GLARGINE 100 UNIT/ML ~~LOC~~ SOLN
10.0000 [IU] | Freq: Every day | SUBCUTANEOUS | Status: DC
  Administered 2011-11-05: 10 [IU] via SUBCUTANEOUS

## 2011-11-05 MED ORDER — HEPARIN SODIUM (PORCINE) 1000 UNIT/ML DIALYSIS
20.0000 [IU]/kg | INTRAMUSCULAR | Status: DC | PRN
  Filled 2011-11-05: qty 3

## 2011-11-05 MED ORDER — INSULIN ASPART 100 UNIT/ML ~~LOC~~ SOLN
0.0000 [IU] | Freq: Every day | SUBCUTANEOUS | Status: DC
  Administered 2011-11-06: 3 [IU] via SUBCUTANEOUS

## 2011-11-05 MED ORDER — CARVEDILOL 3.125 MG PO TABS
6.2500 mg | ORAL_TABLET | Freq: Two times a day (BID) | ORAL | Status: DC
  Administered 2011-11-05: 6.25 mg via ORAL
  Filled 2011-11-05: qty 1

## 2011-11-05 MED ORDER — PEG 3350-KCL-NABCB-NACL-NASULF 236 G PO SOLR
4000.0000 mL | Freq: Once | ORAL | Status: AC
  Administered 2011-11-05: 4000 mL via ORAL
  Filled 2011-11-05: qty 4000

## 2011-11-05 NOTE — Progress Notes (Signed)
UR Chart Review Completed  

## 2011-11-05 NOTE — Consult Note (Signed)
[name], Gibson           ACCOUNT NO.:  [id number]  MEDICAL RECORD NO.:  [id number]  LOCATION:  [id number]                          FACILITY:  [facility]  PHYSICIAN:  Hildred Laser, M.D.    DATE OF BIRTH:  [birth year]  DATE OF CONSULTATION:  11/05/2011 DATE OF DISCHARGE:                                CONSULTATION   REASON FOR CONSULTATION:  Abnormal CT with changes of ileitis and the patient also appears to have thickened appendix, question mucocele.  HISTORY OF PRESENT ILLNESS:  Kayla Gibson is 51 year old African American female with multiple medical problems, who has been doing well until 2 days ago when she developed nausea, vomiting, and diarrhea as well as abdominal pain.  She had multiple loose watery stools.  She went to emergency room, in [place], [place].  She was evaluated and discharged.  Her symptoms persisted.  She therefore decided to try another emergency room and this time she came to [facility] Emergency Room.  She was evaluated in emergency room and had abdominopelvic CT which revealed thickening to terminal ileum with inflammatory changes around it as well as and large appendix with fluid density.  No changes of appendicitis were noted.  The patient was admitted to Hospitalist Service.  Consultation has been requested by Dr. Alm Bustard and Dr. Arnoldo Morale of Surgical Service who has seen the patient earlier today.  He feels she may have mucocele appendectomy, but there is no need for urgent intervention.  He wants to make sure she does not have Crohn disease or other colonic findings prior to surgical intervention.  The patient complains of pain primarily in right mid abdomen.  She has been keeping liquids down.  She denies melena or rectal bleeding. Before her diarrhea, she generally would have a bowel movement every other day.  Saturday, she had over 6 bowel movements, and Sunday she had at least 5 or 6 bowel movements.  She denies melena or rectal bleeding. Emesis  consisted primarily of fluid and food without hematemesis.  There is no history of recent antibiotic use and she denies fever.  Her appetite is not good, but she has not lost any weight.  At home, she is on amlodipine, aspirin, PhosLo, carvedilol, Sensipar, hydralazine, Lantus, Humalog, Levaquin, lisinopril, omeprazole, pravastatin, Lyrica, promethazine suppository, Seroquel, Renvela, Bactrim DS, and tramadol.  Presently, she is on carvedilol 6.25 mg p.o. b.i.d., Cipro 250 mg p.o. b.i.d., Flora-Q 1 capsule p.o. daily, aspartate insulin via sliding scale, insulin glargine 10 units subcu q.h.s., metronidazole 500 mg p.o. q.8, pantoprazole 40 mg p.o. daily, pregabalin 75 mg p.o. b.i.d., and quetiapine, Seroquel 150 mg p.o. at bedtime.  Heparin 5000 units subcu q.8, Zofran 4 mg p.o. q.4 p.r.n., tramadol 50 mg p.o. q.6 h. p.r.n.  PAST MEDICAL HISTORY: 1. She has been hypertensive for about 8 years.  She has been diabetic     for 5 years.  She says control has been so-so.  End-stage renal     disease diagnosed 4 years ago and she is on dialysis.  She is     actually getting ready to be dialyzed this afternoon.  Severe     diabetic gastroparesis, and she had gastric stimulator placed in  [place] in 2010, and she and her husband tell me it has     helped a great deal.  She had sepsis secondary to Port-A-Cath that     she had in 2008 was removed.  She developed osteomyelitis in lumbar     spine and had surgery in 2009, and now on antibiotic therapy     chronically. 2. Obesity.  She has had weight problems all her life and peak weight     was 250 pounds.  She has history of depression. 3. Cholecystectomy.  She has AV fistula in her left forearm.  ALLERGIES-HYDROMORPHONE:  To HYDROMORPHONE, HYDROCODONE/ACETAMINOPHEN, ACE INHIBITORS, ALPRAZOLAM, and ZOLPIDEM.  FAMILY HISTORY:  Father died of CVA at age 63.  Mother was diabetic and developed cancer of stomach and died at 63.   She has 1 brother in good health.  She has a sister, age 38 was diabetic.  SOCIAL HISTORY:  She is married and husband is in room with her.  She has 2 children in good health.  She worked as a [employer]ar[employer] manager for 12 years, but now disabled.  She has never smoked cigarettes or drank alcohol.  PHYSICAL EXAMINATION:  VITAL SIGNS:  Admission weight 241 pounds, she is 62 inches tall,  pulse 77 per minute, blood pressure 100/64, respirations 20, and temp is 97.6. HEENT:  Conjunctivae is pink.  Sclerae nonicteric.  Oropharyngeal mucosa is normal.  Dentition in satisfactory condition.  No neck masses or thyromegaly noted. CARDIAC:  With regular rhythm.  Normal S1 and S2.  No murmur or gallop noted. LUNGS:  Clear to auscultation. ABDOMEN:  Full.  Bowel sounds are normal.  Gastric stimulator is noted in mid abdomen to the left midline.  On palpation, soft abdomen, mild tenderness at right mid abdomen.  No organomegaly or masses. RECTAL:  Deferred. EXTREMITIES:  No peripheral edema or clubbing noted.  LABORATORY DATA:  From admission WBC 10.1, H and H 14.2 and 42.9, platelet count 239,000.  Diff is normal.  Serum sodium 136, potassium 3.9, chloride 91, CO2 of 26, glucose 354, BUN 32, creatinine 9.66, calcium 9.9, bilirubin 0.4, AP 106, AST 12, ALT 17, albumin is 3.7. Lipase 26. Stool studies are pending.  CBC from this morning is unremarkable with WBC of 9.5.  Abdominopelvic CT reviewed.  Appendix is expanded with fluid density, no periappendiceal changes of inflammation noted.  Thickened GI with periileal inflammatory changes.  No evidence of ascites.  No adenopathy, colonic diverticulosis without diverticulitis.  ASSESSMENT:  Patient's acute symptomatology appears to be secondary to viral or infectious gastroenteritis and she seemed to be gradually improving.  She is on empiric antibiotic therapy.  CT reveals evidence of terminal ileitis and abnormal appearing appendix raising the possibility of  mucocele.  However, she does not have any symptoms that suggest appendicitis and she has already been evaluated by Dr. Arnoldo Morale, who feels she may need elective surgery.  He is concerned the patient may have inflammatory bowel disease or other colonic/cecal abnormalities and therefore requests that these areas be directly evaluated. Therefore, it would be reasonable to proceed with colonoscopy.  RECOMMENDATIONS:  We will plan diagnostic colonoscopy in a.m.  She will be prepped with PEG solution later today after she returns from dialysis.  I have answered patient,s and her husband's questions and they are both agreeable to proceed this exam. Hold Heparin for the procedure.  We with the opportunity to participate in care of this nice lady.  ______________________________ Hildred Laser, M.D.     NR/MEDQ  D:  11/05/2011  T:  11/05/2011  Job:  [id number]

## 2011-11-05 NOTE — Care Management Note (Unsigned)
    Page 1 of 1   11/06/2011     11:10:06 AM   CARE MANAGEMENT NOTE 11/05/2011  Patient:  [facility]   Account Number:  [id number]  Date Initiated:  11/05/2011  Documentation initiated by:  Claretha Cooper  Subjective/Objective Assessment:   Pt admitted from home where she lives with spouse. Admitted with weakness, diarrhea.     Action/Plan:   Spoke to pt and husband at bedside. She has multiple DME but asked for a tu bench. Other DME comes from [facility] in [place]. Already had [facility] aide.   Anticipated DC Date:  11/07/2011   Anticipated DC Plan:  [place]  CM consult      Choice offered to / List presented to:     DME arranged  [employer] [employer]           Status of service:  In process, will continue to follow Medicare Important Message given?   (If response is "NO", the following Medicare IM given date fields will be blank) Date Medicare IM given:   Date Additional Medicare IM given:    Discharge Disposition:    Per UR Regulation:    If discussed at Long Length of Stay Meetings, dates discussed:    Comments:  11/05/11 1400 AMy Dellia Nims RN BSN CM

## 2011-11-05 NOTE — Consult Note (Signed)
Reason for Consult: Enlarged appendix, possible mucocele of the appendix Referring Physician: Hospitalists  Kayla Gibson is an 51 y.o. female.  HPI: Patient is a 51 year old black female multiple medical problems including morbid obesity, chronic gastroparesis, status post gastric pacemaker placement in [place], end-stage renal disease, and diabetes mellitus who presented with nausea and vomiting. She was originally seen in [place] but change [facility] as she was not being treated like she thought she should have in [place]. CT scan the abdomen and pelvis was performed which revealed a swollen terminal ileum and an enlarged appendix. There is a question of whether there is a mucocele present. She currently denies any right lower corner abdominal pain. She says her right flank and right upper quadrant are somewhat tender, but this is consistent with her history of gastroparesis. Her nausea and vomiting have resolved.  Past Medical History  Diagnosis Date  . Diabetes mellitus   . Osteomyelitis 2009  . Hypertension   . ESRD on hemodialysis   . Gastroparesis   . Depression     Past Surgical History  Procedure Date  . Back surgery   . Cholecystectomy   . Av fistula placement     left arm  . Pacemaker insertion     for gastroparesis    Family History  Problem Relation Age of Onset  . Cancer Mother 56    stomach  . Diabetes Mother   . Stroke Father 70  . Heart failure Father   . Diabetes Sister   . Hypertension Sister     Social History:  reports that she has never smoked. She does not have any smokeless tobacco history on file. She reports that she does not drink alcohol or use illicit drugs.  Allergies:  Allergies  Allergen Reactions  . Dilaudid (Hydromorphone Hcl) Anaphylaxis  . Hydrocodone-Acetaminophen Anaphylaxis  . Tylenol (Acetaminophen) Anaphylaxis  . Ace Inhibitors   . Alprazolam Other (See Comments)    Hallucination  . Bee Venom Swelling  .  Zolpidem Tartrate Other (See Comments)    Hallucinations    Medications: I have reviewed the patient's current medications.  Results for orders placed during the hospital encounter of 11/04/11 (from the past 48 hour(s))  CBC     Status: Normal   Collection Time   11/04/11  6:16 PM      Component Value Range Comment   WBC 10.1  4.0 - 10.5 (K/uL)    RBC 4.73  3.87 - 5.11 (MIL/uL)    Hemoglobin 14.2  12.0 - 15.0 (g/dL)    HCT 42.9  36.0 - 46.0 (%)    MCV 90.7  78.0 - 100.0 (fL)    MCH 30.0  26.0 - 34.0 (pg)    MCHC 33.1  30.0 - 36.0 (g/dL)    RDW 15.5  11.5 - 15.5 (%)    Platelets 239  150 - 400 (K/uL)   DIFFERENTIAL     Status: Normal   Collection Time   11/04/11  6:16 PM      Component Value Range Comment   Neutrophils Relative 71  43 - 77 (%)    Neutro Abs 7.1  1.7 - 7.7 (K/uL)    Lymphocytes Relative 21  12 - 46 (%)    Lymphs Abs 2.2  0.7 - 4.0 (K/uL)    Monocytes Relative 6  3 - 12 (%)    Monocytes Absolute 0.6  0.1 - 1.0 (K/uL)    Eosinophils Relative 2  0 - 5 (%)  Eosinophils Absolute 0.2  0.0 - 0.7 (K/uL)    Basophils Relative 0  0 - 1 (%)    Basophils Absolute 0.0  0.0 - 0.1 (K/uL)   COMPREHENSIVE METABOLIC PANEL     Status: Abnormal   Collection Time   11/04/11  6:16 PM      Component Value Range Comment   Sodium 136  135 - 145 (mEq/L)    Potassium 3.9  3.5 - 5.1 (mEq/L)    Chloride 91 (*) 96 - 112 (mEq/L)    CO2 26  19 - 32 (mEq/L)    Glucose, Bld 254 (*) 70 - 99 (mg/dL)    BUN 32 (*) 6 - 23 (mg/dL)    Creatinine, Ser 9.66 (*) 0.50 - 1.10 (mg/dL)    Calcium 9.9  8.4 - 10.5 (mg/dL)    Total Protein 8.2  6.0 - 8.3 (g/dL)    Albumin 3.7  3.5 - 5.2 (g/dL)    AST 12  0 - 37 (U/L)    ALT 7  0 - 35 (U/L)    Alkaline Phosphatase 106  39 - 117 (U/L)    Total Bilirubin 0.4  0.3 - 1.2 (mg/dL)    GFR calc non Af Amer 4 (*) >90 (mL/min)    GFR calc Af Amer 5 (*) >90 (mL/min)   LIPASE, BLOOD     Status: Normal   Collection Time   11/04/11  6:16 PM      Component Value  Range Comment   Lipase 26  11 - 59 (U/L)   HEPATIC FUNCTION PANEL     Status: Normal   Collection Time   11/04/11 10:38 PM      Component Value Range Comment   Total Protein 8.2  6.0 - 8.3 (g/dL)    Albumin 3.7  3.5 - 5.2 (g/dL)    AST 13  0 - 37 (U/L)    ALT 7  0 - 35 (U/L)    Alkaline Phosphatase 106  39 - 117 (U/L)    Total Bilirubin 0.4  0.3 - 1.2 (mg/dL)    Bilirubin, Direct 0.1  0.0 - 0.3 (mg/dL)    Indirect Bilirubin 0.3  0.3 - 0.9 (mg/dL)   APTT     Status: Normal   Collection Time   11/04/11 10:38 PM      Component Value Range Comment   aPTT 37  24 - 37 (seconds)   PROTIME-INR     Status: Normal   Collection Time   11/04/11 10:38 PM      Component Value Range Comment   Prothrombin Time 14.7  11.6 - 15.2 (seconds)    INR 1.13  0.00 - 1.49    GLUCOSE, CAPILLARY     Status: Abnormal   Collection Time   11/05/11  8:34 AM      Component Value Range Comment   Glucose-Capillary 262 (*) 70 - 99 (mg/dL)    Comment 1 Notify RN     GLUCOSE, CAPILLARY     Status: Abnormal   Collection Time   11/05/11 11:09 AM      Component Value Range Comment   Glucose-Capillary 169 (*) 70 - 99 (mg/dL)    Comment 1 Notify RN       Ct Abdomen Pelvis Wo Contrast  11/04/2011  **ADDENDUM** CREATED: 11/04/2011 19:20:14  The IMPRESSION should also include the following:  Apparent enlarged appendix which appears fluid density and without adjacent inflammation.  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  **END  ADDENDUM** SIGNED BY: Dellis Filbert T. Melanee Spry, M.D.   11/04/2011  *RADIOLOGY REPORT*  Clinical Data: 51 year old female with abdominal and pelvic pain, nausea and vomiting. History of end-stage renal disease.  CT ABDOMEN AND PELVIS WITHOUT CONTRAST  Technique:  Multidetector CT imaging of the abdomen and pelvis was performed following the standard protocol without intravenous contrast.  Comparison: None  Findings: The liver, spleen, adrenal glands and pancreas are unremarkable. Renal atrophy bilaterally is  noted. The patient is status post cholecystectomy.  Please note that parenchymal abnormalities may be missed as intravenous contrast was not administered.  Apparent wall thickening and mild inflammation of the terminal ileum (8 cm length) is noted - images 57-67. There is no evidence of abscess, bowel obstruction or pneumoperitoneum.  What appears to be the appendix is enlarged and fluid density without adjacent inflammation and may represent a mucocele of the appendix (images 61-63). Colonic diverticulosis noted without evidence of diverticulitis. No free fluid, enlarged lymph nodes, biliary dilation or abdominal aortic aneurysm identified.  Mild apparent wall thickening of the bladder is noted which could be secondary to poor distention. No acute or suspicious bony abnormalities are noted. Postsurgical changes/fusion within the lower lumbar spine identified.  There is a pacemaker pack within the anterior abdominal fat with leads tips between the stomach and the liver (images 28 - 31) - correlate clinically.  IMPRESSION: Apparent wall thickening with adjacent inflammation of the terminal ileum likely representing enteritis.  Crohn's disease is not excluded.  Mild apparent wall thickening of the bladder which may be secondary to poor distention.  Correlate with signs of infection/cystitis.  Pacemaker pack with the tips in between the stomach/liver. Correlate clinically.  Renal atrophy.  Original Report Authenticated By: Lura Em, M.D.    ROS: See chart Blood pressure 107/68, pulse 80, temperature 98.2 F (36.8 C), temperature source Oral, resp. rate 20, height 5\' 2"  (1.575 m), weight 108.999 kg (240 lb 4.8 oz), SpO2 95.00%. Physical Exam: Morbidly obese black female in no acute distress. Abdomen: Soft with nonspecific tenderness in the right lower quadrant. She does have mild discomfort in the right upper flank region. No rigidity or masses are noted except for the pacemaker in place in the left  supraumbilical region.   Assessment/Plan: Impression: Enlarged appendix, question mucocele of the appendix. I reviewed the x-rays with the radiology department. It is difficult to state whether she does have a mucocele the appendix. She does have a significantly enlarged appendix, though this could be secondary to Crohn's disease or another process within the cecum and the colon. I've asked Dr. Laural Golden of Gastroenterology for consultation and possible colonoscopy. I have explained to the patient that she may need surgical intervention in the future. She does not need acute surgical intervention at this point. We'll follow her with you.  Hersey Maclellan A 11/05/2011, 12:03 PM

## 2011-11-05 NOTE — Progress Notes (Signed)
Subjective: The patient says that she feels better. She has no nausea and vomiting this morning. Her last bowel movement was loose and early this morning.  Objective: Vital signs in last 24 hours: Filed Vitals:   11/04/11 2300 11/05/11 0422 11/05/11 0755 11/05/11 0920  BP:  147/79  107/68  Pulse: 98 93 82 80  Temp:  98.2 F (36.8 C)  98.2 F (36.8 C)  TempSrc:  Oral    Resp: 20 20  20   Height:      Weight:      SpO2: 95% 96% 90% 95%    Intake/Output Summary (Last 24 hours) at 11/05/11 1316 Last data filed at 11/05/11 1300  Gross per 24 hour  Intake    360 ml  Output    200 ml  Net    160 ml    Weight change:   Physical exam: General: Obese African American 51 year old woman, sitting up in bed, in no acute distress. Her husband is in the room. Lungs: Clear anteriorly with decreased breath sounds in the bases. Heart: S1, S2, with a soft systolic murmur. Abdomen: Obese, positive bowel sounds, soft, mildly to moderately tender at the right lower cautery and, no appreciable distention or hepatosplenomegaly. Pacemaker device palpated at the left lower cautery. Extremities: No pedal edema.  Lab Results: Basic Metabolic Panel:  [facility] 11/04/11 1816  NA 136  K 3.9  CL 91*  CO2 26  GLUCOSE 254*  BUN 32*  CREATININE 9.66*  CALCIUM 9.9  MG --  PHOS --   Liver Function Tests:  [facility] 11/04/11 2238 11/04/11 1816  AST 13 12  ALT 7 7  ALKPHOS 106 106  BILITOT 0.4 0.4  PROT 8.2 8.2  ALBUMIN 3.7 3.7    Basename 11/04/11 1816  LIPASE 26  AMYLASE --   No results found for this basename: AMMONIA:2 in the last 72 hours CBC:  Basename 11/04/11 1816  WBC 10.1  NEUTROABS 7.1  HGB 14.2  HCT 42.9  MCV 90.7  PLT 239   Cardiac Enzymes: No results found for this basename: CKTOTAL:3,CKMB:3,CKMBINDEX:3,TROPONINI:3 in the last 72 hours BNP: No results found for this basename: PROBNP:3 in the last 72 hours D-Dimer: No results found for this basename: DDIMER:2 in  the last 72 hours CBG:  Basename 11/05/11 1109 11/05/11 0834  GLUCAP 169* 262*   Hemoglobin A1C: No results found for this basename: HGBA1C in the last 72 hours Fasting Lipid Panel: No results found for this basename: CHOL,HDL,LDLCALC,TRIG,CHOLHDL,LDLDIRECT in the last 72 hours Thyroid Function Tests: No results found for this basename: TSH,T4TOTAL,FREET4,T3FREE,THYROIDAB in the last 72 hours Anemia Panel: No results found for this basename: VITAMINB12,FOLATE,FERRITIN,TIBC,IRON,RETICCTPCT in the last 72 hours Coagulation:  Basename 11/04/11 2238  LABPROT 14.7  INR 1.13   Urine Drug Screen: Drugs of Abuse  No results found for this basename: labopia, cocainscrnur, labbenz, amphetmu, thcu, labbarb    Alcohol Level: No results found for this basename: ETH:2 in the last 72 hours Urinalysis: No results found for this basename: COLORURINE:2,APPERANCEUR:2,LABSPEC:2,PHURINE:2,GLUCOSEU:2,HGBUR:2,BILIRUBINUR:2,KETONESUR:2,PROTEINUR:2,UROBILINOGEN:2,NITRITE:2,LEUKOCYTESUR:2 in the last 72 hours Misc. Labs:   Micro: No results found for this or any previous visit (from the past 240 hour(s)).  Studies/Results: Ct Abdomen Pelvis Wo Contrast  11/04/2011  **ADDENDUM** CREATED: 11/04/2011 19:20:14  The IMPRESSION should also include the following:  Apparent enlarged appendix which appears fluid density and without adjacent inflammation.  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  **END ADDENDUM** SIGNED BY: Dellis Filbert T. Melanee Spry, M.D.   11/04/2011  *RADIOLOGY REPORT*  Clinical  Data: 51 year old female with abdominal and pelvic pain, nausea and vomiting. History of end-stage renal disease.  CT ABDOMEN AND PELVIS WITHOUT CONTRAST  Technique:  Multidetector CT imaging of the abdomen and pelvis was performed following the standard protocol without intravenous contrast.  Comparison: None  Findings: The liver, spleen, adrenal glands and pancreas are unremarkable. Renal atrophy bilaterally is noted. The  patient is status post cholecystectomy.  Please note that parenchymal abnormalities may be missed as intravenous contrast was not administered.  Apparent wall thickening and mild inflammation of the terminal ileum (8 cm length) is noted - images 57-67. There is no evidence of abscess, bowel obstruction or pneumoperitoneum.  What appears to be the appendix is enlarged and fluid density without adjacent inflammation and may represent a mucocele of the appendix (images 61-63). Colonic diverticulosis noted without evidence of diverticulitis. No free fluid, enlarged lymph nodes, biliary dilation or abdominal aortic aneurysm identified.  Mild apparent wall thickening of the bladder is noted which could be secondary to poor distention. No acute or suspicious bony abnormalities are noted. Postsurgical changes/fusion within the lower lumbar spine identified.  There is a pacemaker pack within the anterior abdominal fat with leads tips between the stomach and the liver (images 28 - 31) - correlate clinically.  IMPRESSION: Apparent wall thickening with adjacent inflammation of the terminal ileum likely representing enteritis.  Crohn's disease is not excluded.  Mild apparent wall thickening of the bladder which may be secondary to poor distention.  Correlate with signs of infection/cystitis.  Pacemaker pack with the tips in between the stomach/liver. Correlate clinically.  Renal atrophy.  Original Report Authenticated By: Lura Em, M.D.    Medications:  Scheduled:   . ciprofloxacin  250 mg Oral BID  . Flora-Q  1 capsule Oral Daily  . heparin subcutaneous  5,000 Units Subcutaneous Q8H  . insulin aspart  0-5 Units Subcutaneous QHS  . insulin aspart  0-9 Units Subcutaneous TID WC  . metroNIDAZOLE  500 mg Oral Q8H  . pantoprazole  40 mg Oral Q1200  . pregabalin  75 mg Oral BID  . QUEtiapine Fumarate  150 mg Oral QHS  . DISCONTD: sodium chloride   Intravenous Once  . DISCONTD: ciprofloxacin  400 mg Intravenous  Once  . DISCONTD: ciprofloxacin  400 mg Intravenous Q24H  . DISCONTD: enoxaparin  30 mg Subcutaneous Q24H  . DISCONTD: famotidine  20 mg Intravenous Once  . DISCONTD: famotidine  20 mg Oral BID  . DISCONTD: metroNIDAZOLE  500 mg Intravenous Once  . DISCONTD: metronidazole  500 mg Intravenous Q8H   Continuous:  [id number], heparin, iohexol, ondansetron, ondansetron, traMADol, DISCONTD:  morphine injection, DISCONTD: ondansetron, DISCONTD: ondansetron, DISCONTD: ondansetron (ZOFRAN) IV, DISCONTD: ondansetron (ZOFRAN) IV, DISCONTD: ondansetron  Assessment: Active Problems:  Gastroenteritis/enteritis  ESRD (end stage renal disease)  HTN (hypertension)  Diabetes mellitus type II, uncontrolled  Depression  Nausea and vomiting  Diarrhea  Gastroparesis  Mucocele of appendix    1. Nausea, vomiting, and diarrhea, presumed to be secondary to an acute gastroenteritis/enteritis/ileitis. She is on Cipro, Flagyl, and Flora Q. She is symptomatically improved. C. difficile PCR and stool culture pending. Gastroenterologist, Dr. Laural Golden was consulted by Dr. Arnoldo Morale to assess inflammatory bowel disease.  Possible mucocele of the appendix. Dr. Arnoldo Morale' assessment noted and appreciated.   Insulin-dependent diabetes mellitus. She is treated with Lantus and Humalog chronically. She is currently on sliding-scale NovoLog only.  Gastroparesis, status post insertion of pacemaker device for treatment.  Hypertension. Currently stable on amlodipine,  carvedilol, hydralazine, and lisinopril chronically. They're currently being held because of low normal blood pressures.  Chronic osteomyelitis. She is on chronic suppressive therapy with Septra. It is currently on hold while she is being treated with Cipro and Flagyl.  Chronic depression. She is on Seroquel chronically.    Plan:  1. Will restart carvedilol at a lower dose and restart other hypertensive medications as appropriate. 2. We'll add a lower dose of  Lantus and titrate accordingly. 3. Will restart Seroquel each bedtime. 4. Gastroenterology consult pending. 5. Will advance diet per the recommendations of general surgery and gastroenterology. For now, will keep on clear liquids.   LOS: 1 day   Aleighya Mcanelly 11/05/2011, 1:16 PM

## 2011-11-05 NOTE — Progress Notes (Signed)
Pt has been stuck multiple times, unable to gain IV access.  Labs drawn during dialysis.

## 2011-11-05 NOTE — Progress Notes (Signed)
Inpatient Diabetes Program Recommendations  AACE/ADA: New Consensus Statement on Inpatient Glycemic Control  Target Ranges:  Prepandial:   less than 140 mg/dL      Peak postprandial:   less than 180 mg/dL (1-2 hours)      Critically ill patients:  140 - 180 mg/dL  Pager:  [phone] Hours:  8 am-10pm   Reason for Visit:  Elevated glucose: 254 mg/dL  Inpatient Diabetes Program Recommendations Insulin - Basal: Add home Lantus 25  units qhs

## 2011-11-05 NOTE — Consult Note (Signed)
Patient interviewed and examined. CT reviewed Consult note dictated. Jobs [phone]  Suspect acute symptoms secondary to gastroenteritis. She may also have appendiceal mucocele. Will proceed with colonoscopy to rule out inflammatory bowel disease.

## 2011-11-05 NOTE — Plan of Care (Signed)
Problem: Phase I Progression Outcomes Goal: Voiding-avoid urinary catheter unless indicated Outcome: Completed/Met Date Met:  11/05/11 Oliguric- Dialysis pt

## 2011-11-05 NOTE — Progress Notes (Signed)
Pt receiving dialysis at this time, will start golytely as soon as treatment is over

## 2011-11-05 NOTE — Consult Note (Signed)
Reason for Consult: End-stage renal disease Referring Physician: Dr. [name] is an 51 y.o. female.  HPI: Patient with her history of diabetes, history of hypertension and end-stage renal disease on maintenance hemodialysis for the last 4 years her presently had came with complaints of her nausea vomiting and also diarrhea. According to the patient she has been seen in emergency room attended she was treated and sent home finally decided to come here for a similar complaint. Presently she is feeling better her last the diarrhea was yesterday it was watery but he doesn't contain any blood. Since the CT scan which was done showed terminal ileitis patient was admitted for further workup and management. Her last dialysis was on Friday.  Past Medical History  Diagnosis Date  . Diabetes mellitus   . Osteomyelitis 2009  . Hypertension   . ESRD on hemodialysis   . Gastroparesis   . Depression     Past Surgical History  Procedure Date  . Back surgery   . Cholecystectomy   . Av fistula placement     left arm  . Pacemaker insertion     for gastroparesis    Family History  Problem Relation Age of Onset  . Cancer Mother 43    stomach  . Diabetes Mother   . Stroke Father 67  . Heart failure Father   . Diabetes Sister   . Hypertension Sister     Social History:  reports that she has never smoked. She does not have any smokeless tobacco history on file. She reports that she does not drink alcohol or use illicit drugs.  Allergies:  Allergies  Allergen Reactions  . Dilaudid (Hydromorphone Hcl) Anaphylaxis  . Hydrocodone-Acetaminophen Anaphylaxis  . Tylenol (Acetaminophen) Anaphylaxis  . Ace Inhibitors   . Alprazolam Other (See Comments)    Hallucination  . Bee Venom Swelling  . Zolpidem Tartrate Other (See Comments)    Hallucinations    Medications: I have reviewed the patient's current medications.  Results for orders placed during the hospital encounter of  11/04/11 (from the past 48 hour(s))  CBC     Status: Normal   Collection Time   11/04/11  6:16 PM      Component Value Range Comment   WBC 10.1  4.0 - 10.5 (K/uL)    RBC 4.73  3.87 - 5.11 (MIL/uL)    Hemoglobin 14.2  12.0 - 15.0 (g/dL)    HCT 42.9  36.0 - 46.0 (%)    MCV 90.7  78.0 - 100.0 (fL)    MCH 30.0  26.0 - 34.0 (pg)    MCHC 33.1  30.0 - 36.0 (g/dL)    RDW 15.5  11.5 - 15.5 (%)    Platelets 239  150 - 400 (K/uL)   DIFFERENTIAL     Status: Normal   Collection Time   11/04/11  6:16 PM      Component Value Range Comment   Neutrophils Relative 71  43 - 77 (%)    Neutro Abs 7.1  1.7 - 7.7 (K/uL)    Lymphocytes Relative 21  12 - 46 (%)    Lymphs Abs 2.2  0.7 - 4.0 (K/uL)    Monocytes Relative 6  3 - 12 (%)    Monocytes Absolute 0.6  0.1 - 1.0 (K/uL)    Eosinophils Relative 2  0 - 5 (%)    Eosinophils Absolute 0.2  0.0 - 0.7 (K/uL)    Basophils Relative 0  0 - 1 (%)  Basophils Absolute 0.0  0.0 - 0.1 (K/uL)   COMPREHENSIVE METABOLIC PANEL     Status: Abnormal   Collection Time   11/04/11  6:16 PM      Component Value Range Comment   Sodium 136  135 - 145 (mEq/L)    Potassium 3.9  3.5 - 5.1 (mEq/L)    Chloride 91 (*) 96 - 112 (mEq/L)    CO2 26  19 - 32 (mEq/L)    Glucose, Bld 254 (*) 70 - 99 (mg/dL)    BUN 32 (*) 6 - 23 (mg/dL)    Creatinine, Ser 9.66 (*) 0.50 - 1.10 (mg/dL)    Calcium 9.9  8.4 - 10.5 (mg/dL)    Total Protein 8.2  6.0 - 8.3 (g/dL)    Albumin 3.7  3.5 - 5.2 (g/dL)    AST 12  0 - 37 (U/L)    ALT 7  0 - 35 (U/L)    Alkaline Phosphatase 106  39 - 117 (U/L)    Total Bilirubin 0.4  0.3 - 1.2 (mg/dL)    GFR calc non Af Amer 4 (*) >90 (mL/min)    GFR calc Af Amer 5 (*) >90 (mL/min)   LIPASE, BLOOD     Status: Normal   Collection Time   11/04/11  6:16 PM      Component Value Range Comment   Lipase 26  11 - 59 (U/L)   HEPATIC FUNCTION PANEL     Status: Normal   Collection Time   11/04/11 10:38 PM      Component Value Range Comment   Total Protein 8.2  6.0 -  8.3 (g/dL)    Albumin 3.7  3.5 - 5.2 (g/dL)    AST 13  0 - 37 (U/L)    ALT 7  0 - 35 (U/L)    Alkaline Phosphatase 106  39 - 117 (U/L)    Total Bilirubin 0.4  0.3 - 1.2 (mg/dL)    Bilirubin, Direct 0.1  0.0 - 0.3 (mg/dL)    Indirect Bilirubin 0.3  0.3 - 0.9 (mg/dL)   APTT     Status: Normal   Collection Time   11/04/11 10:38 PM      Component Value Range Comment   aPTT 37  24 - 37 (seconds)   PROTIME-INR     Status: Normal   Collection Time   11/04/11 10:38 PM      Component Value Range Comment   Prothrombin Time 14.7  11.6 - 15.2 (seconds)    INR 1.13  0.00 - 1.49      Ct Abdomen Pelvis Wo Contrast  11/04/2011  **ADDENDUM** CREATED: 11/04/2011 19:20:14  The IMPRESSION should also include the following:  Apparent enlarged appendix which appears fluid density and without adjacent inflammation.  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  **END ADDENDUM** SIGNED BY: Dellis Filbert T. Melanee Spry, M.D.   11/04/2011  *RADIOLOGY REPORT*  Clinical Data: 51 year old female with abdominal and pelvic pain, nausea and vomiting. History of end-stage renal disease.  CT ABDOMEN AND PELVIS WITHOUT CONTRAST  Technique:  Multidetector CT imaging of the abdomen and pelvis was performed following the standard protocol without intravenous contrast.  Comparison: None  Findings: The liver, spleen, adrenal glands and pancreas are unremarkable. Renal atrophy bilaterally is noted. The patient is status post cholecystectomy.  Please note that parenchymal abnormalities may be missed as intravenous contrast was not administered.  Apparent wall thickening and mild inflammation of the terminal ileum (8 cm length) is noted -  images 57-67. There is no evidence of abscess, bowel obstruction or pneumoperitoneum.  What appears to be the appendix is enlarged and fluid density without adjacent inflammation and may represent a mucocele of the appendix (images 61-63). Colonic diverticulosis noted without evidence of diverticulitis. No free fluid,  enlarged lymph nodes, biliary dilation or abdominal aortic aneurysm identified.  Mild apparent wall thickening of the bladder is noted which could be secondary to poor distention. No acute or suspicious bony abnormalities are noted. Postsurgical changes/fusion within the lower lumbar spine identified.  There is a pacemaker pack within the anterior abdominal fat with leads tips between the stomach and the liver (images 28 - 31) - correlate clinically.  IMPRESSION: Apparent wall thickening with adjacent inflammation of the terminal ileum likely representing enteritis.  Crohn's disease is not excluded.  Mild apparent wall thickening of the bladder which may be secondary to poor distention.  Correlate with signs of infection/cystitis.  Pacemaker pack with the tips in between the stomach/liver. Correlate clinically.  Renal atrophy.  Original Report Authenticated By: Lura Em, M.D.    Review of Systems  Respiratory: Negative for shortness of breath.   Cardiovascular: Negative for leg swelling and PND.  Gastrointestinal: Positive for nausea, vomiting and diarrhea.  Musculoskeletal: Positive for back pain.  Neurological: Negative for weakness.   Blood pressure 147/79, pulse 82, temperature 98.2 F (36.8 C), temperature source Oral, resp. rate 20, height 5\' 2"  (1.575 m), weight 108.999 kg (240 lb 4.8 oz), SpO2 90.00%. Physical Exam  Constitutional: She is oriented to person, place, and time. No distress.  Eyes: No scleral icterus.  Neck: No JVD present.  Cardiovascular: Normal rate and regular rhythm.   No murmur heard. Respiratory: She has no wheezes. She has no rales.  GI: She exhibits no distension.  Musculoskeletal: She exhibits no edema.  Neurological: She is alert and oriented to person, place, and time.    Assessment/Plan: Problem #1 end-stage renal disease she status post hemodialysis on Friday her BUN and creatinine is was in acceptable range normal potassium. Problem #2 history of  nausea vomiting and also diarrhea gastroenteritis etiology not clear. Problem #3 history of hypertension her blood pressure seems to be reasonable Problem #4 history of diabetes #5 history of diabetic gastropathy Problem #6 history of osteomyelitis of her back status post surgery presently wheelchair bound. Problem #7 history of GERD Problem #8 history of hyperphosphatemia she is on a binder. Problem #9 history of depression. Plan: We'll make arrangements for patient to get dialysis today We'll try to use CK 2.5 calcium bath for 3 and half hours. We'll check her basic metabolic panel, phosphorus and CBC in the morning.  Myrle Dues S 11/05/2011, 8:15 AM

## 2011-11-06 ENCOUNTER — Encounter (HOSPITAL_COMMUNITY)
Admission: EM | Disposition: A | Discharge status: Home / Self Care | Payer: Self-pay | Source: Home / Self Care | Attending: Emergency Medicine

## 2011-11-06 DIAGNOSIS — E1165 Type 2 diabetes mellitus with hyperglycemia: Secondary | ICD-10-CM

## 2011-11-06 DIAGNOSIS — K3184 Gastroparesis: Secondary | ICD-10-CM

## 2011-11-06 DIAGNOSIS — K389 Disease of appendix, unspecified: Secondary | ICD-10-CM

## 2011-11-06 DIAGNOSIS — D126 Benign neoplasm of colon, unspecified: Secondary | ICD-10-CM

## 2011-11-06 DIAGNOSIS — IMO0001 Reserved for inherently not codable concepts without codable children: Secondary | ICD-10-CM

## 2011-11-06 DIAGNOSIS — K529 Noninfective gastroenteritis and colitis, unspecified: Secondary | ICD-10-CM

## 2011-11-06 DIAGNOSIS — K5730 Diverticulosis of large intestine without perforation or abscess without bleeding: Secondary | ICD-10-CM

## 2011-11-06 HISTORY — PX: COLONOSCOPY: SHX5424

## 2011-11-06 LAB — GLUCOSE, CAPILLARY
Glucose-Capillary: 247 mg/dL — ABNORMAL HIGH (ref 70–99)
Glucose-Capillary: 217 mg/dL — ABNORMAL HIGH (ref 70–99)
Glucose-Capillary: 292 mg/dL — ABNORMAL HIGH (ref 70–99)

## 2011-11-06 LAB — CBC
HCT: 42.9 % (ref 36.0–46.0)
Hemoglobin: 13.8 g/dL (ref 12.0–15.0)
MCH: 29.9 pg (ref 26.0–34.0)
MCV: 93.1 fL (ref 78.0–100.0)
Platelets: 209 10*3/uL (ref 150–400)
RBC: 4.61 MIL/uL (ref 3.87–5.11)
WBC: 7.5 10*3/uL (ref 4.0–10.5)

## 2011-11-06 LAB — BASIC METABOLIC PANEL
CO2: 27 mEq/L (ref 19–32)
Calcium: 9.8 mg/dL (ref 8.4–10.5)
Chloride: 90 mEq/L — ABNORMAL LOW (ref 96–112)
Glucose, Bld: 220 mg/dL — ABNORMAL HIGH (ref 70–99)
Sodium: 136 mEq/L (ref 135–145)

## 2011-11-06 LAB — CLOSTRIDIUM DIFFICILE BY PCR: Toxigenic C. Difficile by PCR: NEGATIVE

## 2011-11-06 SURGERY — COLONOSCOPY
Anesthesia: Moderate Sedation

## 2011-11-06 MED ORDER — INSULIN GLARGINE 100 UNIT/ML ~~LOC~~ SOLN
10.0000 [IU] | Freq: Two times a day (BID) | SUBCUTANEOUS | Status: DC
  Administered 2011-11-06: 10 [IU] via SUBCUTANEOUS
  Administered 2011-11-06: 10:00:00 via SUBCUTANEOUS
  Administered 2011-11-07: 10 [IU] via SUBCUTANEOUS

## 2011-11-06 MED ORDER — MIDAZOLAM HCL 5 MG/5ML IJ SOLN
INTRAMUSCULAR | Status: DC | PRN
  Administered 2011-11-06 (×2): 2 mg via INTRAVENOUS

## 2011-11-06 MED ORDER — SODIUM CHLORIDE 0.9 % IR SOLN
50.0000 mL | Freq: Once | Status: AC
  Administered 2011-11-06: 50 mL

## 2011-11-06 MED ORDER — CARVEDILOL 3.125 MG PO TABS
3.1250 mg | ORAL_TABLET | Freq: Two times a day (BID) | ORAL | Status: DC
  Administered 2011-11-06 (×2): 3.125 mg via ORAL
  Filled 2011-11-06 (×3): qty 1

## 2011-11-06 MED ORDER — MIDAZOLAM HCL 5 MG/5ML IJ SOLN
INTRAMUSCULAR | Status: AC
  Filled 2011-11-06: qty 10

## 2011-11-06 MED ORDER — MAGNESIUM CITRATE PO SOLN
1.0000 | Freq: Once | ORAL | Status: AC
  Administered 2011-11-06: 1 via ORAL
  Filled 2011-11-06: qty 296

## 2011-11-06 MED ORDER — MEPERIDINE HCL 50 MG/ML IJ SOLN
INTRAMUSCULAR | Status: DC | PRN
  Administered 2011-11-06 (×2): 25 mg via INTRAVENOUS

## 2011-11-06 MED ORDER — STERILE WATER FOR IRRIGATION IR SOLN
Status: DC | PRN
  Administered 2011-11-06: 14:00:00

## 2011-11-06 MED ORDER — MEPERIDINE HCL 50 MG/ML IJ SOLN
INTRAMUSCULAR | Status: AC
  Filled 2011-11-06: qty 1

## 2011-11-06 NOTE — Progress Notes (Signed)
MD aware of decreased BP.  No new orders received at this time.  Will cont to monitor.

## 2011-11-06 NOTE — Progress Notes (Signed)
Subjective: The patient says that she feels better. Her nausea and vomiting has completely resolved. She is streaky GoLYTELY, so she is having loose stools. She still has some abdominal pain at the right lower quadrant, but significantly less.  Objective: Vital signs in last 24 hours: Filed Vitals:   11/06/11 0530 11/06/11 0604 11/06/11 0822 11/06/11 1007  BP:  86/58 107/74 87/60  Pulse: 89  87 88  Temp: 98.4 F (36.9 C)  97.7 F (36.5 C) 98.2 F (36.8 C)  TempSrc: Oral     Resp: 20  18 18   Height:      Weight:  108.727 kg (239 lb 11.2 oz)    SpO2: 100%  100% 96%    Intake/Output Summary (Last 24 hours) at 11/06/11 1235 Last data filed at 11/05/11 1850  Gross per 24 hour  Intake    240 ml  Output   2100 ml  Net  -1860 ml    Weight change: 5.173 kg (11 lb 6.5 oz)  Physical exam: General: Obese African American 51 year old woman, sitting up in bed, in no acute distress. Her husband is in the room. Lungs: Clear anteriorly with decreased breath sounds in the bases. Heart: S1, S2, with a soft systolic murmur. Abdomen: Obese, positive bowel sounds, soft, mildly to moderately tender at the right lower quadrant and, no appreciable distention or hepatosplenomegaly. Pacemaker device palpated at the left lower quadrant. Extremities: No pedal edema.  Lab Results: Basic Metabolic Panel:  Basename 11/06/11 0546 11/05/11 1545  NA 136 137  K 3.6 3.4*  CL 90* 91*  CO2 27 27  GLUCOSE 220* 89  BUN 18 41*  CREATININE 7.24* 11.31*  CALCIUM 9.8 9.7  MG -- --  PHOS 5.9* --   Liver Function Tests:  Basename 11/05/11 1545 11/04/11 2238 11/04/11 1816  AST -- 13 12  ALT 7 7 --  ALKPHOS -- 106 106  BILITOT -- 0.4 0.4  PROT -- 8.2 8.2  ALBUMIN -- 3.7 3.7    Basename 11/04/11 1816  LIPASE 26  AMYLASE --   No results found for this basename: AMMONIA:2 in the last 72 hours CBC:  Basename 11/06/11 0546 11/05/11 1545 11/04/11 1816  WBC 7.5 9.5 --  NEUTROABS -- -- 7.1  HGB 13.8  13.0 --  HCT 42.9 39.9 --  MCV 93.1 91.3 --  PLT 209 249 --   Cardiac Enzymes: No results found for this basename: CKTOTAL:3,CKMB:3,CKMBINDEX:3,TROPONINI:3 in the last 72 hours BNP: No results found for this basename: PROBNP:3 in the last 72 hours D-Dimer: No results found for this basename: DDIMER:2 in the last 72 hours CBG:  Basename 11/06/11 1115 11/06/11 0732 11/05/11 2203 11/05/11 1109 11/05/11 0834  GLUCAP 254* 247* 197* 169* 262*   Hemoglobin A1C:  Basename 11/04/11 2238  HGBA1C 7.6*   Fasting Lipid Panel: No results found for this basename: CHOL,HDL,LDLCALC,TRIG,CHOLHDL,LDLDIRECT in the last 72 hours Thyroid Function Tests:  Basename 11/04/11 2238  TSH 3.039  T4TOTAL --  FREET4 --  T3FREE --  THYROIDAB --   Anemia Panel: No results found for this basename: VITAMINB12,FOLATE,FERRITIN,TIBC,IRON,RETICCTPCT in the last 72 hours Coagulation:  Basename 11/04/11 2238  LABPROT 14.7  INR 1.13   Urine Drug Screen: Drugs of Abuse  No results found for this basename: labopia,  cocainscrnur,  labbenz,  amphetmu,  thcu,  labbarb    Alcohol Level: No results found for this basename: ETH:2 in the last 72 hours Urinalysis: No results found for this basename: COLORURINE:2,APPERANCEUR:2,LABSPEC:2,PHURINE:2,GLUCOSEU:2,HGBUR:2,BILIRUBINUR:2,KETONESUR:2,PROTEINUR:2,UROBILINOGEN:2,NITRITE:2,LEUKOCYTESUR:2 in the last  72 hours Misc. Labs:   Micro: Recent Results (from the past 240 hour(s))  CLOSTRIDIUM DIFFICILE BY PCR     Status: Normal   Collection Time   11/06/11  8:30 AM      Component Value Range Status Comment   C difficile by pcr NEGATIVE  NEGATIVE  Final     Studies/Results: Ct Abdomen Pelvis Wo Contrast  11/04/2011  **ADDENDUM** CREATED: 11/04/2011 19:20:14  The IMPRESSION should also include the following:  Apparent enlarged appendix which appears fluid density and without adjacent inflammation.  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  **END ADDENDUM**  SIGNED BY: Dellis Filbert T. Melanee Spry, M.D.   11/04/2011  *RADIOLOGY REPORT*  Clinical Data: 51 year old female with abdominal and pelvic pain, nausea and vomiting. History of end-stage renal disease.  CT ABDOMEN AND PELVIS WITHOUT CONTRAST  Technique:  Multidetector CT imaging of the abdomen and pelvis was performed following the standard protocol without intravenous contrast.  Comparison: None  Findings: The liver, spleen, adrenal glands and pancreas are unremarkable. Renal atrophy bilaterally is noted. The patient is status post cholecystectomy.  Please note that parenchymal abnormalities may be missed as intravenous contrast was not administered.  Apparent wall thickening and mild inflammation of the terminal ileum (8 cm length) is noted - images 57-67. There is no evidence of abscess, bowel obstruction or pneumoperitoneum.  What appears to be the appendix is enlarged and fluid density without adjacent inflammation and may represent a mucocele of the appendix (images 61-63). Colonic diverticulosis noted without evidence of diverticulitis. No free fluid, enlarged lymph nodes, biliary dilation or abdominal aortic aneurysm identified.  Mild apparent wall thickening of the bladder is noted which could be secondary to poor distention. No acute or suspicious bony abnormalities are noted. Postsurgical changes/fusion within the lower lumbar spine identified.  There is a pacemaker pack within the anterior abdominal fat with leads tips between the stomach and the liver (images 28 - 31) - correlate clinically.  IMPRESSION: Apparent wall thickening with adjacent inflammation of the terminal ileum likely representing enteritis.  Crohn's disease is not excluded.  Mild apparent wall thickening of the bladder which may be secondary to poor distention.  Correlate with signs of infection/cystitis.  Pacemaker pack with the tips in between the stomach/liver. Correlate clinically.  Renal atrophy.  Original Report Authenticated By: Lura Em, M.D.    Medications:  Scheduled:    . carvedilol  3.125 mg Oral BID WC  . ciprofloxacin  250 mg Oral BID  . Flora-Q  1 capsule Oral Daily  . insulin aspart  0-5 Units Subcutaneous QHS  . insulin aspart  0-9 Units Subcutaneous TID WC  . insulin glargine  10 Units Subcutaneous BID  . magnesium citrate  1 Bottle Oral Once  . metroNIDAZOLE  500 mg Oral Q8H  . pantoprazole  40 mg Oral Q1200  . polyethylene glycol  4,000 mL Oral Once  . pregabalin  75 mg Oral BID  . QUEtiapine Fumarate  150 mg Oral QHS  . DISCONTD: carvedilol  6.25 mg Oral BID WC  . DISCONTD: heparin subcutaneous  5,000 Units Subcutaneous Q8H  . DISCONTD: insulin glargine  10 Units Subcutaneous QHS   Continuous:  [id number], heparin, ondansetron, traMADol  Assessment: Active Problems:  Gastroenteritis/enteritis  ESRD (end stage renal disease)  HTN (hypertension)  Diabetes mellitus type II, uncontrolled  Depression  Nausea and vomiting  Diarrhea  Gastroparesis  Mucocele of appendix  Chronic osteomyelitis    1. Nausea, vomiting, and diarrhea, presumed to  be secondary to an acute gastroenteritis/enteritis/ileitis. She is on Cipro, Flagyl, and Flora Q. She is symptomatically improved. C. difficile PCR and stool culture are pending. Gastroenterologist, Dr. Laural Golden was consulted. He will perform a colonoscopy today to assess for inflammatory bowel disease.  Possible mucocele of the appendix. Dr. Arnoldo Morale' assessment noted and appreciated.   Insulin-dependent diabetes mellitus. She is treated with Lantus and Humalog chronically. She is currently on sliding scale NovoLog and Lantus at decreased dosing while she is on a clear liquid diet. Will adjust accordingly.  Gastroparesis, status post insertion of pacemaker device for treatment.  Hypotension with history of Hypertension. She is on amlodipine, carvedilol, hydralazine, and lisinopril chronically. Carvedilol was restarted at a lower dose, but with parameters  to hold for systolic blood pressure less than 100.  End-stage renal disease. Hemodialysis, per nephrology.  Chronic osteomyelitis. She is on chronic suppressive therapy with Septra. It is currently on hold while she is being treated with Cipro and Flagyl.  Chronic depression. She is on Seroquel chronically.    Plan:  1. Colonoscopy today. 2. Continue supportive treatment. 3. Advance diet per GI. 4. Adjust insulin accordingly.   LOS: 2 days   Kayla Gibson 11/06/2011, 12:35 PM

## 2011-11-06 NOTE — Progress Notes (Signed)
Danah Bern  MRN: [id number]  DOB/AGE: [birth year] 49 y.o.  Primary Care Physician:No primary provider on file.  Admit date: 11/04/2011  Chief Complaint:  Chief Complaint  Patient presents with  . Diarrhea  . Weakness  . Abdominal Pain    S-Pt presented on  11/04/2011 with  Chief Complaint  Patient presents with  . Diarrhea  . Weakness  . Abdominal Pain  .  Pt today feels better.Pt says she is waiting for the scope. Pt says her nausea and emesis is better   Meds    . carvedilol  3.125 mg Oral BID WC  . ciprofloxacin  250 mg Oral BID  . Flora-Q  1 capsule Oral Daily  . insulin aspart  0-5 Units Subcutaneous QHS  . insulin aspart  0-9 Units Subcutaneous TID WC  . insulin glargine  10 Units Subcutaneous BID  . magnesium citrate  1 Bottle Oral Once  . metroNIDAZOLE  500 mg Oral Q8H  . pantoprazole  40 mg Oral Q1200  . polyethylene glycol  4,000 mL Oral Once  . pregabalin  75 mg Oral BID  . QUEtiapine Fumarate  150 mg Oral QHS  . DISCONTD: carvedilol  6.25 mg Oral BID WC  . DISCONTD: heparin subcutaneous  5,000 Units Subcutaneous Q8H  . DISCONTD: insulin glargine  10 Units Subcutaneous QHS         [id number] from the symptoms mentioned above,there are no other symptoms referable to all systems reviewed.  Physical Exam: Vital signs in last 24 hours: Temp:  [97.6 F (36.4 C)-98.4 F (36.9 C)] 97.7 F (36.5 C) (05/21 0822) Pulse Rate:  [73-89] 87  (05/21 0822) Resp:  [18-20] 18  (05/21 0822) BP: (77-117)/(55-75) 107/74 mmHg (05/21 0822) SpO2:  [85 %-100 %] 100 % (05/21 0822) Weight:  [236 lb 8.9 oz (107.3 kg)-241 lb 6.5 oz (109.5 kg)] 239 lb 11.2 oz (108.727 kg) (05/21 0604) Weight change: 11 lb 6.5 oz (5.173 kg) Last BM Date: 11/04/11  Intake/Output from previous day: 05/20 0701 - 05/21 0700 In: 360 [P.O.:360] Out: 2100      Physical Exam: General- pt is awake,alert, oriented to time place and person Resp- No acute REsp distress, CTA B/L NO  Rhonchi CVS- S1S2 regular ij rate and rhythm GIT- BS+, soft, NT, ND EXT- NO LE Edema, Cyanosis Access- left AVF +  Lab Results: CBC  Basename 11/06/11 0546 11/05/11 1545  WBC 7.5 9.5  HGB 13.8 13.0  HCT 42.9 39.9  PLT 209 249    BMET  Basename 11/06/11 0546 11/05/11 1545  NA 136 137  K 3.6 3.4*  CL 90* 91*  CO2 27 27  GLUCOSE 220* 89  BUN 18 41*  CREATININE 7.24* 11.31*  CALCIUM 9.8 9.7    MICRO No results found for this or any previous visit (from the past 240 hour(s)).    Lab Results  Component Value Date   CALCIUM 9.8 11/06/2011   PHOS 5.9* 11/06/2011         Impression: 1)Renal   ESRD on HD ON MWF schedule Pt was last dialyzed yesterday No need of HD today  2)HTN Target Organ damage  CKD Medication- On Alpha and beta Blockers   3)Anemia HGb at goal (9--11) NO need of EPO  4)CKD Mineral-Bone Disorder PTH not avail  Secondary Hyperparathyroidism w/u pending. Phosphorus at goal.   5)GI- Admitted with Nausea/Vomiting and Diarrhea         Mucocele /Appendicitis  Primary team/ GI/Surgery following  6)FEN  Normokalemic NOrmonatremic   7)Acid base Co2 at goal     Plan:  Agree withy current tx and plan NO need of HD today Will dialyze in am ( will not use heparin as to have scope and may have polyps/procedure dine)      Kaavya Puskarich S 11/06/2011, 9:27 AM

## 2011-11-06 NOTE — Op Note (Signed)
COLONOSCOPY PROCEDURE REPORT  PATIENT:  Kayla Gibson  MR#:  [id number] Birthdate:  1960/11/09, 51 y.o., female Endoscopist:  Dr. Rogene Houston, MD Referred By:  Dr. Langley Gauss Fisher/ Dr. Aviva Signs. Procedure Date: 11/06/2011  Procedure:   Colonoscopy  Indications: Patient is 51 year old female with multiple medical problems who presents with nausea vomiting diarrhea abdominal pain and found to have changes of ileitis expanded appendix suggestive of mucocele. She is undergoing diagnostic colonoscopy and ileoscopy.  Informed Consent:  The procedure and risks were reviewed with the patient and informed consent was obtained.  Medications:  Demerol 50 mg IV Versed 4 mg IV  Description of procedure:  After a digital rectal exam was performed, that colonoscope was advanced from the anus through the rectum and colon to the area of the cecum, ileocecal valve and appendiceal orifice. The cecum was deeply intubated. These structures were well-seen and photographed for the record. From the level of the cecum and ileocecal valve, the scope was slowly and cautiously withdrawn. The mucosal surfaces were carefully surveyed utilizing scope tip to flexion to facilitate fold flattening as needed. The scope was pulled down into the rectum where a thorough exam including retroflexion was performed. Terminal ileum was also examined.   Findings:   Prep excellent. Terminal ileum examined for 10-15 cm and revealed normal mucosa. Normal mucosa of the cecum and appendiceal orifice. Scattered diverticula throughout the colon but predominantly at the ascending colon. 2 small polyps ablated via cold biopsy one from transverse colon second one from proximal sigmoid colon and were submitted together. Normal rectal mucosa and anorectal junction.   Therapeutic/Diagnostic Maneuvers Performed:  See above  Complications:  None  Cecal Withdrawal Time:  19 minutes  Impression:  Normal terminal ileum, cecum and  appendiceal orifice. Pancolonic diverticulosis. Two small polyps ablated via cold biopsy as above. Suspect acute symptoms secondary to acute gastroenteritis.  Recommendations:  Advance diet. Will discuss findings with Dr. Arnoldo Morale.   [name]  11/06/2011 2:21 PM  CC: Dr. No primary provider on file. & Dr. No ref. provider found

## 2011-11-06 NOTE — Progress Notes (Signed)
Colonoscopy done this am. Results a few polps removed

## 2011-11-07 ENCOUNTER — Observation Stay (HOSPITAL_COMMUNITY): Payer: Medicare Other

## 2011-11-07 DIAGNOSIS — I959 Hypotension, unspecified: Secondary | ICD-10-CM | POA: Diagnosis present

## 2011-11-07 DIAGNOSIS — E1165 Type 2 diabetes mellitus with hyperglycemia: Secondary | ICD-10-CM

## 2011-11-07 DIAGNOSIS — K389 Disease of appendix, unspecified: Secondary | ICD-10-CM

## 2011-11-07 DIAGNOSIS — K3184 Gastroparesis: Secondary | ICD-10-CM

## 2011-11-07 DIAGNOSIS — K529 Noninfective gastroenteritis and colitis, unspecified: Secondary | ICD-10-CM

## 2011-11-07 DIAGNOSIS — IMO0001 Reserved for inherently not codable concepts without codable children: Secondary | ICD-10-CM

## 2011-11-07 LAB — GLUCOSE, CAPILLARY: Glucose-Capillary: 349 mg/dL — ABNORMAL HIGH (ref 70–99)

## 2011-11-07 LAB — BASIC METABOLIC PANEL
BUN: 28 mg/dL — ABNORMAL HIGH (ref 6–23)
CO2: 29 mEq/L (ref 19–32)
Calcium: 9.4 mg/dL (ref 8.4–10.5)
Chloride: 88 mEq/L — ABNORMAL LOW (ref 96–112)
Creatinine, Ser: 9.19 mg/dL — ABNORMAL HIGH (ref 0.50–1.10)
GFR calc Af Amer: 5 mL/min — ABNORMAL LOW (ref >90)
GFR calc non Af Amer: 4 mL/min — ABNORMAL LOW (ref >90)
Glucose, Bld: 378 mg/dL — ABNORMAL HIGH (ref 70–99)
Potassium: 3.4 mEq/L — ABNORMAL LOW (ref 3.5–5.1)
Sodium: 135 mEq/L (ref 135–145)

## 2011-11-07 NOTE — Progress Notes (Signed)
1 Day Post-Op  Subjective: No abdominal pain.  Objective: Vital signs in last 24 hours: Temp:  [97.6 F (36.4 C)-98.3 F (36.8 C)] 97.6 F (36.4 C) (05/22 0519) Pulse Rate:  [78-91] 78  (05/22 0519) Resp:  [11-22] 20  (05/22 0519) BP: (71-147)/(43-83) 126/70 mmHg (05/22 0519) SpO2:  [91 %-100 %] 91 % (05/22 0519) Weight:  [110.3 kg (243 lb 2.7 oz)] 110.3 kg (243 lb 2.7 oz) (05/22 0519) Last BM Date: 11/07/11  Intake/Output from previous day:   Intake/Output this shift:    General appearance: alert, cooperative and no distress GI: soft, non-tender; bowel sounds normal; no masses,  no organomegaly  Lab Results:   [facility] 11/06/11 0546 11/05/11 1545  WBC 7.5 9.5  HGB 13.8 13.0  HCT 42.9 39.9  PLT 209 249   BMET  Basename 11/07/11 0714 11/06/11 0546  NA 135 136  K 3.4* 3.6  CL 88* 90*  CO2 29 27  GLUCOSE 378* 220*  BUN 28* 18  CREATININE 9.19* 7.24*  CALCIUM 9.4 9.8   PT/INR  Basename 11/04/11 2238  LABPROT 14.7  INR 1.13    Studies/Results: No results found.  Anti-infectives: Anti-infectives     Start     Dose/Rate Route Frequency Ordered Stop   11/05/11 0400   metroNIDAZOLE (FLAGYL) IVPB 500 mg  Status:  Discontinued        500 mg 100 mL/hr over 60 Minutes Intravenous Every 8 hours 11/04/11 2257 11/04/11 2329   11/04/11 2345   metroNIDAZOLE (FLAGYL) tablet 500 mg        500 mg Oral 3 times per day 11/04/11 2329     11/04/11 2330   ciprofloxacin (CIPRO) tablet 250 mg        250 mg Oral 2 times daily 11/04/11 2329     11/04/11 2300   ciprofloxacin (CIPRO) IVPB 400 mg  Status:  Discontinued        400 mg 200 mL/hr over 60 Minutes Intravenous Every 24 hours 11/04/11 2205 11/04/11 2329   11/04/11 2000   ciprofloxacin (CIPRO) IVPB 400 mg  Status:  Discontinued        400 mg 200 mL/hr over 60 Minutes Intravenous  Once 11/04/11 1945 11/04/11 2205   11/04/11 2000   metroNIDAZOLE (FLAGYL) IVPB 500 mg  Status:  Discontinued        500 mg 100 mL/hr  over 60 Minutes Intravenous  Once 11/04/11 1945 11/04/11 2329          Assessment/Plan: s/p Procedure(s): COLONOSCOPY Impression: Abdominal pain resolved. Colonoscopy was unremarkable. At this point, she does not need acute surgical intervention. In discussion with Dr. Laural Golden, I feel that a followup CT scan of the abdomen should be performed in 6 weeks given the questionable findings of thickening of the cecum and terminal ileum which were not seen on colonoscopy. The CT also showed a possible mucocele of the appendix, but was not definitive. I have told patient about our findings and need for followup CT scan. She can be discharged after dialysis today. This was discussed also with Dr. Conley Canal.  LOS: 3 days    Kazumi Lachney A 11/07/2011

## 2011-11-07 NOTE — Progress Notes (Signed)
Patient has no complaints. She is finishing up on her hemo.dialysis. She had no difficulty with her breakfast and lunch. She denies abdominal pain. No bowel movement reported today. Preliminary on stool culture is negative. Biopsy of colonic polyps is pending. I would contact patient with biopsy results and recommendations regarding timing of her next colonoscopy. She should have followup abdominopelvic CT in 6 weeks as recommended by Dr. Arnoldo Morale.

## 2011-11-07 NOTE — Progress Notes (Signed)
Progress note Reason for followup end-stage renal disease.  She is a patient who has history of end-stage renal disease, hypertension and diabetes presently and came with nausea vomiting and diarrhea. Patient presently feels good and she denies any complaints.  On examination her blood pressure is under 122/70 temperature 97.6 febrile Chest is clear to auscultation no rales no rhonchi no egophony Heart exam regular rate and rhythm Abdomen soft positive bowel sound Extremities no edema  Problem #1 End-stage renal disease she status post hemodialysis on Monday her BUN is 28 creatinine is 9.1. Her potassium is 3.4. Problem #2 hypertension blood pressure seems to be reasonably controlled Problem #3 anemia her hemoglobin and hematocrit is high Problem #4 history of gastroenteritis that has improved Problem #5 history of diabetes Problem #6 BMD patient phosphorus was in acceptable range normal calcium. Plan: We'll make arrangements for patient to get dialysis today We'll use 3K/ 2.5 calcium bath Once patient is discharged she'll go back to regular outpatient hemodialysis unit.

## 2011-11-07 NOTE — Progress Notes (Signed)
To be discharged this evening after dialysisi

## 2011-11-08 ENCOUNTER — Encounter (HOSPITAL_COMMUNITY): Payer: Self-pay | Admitting: Internal Medicine

## 2011-11-10 LAB — STOOL CULTURE

## 2011-11-16 ENCOUNTER — Encounter (INDEPENDENT_AMBULATORY_CARE_PROVIDER_SITE_OTHER): Payer: Self-pay | Admitting: *Deleted

## 2011-11-18 NOTE — Discharge Summary (Signed)
Physician Discharge Summary  Patient ID: Kayla Gibson MRN: [id number] DOB/AGE: [birth year] 51 y.o.  Admit date: 11/04/2011 Discharge date: 11/18/2011  Discharge Diagnoses:  Principal Problem:  *Gastroenteritis/enteritis Active Problems:  Abnormal CT of the abdomen  ESRD (end stage renal disease)  HTN (hypertension)  Diabetes mellitus type II, uncontrolled  Gastroparesis  Hypotension  Depression  Chronic osteomyelitis   Medication List  As of 11/18/2011  4:57 PM   STOP taking these medications         amLODipine 10 MG tablet      hydrALAZINE 50 MG tablet      levofloxacin 750 MG tablet      lisinopril 20 MG tablet         TAKE these medications         aspirin EC 81 MG tablet   Take 81 mg by mouth every morning.      calcium acetate 667 MG capsule   Commonly known as: PHOSLO   Take 667 mg by mouth 3 (three) times daily.      carvedilol 25 MG tablet   Commonly known as: COREG   Take 25 mg by mouth 2 (two) times daily with a meal.      cinacalcet 30 MG tablet   Commonly known as: SENSIPAR   Take 30 mg by mouth every evening.      insulin glargine 100 UNIT/ML injection   Commonly known as: LANTUS   Inject 25 Units into the skin at bedtime.      insulin lispro 100 UNIT/ML injection   Commonly known as: HUMALOG   Inject 15 Units into the skin 3 (three) times daily before meals.      omeprazole 20 MG capsule   Commonly known as: PRILOSEC   Take 20 mg by mouth every morning.      pravastatin 20 MG tablet   Commonly known as: PRAVACHOL   Take 20 mg by mouth every morning.      pregabalin 75 MG capsule   Commonly known as: LYRICA   Take 75 mg by mouth 2 (two) times daily.      promethazine 25 MG suppository   Commonly known as: PHENERGAN   Place 1 suppository (25 mg total) rectally every 6 (six) hours as needed for nausea.      QUEtiapine Fumarate 150 MG 24 hr tablet   Commonly known as: SEROQUEL XR   Take 150 mg by mouth at bedtime.      sevelamer  800 MG tablet   Commonly known as: RENVELA   Take 800 mg by mouth 3 (three) times daily with meals.      sulfamethoxazole-trimethoprim 800-160 MG per tablet   Commonly known as: BACTRIM DS   Take 1 tablet by mouth daily.      traMADol 50 MG tablet   Commonly known as: ULTRAM   Take 50 mg by mouth daily as needed. For back  pain            Discharge Orders    Future Appointments: Provider: Department: Dept Phone: Center:   12/17/2011 9:30 AM [facility] [phone]  H     Future Orders Please Complete By Expires   Diet - low sodium heart healthy      Diet Carb Modified      Activity as tolerated - No restrictions         Follow-up Information    Follow up with Jamesetta So, MD. Schedule an appointment as soon  as possible for a visit in 7 weeks. (For follow up after CT scan)    Contact information:   [address] [place] [phone]       Follow up with Dr. Shearon Stalls in 1 week. (to check blood pressure)       Please follow up. (be at [facility] on 12/17/2011 at 0915. Nothing to eat or drink after midnight the night before. go by on sunday and pick up a bottle of contrast to be consumed that morning)          Disposition: 01-Home or Self Care  Discharged Condition: stable  Consults: Treatment Team:  Harriett Sine, MD Jamesetta So, MD Rogene Houston, MD  Labs:    Sodium   136 136  135       Potassium   3.9 3.6  3.4       Chloride   91 90  88       CO2   26 27  29        Mean Plasma Glucose   171          BUN   32 18   28       Creatinine, Ser   9.66 7.24  9.19       Calcium   9.9 9.8  9.4       GFR calc non Af Amer   4 6  4        GFR calc Af Amer   5  7   5         Glucose, Bld   254 220  378       Phosphorus    5.9         Alkaline Phosphatase   106          Albumin   3.7          Lipase   26          AST   13          ALT   7 7         Total Protein   8.2          Bilirubin,  Direct   0.1          Indirect Bilirubin   0.3          Total Bilirubin   0.4           CBC    WBC   10.1 7.5         RBC   4.73 4.61         Hemoglobin   14.2 13.8         HCT   42.9 42.9         MCV   90.7 93.1         MCH   30.0 29.9         MCHC   33.1 32.2         RDW   15.5 15.8         Platelets   239 209          DIFFERENTIAL    Neutrophils Relative   71          Lymphocytes Relative   21          Monocytes Relative   6          Eosinophils Relative   2  Basophils Relative   0          Neutro Abs   7.1          Lymphs Abs   2.2          Monocytes Absolute   0.6          Eosinophils Absolute   0.2          Basophils Absolute   0.0           PROTIME W/ INR    Prothrombin Time   14.7          INR   1.13           PTT    aPTT   37            DIABETES    Hemoglobin A1C   =6.5% Diagnostic of Diabetes Mellitus (if abnormal result is confirmed) 5.7-6.4% Increased risk of developing Diabetes Mellitus References:Diagnosis and Classification of Diabetes Mellitus,Diabetes [id number] 1):S62-S69 and Standards of Medical Care in ..."7.6 =6.5% Diagnostic of Diabetes Mellitus (if abnormal result is confirmed) 5.7-6.4% Increased risk of developing Diabetes Mellitus References:Diagnosis and Classification of Diabetes Mellitus,Diabetes [id number] 1):S62-S69 and Standards of Medical Care in ..." border=0 src="[email or web address]:///[email or web address]:/PROGRAM%[email or web address]%20([email or web address]/[email or web address]/EN-US/[email or web address]" width=5 height=10          Glucose, Bld   254 220  378        THYROID    TSH   3.039           HEPATITIS B    Hepatitis B Surface Ag    NEGATIVE         Hep B C IgM    NEGATIVE           STOOL TESTS    C difficile by pcr     NEGATIVE    Diagnostics:  Ct Abdomen Pelvis Wo Contrast  11/04/2011  **ADDENDUM** CREATED: 11/04/2011 19:20:14  The IMPRESSION should also include the following:  Apparent enlarged appendix which appears fluid density and without adjacent  inflammation.  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  **END ADDENDUM** SIGNED BY: Dellis Filbert T. Melanee Spry, M.D.   11/04/2011  *RADIOLOGY REPORT*  Clinical Data: 51 year old female with abdominal and pelvic pain, nausea and vomiting. History of end-stage renal disease.  CT ABDOMEN AND PELVIS WITHOUT CONTRAST  Technique:  Multidetector CT imaging of the abdomen and pelvis was performed following the standard protocol without intravenous contrast.  Comparison: None  Findings: The liver, spleen, adrenal glands and pancreas are unremarkable. Renal atrophy bilaterally is noted. The patient is status post cholecystectomy.  Please note that parenchymal abnormalities may be missed as intravenous contrast was not administered.  Apparent wall thickening and mild inflammation of the terminal ileum (8 cm length) is noted - images 57-67. There is no evidence of abscess, bowel obstruction or pneumoperitoneum.  What appears to be the appendix is enlarged and fluid density without adjacent inflammation and may represent a mucocele of the appendix (images 61-63). Colonic diverticulosis noted without evidence of diverticulitis. No free fluid, enlarged lymph nodes, biliary dilation or abdominal aortic aneurysm identified.  Mild apparent wall thickening of the bladder is noted which could be secondary to poor distention. No acute or suspicious bony abnormalities are noted. Postsurgical changes/fusion within the lower lumbar spine identified.  There is a pacemaker pack within the anterior abdominal fat with leads tips between the stomach and the liver (images 28 - 31) - correlate clinically.  IMPRESSION: Apparent wall thickening with adjacent inflammation of the terminal ileum  likely representing enteritis.  Crohn's disease is not excluded.  Mild apparent wall thickening of the bladder which may be secondary to poor distention.  Correlate with signs of infection/cystitis.  Pacemaker pack with the tips in between the stomach/liver. Correlate  clinically.  Renal atrophy.  Original Report Authenticated By: Lura Em, M.D.   Procedures: Colonoscopy which showed normal terminal ileum cecum and appendiceal orifice, pancolonic diverticulosis, 2 small polyps ablated via cold biopsy  Hospital Course:  See H&P for complete admission details. Ms. Prestwich is a pleasant 51 year old black female from [place] with multiple medical problems including end-stage renal disease. She presented to the emergency room with vomiting and diarrhea for several days. CT of the abdomen showed thickening of the terminal ileum and a possible cystic lesion of the appendix. Surgery was consulted by ED physician. and recommended admission to the hospitalist. Her blood pressure initially was 144/81. Pulse 99. Afebrile. She was anxious appearing, appeared ill. Diffuse mild tenderness. White blood cell count was normal. Liver function tests and lipase unremarkable.  She was admitted and treated for gastroenteritis. She was started initially on antibiotics empirically. Nephrology was consulted for hemodialysis orders. Surgery recommended gastroenterology consult for colonoscopy to further evaluate the appearance of thickened ileum on CAT scan. Colonoscopy showed no abnormalities. Patient vomiting and diarrhea resolved. Her diet was advanced. Surgery recommends repeating a CAT scan in 4-6 weeks and then possible surgery if the abnormality suggestive of appendiceal mucocele is still seen on CAT scan.  During the hospitalization, patient's blood pressure was borderline low and I felt most of her antihypertensives for now. She will need close followup with her primary care physician for blood pressure check. Total time on the day of discharge greater than 30 minutes.  Discharge Exam:  Blood pressure 153/86, pulse 92, temperature 97.8 F (36.6 C), temperature source Oral, resp. rate 18, height 5\' 2"  (1.575 m), weight 110 kg (242 lb 8.1 oz), SpO2 100.00%.  General:  Comfortable. Lungs clear to auscultation bilaterally without wheeze rhonchi or rales Cardiovascular regular rate rhythm without murmurs gallops rubs Abdomen obese soft nontender nondistended Extremities no clubbing cyanosis or edema   Signed: Random Dobrowski L 11/18/2011, 4:57 PM

## 2011-11-20 ENCOUNTER — Encounter (HOSPITAL_COMMUNITY): Payer: Self-pay

## 2011-11-20 ENCOUNTER — Emergency Department (HOSPITAL_COMMUNITY)
Admission: EM | Admit: 2011-11-20 | Discharge: 2011-11-20 | Disposition: A | Discharge status: Home / Self Care | Payer: Medicare Other | Attending: Emergency Medicine | Admitting: Emergency Medicine

## 2011-11-20 DIAGNOSIS — K5289 Other specified noninfective gastroenteritis and colitis: Secondary | ICD-10-CM | POA: Insufficient documentation

## 2011-11-20 DIAGNOSIS — Z794 Long term (current) use of insulin: Secondary | ICD-10-CM | POA: Insufficient documentation

## 2011-11-20 DIAGNOSIS — Z992 Dependence on renal dialysis: Secondary | ICD-10-CM | POA: Insufficient documentation

## 2011-11-20 DIAGNOSIS — K3184 Gastroparesis: Secondary | ICD-10-CM | POA: Insufficient documentation

## 2011-11-20 DIAGNOSIS — N186 End stage renal disease: Secondary | ICD-10-CM | POA: Insufficient documentation

## 2011-11-20 DIAGNOSIS — Z79899 Other long term (current) drug therapy: Secondary | ICD-10-CM | POA: Insufficient documentation

## 2011-11-20 DIAGNOSIS — E1149 Type 2 diabetes mellitus with other diabetic neurological complication: Secondary | ICD-10-CM | POA: Insufficient documentation

## 2011-11-20 DIAGNOSIS — I120 Hypertensive chronic kidney disease with stage 5 chronic kidney disease or end stage renal disease: Secondary | ICD-10-CM | POA: Insufficient documentation

## 2011-11-20 DIAGNOSIS — Z7982 Long term (current) use of aspirin: Secondary | ICD-10-CM | POA: Insufficient documentation

## 2011-11-20 DIAGNOSIS — R112 Nausea with vomiting, unspecified: Secondary | ICD-10-CM

## 2011-11-20 MED ORDER — METOCLOPRAMIDE HCL 5 MG/ML IJ SOLN
10.0000 mg | Freq: Once | INTRAMUSCULAR | Status: AC
  Administered 2011-11-20: 10 mg via INTRAVENOUS
  Filled 2011-11-20: qty 2

## 2011-11-20 MED ORDER — SODIUM CHLORIDE 0.9 % IV BOLUS (SEPSIS)
500.0000 mL | INTRAVENOUS | Status: AC
  Administered 2011-11-20: 500 mL via INTRAVENOUS

## 2011-11-20 MED ORDER — MORPHINE SULFATE 2 MG/ML IJ SOLN
2.0000 mg | Freq: Once | INTRAMUSCULAR | Status: AC
  Administered 2011-11-20: 2 mg via INTRAVENOUS
  Filled 2011-11-20: qty 1

## 2011-11-20 MED ORDER — PROMETHAZINE HCL 25 MG RE SUPP: 25.0000 mg | Freq: Four times a day (QID) | RECTAL | Status: DC | PRN

## 2011-11-20 MED ORDER — ONDANSETRON HCL 4 MG/2ML IJ SOLN
4.0000 mg | Freq: Once | INTRAMUSCULAR | Status: AC
  Administered 2011-11-20: 4 mg via INTRAVENOUS
  Filled 2011-11-20: qty 2

## 2011-11-20 MED ORDER — PROMETHAZINE HCL 25 MG PO TABS: 12.5000 mg | ORAL_TABLET | Freq: Four times a day (QID) | ORAL | Status: DC | PRN

## 2011-11-20 NOTE — Discharge Instructions (Signed)
Use the nausea medicine as needed. Eat bland food for the next 24 hours then progress as you can tolerate.   Gastroparesis  Gastroparesis is also called slowed stomach emptying (delayed gastric emptying). It is a condition in which the stomach takes too long to empty its contents. It often happens in people with diabetes.  CAUSES  Gastroparesis happens when nerves to the stomach are damaged or stop working. When the nerves are damaged, the muscles of the stomach and intestines do not work normally. The movement of food is slowed or stopped. High blood glucose (sugar) causes changes in nerves and can damage the blood vessels that carry oxygen and nutrients to the nerves. RISK FACTORS  Diabetes.   Post-viral syndromes.   Eating disorders (anorexia, bulimia).   Surgery on the stomach or vagus nerve.   Gastroesophageal reflux disease (rarely).   Smooth muscle disorders (amyloidosis, scleroderma).   Metabolic disorders, including hypothyroidism.   Parkinson's disease.  SYMPTOMS   Heartburn.   Feeling sick to your stomach (nausea).   Vomiting of undigested food.   An early feeling of fullness when eating.   Weight loss.   Abdominal bloating.   Erratic blood glucose levels.   Lack of appetite.   Gastroesophageal reflux.   Spasms of the stomach wall.  Complications can include:  Bacterial overgrowth in stomach. Food stays in the stomach and can ferment and cause bacteria to grow.   Weight loss due to difficulty digesting and absorbing nutrients.   Vomiting.   Obstruction in the stomach. Undigested food can harden and cause nausea and vomiting.   Blood glucose fluctuations caused by inconsistent food absorption.  DIAGNOSIS  The diagnosis of gastroparesis is confirmed through one or more of the following tests:  Barium X-rays and scans. These tests look at how long it takes for food to move through the stomach.   Gastric manometry. This test measures electrical and  muscular activity in the stomach. A thin tube is passed down the throat into the stomach. The tube contains a wire that takes measurements of the stomach's electrical and muscular activity as it digests liquids and solid food.   Endoscopy. This procedure is done with a long, thin tube called an endoscope. It is passed through the mouth and gently guides down the esophagus into the stomach. This tube helps the caregiver look at the lining of the stomach to check for any abnormalities.   Ultrasound. This can rule out gallbladder disease or pancreatitis. This test will outline and define the shape of the gallbladder and pancreas.  TREATMENT   The primary treatment is to identify the problem and help control blood glucose levels. Treatments include:   Exercise.   Medicines to control nausea and vomiting.   Medicines to stimulate stomach muscles.   Changes in what and when you eat.   Having smaller meals more often.   Eating low-fiber forms of high-fiber foods, such aseating cooked vegetables instead of raw vegetables.   Eating low-fat foods.   Consuming liquids, which are easier to digest.   In severe cases, feeding tubes and intravenous (IV) feeding may be needed.  It is important to note that in most cases, treatment does not cure gastroparesis. It is usually a lasting (chronic) condition. Treatment helps you manage the condition so that you can be as healthy and comfortable as possible. NEW TREATMENTS  A gastric neurostimulator has been developed to assist people with gastroparesis. The battery-operated device is surgically implanted. It emits mild electrical pulses  to help improve stomach emptying and to control nausea and vomiting.   The use of botulinum toxin has been shown to improve stomach emptying by decreasing the prolonged contractions of the muscle between the stomach and the small intestine (pyloric sphincter). The benefits are temporary.  SEEK MEDICAL CARE IF:   You are  having problems keeping your blood glucose in goal range.   You are having nausea, vomiting, bloating, or early feelings of fullness with eating.   Your symptoms do not change with a change in diet.  Document Released: 06/04/2005 Document Revised: 05/24/2011 Document Reviewed: 11/11/2008 [facility] Patient Information 2012 [place].

## 2011-11-20 NOTE — ED Notes (Signed)
Had dialysis yesterday, started vomiting around 7:30 pm last night and have been vomiting off and on since.

## 2011-11-20 NOTE — ED Notes (Signed)
In t discharge pt. Pt states she does not fel any better and she wants to be admit. EDP aware. Pt to be discharged home.

## 2011-11-20 NOTE — ED Notes (Signed)
Pt still insisting she needs to be admit. Pt becoming anxious,

## 2011-11-20 NOTE — ED Notes (Addendum)
Pt reports nausea and vomiting began about 730 last night.  Presently pt actively vomiting.

## 2011-11-22 NOTE — ED Provider Notes (Signed)
History     CSN: [id number]  Arrival date & time 11/20/11  0321   First MD Initiated Contact with Patient 11/20/11 [phone]      Chief Complaint  Patient presents with  . Emesis  . Abdominal Pain    (Consider location/radiation/quality/duration/timing/severity/associated sxs/prior treatment) HPI Kayla Gibson is a 51 y.o. female who presents to the Emergency Department complaining of persistent vomiting since 7:30 PM. Patient has a h/o gastroparesis and has had several visits to the ER for the same. She has no medicines for nausea at home.    Past Medical History  Diagnosis Date  . Diabetes mellitus   . Osteomyelitis 2009  . Hypertension   . ESRD on hemodialysis   . Gastroparesis   . Depression   . 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 Back surgery   . Cholecystectomy   . Av fistula placement     left arm  . Pacemaker insertion     for gastroparesis  . Colonoscopy 11/06/2011    Procedure: COLONOSCOPY;  Surgeon: Rogene Houston, MD;  Location: [facility];  Service: Endoscopy;  Laterality: N/A;    Family History  Problem Relation Age of Onset  . Cancer Mother 38    stomach  . Diabetes Mother   . Stroke Father 2  . Heart failure Father   . Diabetes Sister   . Hypertension Sister     History  Substance Use Topics  . Smoking status: Never Smoker   . Smokeless tobacco: Not on file  . Alcohol Use: No    OB History    Grav Para Term Preterm Abortions TAB SAB Ect Mult Living                  Review of Systems  Constitutional: Negative for fever.       10 Systems reviewed and are negative for acute change except as noted in the HPI.  HENT: Negative for congestion.   Eyes: Negative for discharge and redness.  Respiratory: Negative for cough and shortness of breath.   Cardiovascular: Negative for chest pain.  Gastrointestinal: Positive for nausea and vomiting. Negative for abdominal pain.  Musculoskeletal: Negative  for back pain.  Skin: Negative for rash.  Neurological: Negative for syncope, numbness and headaches.  Psychiatric/Behavioral:       No behavior change.    Allergies  Dilaudid; Hydrocodone-acetaminophen; Tylenol; Ace inhibitors; Alprazolam; Bee venom; and Zolpidem tartrate  Home Medications   Current Outpatient Rx  Name Route Sig Dispense Refill  . ASPIRIN EC 81 MG PO TBEC Oral Take 81 mg by mouth every morning.      Marland Kitchen CALCIUM ACETATE 667 MG PO CAPS Oral Take 667 mg by mouth 3 (three) times daily.      Marland Kitchen CARVEDILOL 25 MG PO TABS Oral Take 25 mg by mouth 2 (two) times daily with a meal.      . CINACALCET HCL 30 MG PO TABS Oral Take 30 mg by mouth every evening.      . INSULIN GLARGINE 100 UNIT/ML [place] SOLN Subcutaneous Inject 25 Units into the skin at bedtime.      . INSULIN LISPRO (HUMAN) 100 UNIT/ML [place] SOLN Subcutaneous Inject 15 Units into the skin 3 (three) times daily before meals.      . OMEPRAZOLE 20 MG PO CPDR Oral Take 20 mg by mouth every morning.      Marland Kitchen PRAVASTATIN SODIUM 20 MG PO TABS  Oral Take 20 mg by mouth every morning.      Marland Kitchen PREGABALIN 75 MG PO CAPS Oral Take 75 mg by mouth 2 (two) times daily.      . QUETIAPINE FUMARATE ER 150 MG PO TB24 Oral Take 150 mg by mouth at bedtime.      Marland Kitchen SEVELAMER CARBONATE 800 MG PO TABS Oral Take 800 mg by mouth 3 (three) times daily with meals.      . SULFAMETHOXAZOLE-TMP DS 800-160 MG PO TABS Oral Take 1 tablet by mouth daily.      . TRAMADOL HCL 50 MG PO TABS Oral Take 50 mg by mouth daily as needed. For back  pain     . PROMETHAZINE HCL 25 MG RE SUPP Rectal Place 1 suppository (25 mg total) rectally every 6 (six) hours as needed for nausea. 12 each 0  . PROMETHAZINE HCL 25 MG RE SUPP Rectal Place 1 suppository (25 mg total) rectally every 6 (six) hours as needed for nausea. 12 each 0  . PROMETHAZINE HCL 25 MG PO TABS Oral Take 0.5 tablets (12.5 mg total) by mouth every 6 (six) hours as needed for nausea. 10 tablet 0    BP 119/86   Pulse 87  Temp(Src) 98.5 F (36.9 C) (Oral)  Resp 18  Ht 5\' 2"  (1.575 m)  Wt 230 lb (104.327 kg)  BMI 42.07 kg/m2  SpO2 98%  Physical Exam  Nursing note and vitals reviewed. Constitutional: She appears well-developed and well-nourished.       Actively vomiting  HENT:  Head: Atraumatic.  Eyes: Right eye exhibits no discharge. Left eye exhibits no discharge.  Neck: Neck supple.  Cardiovascular: Normal rate, regular rhythm, normal heart sounds and intact distal pulses.   Pulmonary/Chest: Effort normal and breath sounds normal. She exhibits no tenderness.  Abdominal: Soft. Bowel sounds are normal. There is no tenderness. There is no rebound.  Musculoskeletal: She exhibits no tenderness.       Baseline ROM, no obvious new focal weakness.  Neurological:       Mental status and motor strength appears baseline for patient and situation.  Skin: No rash noted.  Psychiatric: She has a normal mood and affect.    ED Course  Procedures (including critical care time)  Labs Reviewed - No data to display No results found.   1. Gastroparesis   2. Nausea and vomiting       MDM  Patient with a history of gastroparesis here with nausea and vomiting. Given IV fluids, antibiotics, PPI, analgesic. Vomiting resolved. He was able to take sips of by mouth fluids.Pt stable in ED with no significant deterioration in condition.The patient appears reasonably screened and/or stabilized for discharge and I doubt any other medical condition or other [facility] requiring further screening, evaluation, or treatment in the ED at this time prior to discharge.  MDM Reviewed: nursing note and vitals           Gypsy Balsam. [name], MD 11/22/11 [id number]

## 2011-12-17 ENCOUNTER — Ambulatory Visit (HOSPITAL_COMMUNITY)
Admit: 2011-12-17 | Discharge: 2011-12-17 | Disposition: A | Discharge status: Home / Self Care | Payer: Medicare Other | Source: Ambulatory Visit | Attending: Internal Medicine | Admitting: Internal Medicine

## 2011-12-17 DIAGNOSIS — K5730 Diverticulosis of large intestine without perforation or abscess without bleeding: Secondary | ICD-10-CM | POA: Insufficient documentation

## 2011-12-17 DIAGNOSIS — R933 Abnormal findings on diagnostic imaging of other parts of digestive tract: Secondary | ICD-10-CM | POA: Insufficient documentation

## 2011-12-17 MED ORDER — IOHEXOL 300 MG/ML  SOLN
100.0000 mL | Freq: Once | INTRAMUSCULAR | Status: AC | PRN
  Administered 2011-12-17: 100 mL via INTRAVENOUS

## 2012-01-03 ENCOUNTER — Inpatient Hospital Stay: Payer: MEDICARE

## 2012-01-03 LAB — POC H. PYLORI, TISSUE
H. pylori from tissue: NEGATIVE
Lot no.: 116112
Negative control: NEGATIVE
Positive control: POSITIVE

## 2012-01-03 MED ORDER — SODIUM CHLORIDE 0.9 % IJ SYRG
Freq: Three times a day (TID) | INTRAMUSCULAR | Status: DC
Start: 2012-01-03 — End: 2012-01-03

## 2012-01-03 MED ORDER — MIDAZOLAM 1 MG/ML IJ SOLN
1 mg/mL | INTRAMUSCULAR | Status: DC | PRN
Start: 2012-01-03 — End: 2012-01-03
  Administered 2012-01-03 (×2): via INTRAVENOUS

## 2012-01-03 MED ORDER — SODIUM CHLORIDE 0.9 % IJ SYRG
INTRAMUSCULAR | Status: DC | PRN
Start: 2012-01-03 — End: 2012-01-03

## 2012-01-03 MED ORDER — FENTANYL CITRATE (PF) 50 MCG/ML IJ SOLN
50 mcg/mL | INTRAMUSCULAR | Status: DC | PRN
Start: 2012-01-03 — End: 2012-01-03
  Administered 2012-01-03 (×2): via INTRAVENOUS

## 2012-01-03 MED ORDER — SIMETHICONE 40 MG/0.6 ML ORAL DROPS, SUSP
40 mg/0.6 mL | ORAL | Status: DC | PRN
Start: 2012-01-03 — End: 2012-01-03

## 2012-01-03 MED ORDER — FLUMAZENIL 0.1 MG/ML IV SOLN
0.1 mg/mL | INTRAVENOUS | Status: DC | PRN
Start: 2012-01-03 — End: 2012-01-03

## 2012-01-03 MED ORDER — SODIUM CHLORIDE 0.9 % IV
INTRAVENOUS | Status: DC
Start: 2012-01-03 — End: 2012-01-03
  Administered 2012-01-03: 18:00:00 via INTRAVENOUS

## 2012-01-03 MED ORDER — NALOXONE 0.4 MG/ML INJECTION
0.4 mg/mL | INTRAMUSCULAR | Status: DC | PRN
Start: 2012-01-03 — End: 2012-01-03

## 2012-01-03 MED ORDER — BENZOCAINE 20 % MUCOSAL AEROSOL SPRAY
20 % | Status: DC | PRN
Start: 2012-01-03 — End: 2012-01-03

## 2012-01-03 MED FILL — SODIUM CHLORIDE 0.9 % IV: INTRAVENOUS | Qty: 1000

## 2012-01-03 MED FILL — FENTANYL CITRATE (PF) 50 MCG/ML IJ SOLN: 50 mcg/mL | INTRAMUSCULAR | Qty: 5

## 2012-01-03 MED FILL — MIDAZOLAM 1 MG/ML IJ SOLN: 1 mg/mL | INTRAMUSCULAR | Qty: 10

## 2012-01-03 NOTE — Procedures (Signed)
Procedures  by Briscoe Deutscher, MD at 01/03/12 1339                Author: Briscoe Deutscher, MD  Service: --  Author Type: Physician       Filed: 01/03/12 1340  Date of Service: 01/03/12 1339  Status: Signed          Editor: Briscoe Deutscher, MD (Physician)            Pre-procedure Diagnoses        1. Abdominal pain, epigastric [789.06]        2. Nausea with vomiting [787.01]                           Post-procedure Diagnoses        1. Acute gastritis without mention of hemorrhage [535.00]                           Procedures        1. UPPER GI ENDOSCOPY,BIOPSY [16109 (CPT??)]                              [facility]   [address]   [place], [place] [address]                    Operator:  Briscoe Deutscher, MD      Referring Provider: Not On File      Sedation:  Versed 3 mg IV and Fentanyl 100 mcg IV            Prior to the procedure its objectives, risks, consequences and alternatives were discussed with the patient who then elected to proceed.  The patient had the opportunity to ask questions and those questions were answered. A physical exam was performed. The heart, lungs, and mental status were examined prior to the procedure and found to be satisfactory for conscious sedation and for the  procedure. Conscious sedation was initiated by the physician. Continuous pulse oximetry and blood pressure monitoring were used throughout the procedure.       After appropriate pharyngeal anesthesia, the endoscope was passed into the esophagus without difficulty. The proximal and distal esophagus are normal. The scope was passed into the stomach without difficulty. The fundus is normal. In the body and antrum,  there is mild gastritis. A biopsy was obtained for Pyloritek without problems from the body and antrum. The pylorus, bulb and postbulbar area  are unremarkable. It should be noted, there is very little peristalsis in the stomach. On slow withdrawal of the scope, no abnormalities were  noted.  Retroflexion revealed normal cardia and fundus.  She tolerated the procedure without complications and will be discharged later today.      Specimen Removed:  Biopsy for Pyloritek was obtained      Complications: None.       EBL:  None.      Briscoe Deutscher, MD   01/03/2012  1:39 PM

## 2012-01-03 NOTE — Other (Signed)
BALI LYN  05-09-1961  [id number]    Situation:  Verbal report received from: G. Elita Boone  Procedure: Procedure(s) with comments:  ESOPHAGOGASTRODUODENOSCOPY (EGD) - EGD  ESOPHAGOGASTRODUODENAL (EGD) BIOPSY    Background:    Preoperative diagnosis: NAUSEA    Postoperative diagnosis: gastricparesis    Operator:  Dr. Briscoe Deutscher, MD    Assistant(s): Jill Alexanders A Resto - Endoscopy Technician-1  Rosey Bath, RN - Endoscopy RN-1    Specimens: * No specimens in log *  H. Plyori  yes    Assessment:  Intra-procedure medications   Versed 3 mg  Demerol 0 mg  Fentanyl 100 mcg    Intravenous fluids: NS@ KVO     Vital signs stable     Abdominal assessment: round and soft     Recommendation:  Discharge patient per MD order.    Family or Friend  Husband  Permission to share finding with family or friend yes

## 2012-01-03 NOTE — Procedures (Signed)
[facility]  [address]  [place], [place] [address]               Operator:  Briscoe Deutscher, MD    Referring Provider: Not On File    Sedation:  Versed 3 mg IV and Fentanyl 100 mcg IV        Prior to the procedure its objectives, risks, consequences and alternatives were discussed with the patient who then elected to proceed. The patient had the opportunity to ask questions and those questions were answered. A physical exam was performed. The heart, lungs, and mental status were examined prior to the procedure and found to be satisfactory for conscious sedation and for the procedure. Conscious sedation was initiated by the physician. Continuous pulse oximetry and blood pressure monitoring were used throughout the procedure.     After appropriate pharyngeal anesthesia, the endoscope was passed into the esophagus without difficulty. The proximal and distal esophagus are normal. The scope was passed into the stomach without difficulty. The fundus is normal. In the body and antrum, there is mild gastritis. A biopsy was obtained for Pyloritek without problems from the body and antrum. The pylorus, bulb and postbulbar area are unremarkable. It should be noted, there is very little peristalsis in the stomach. On slow withdrawal of the scope, no abnormalities were noted. Retroflexion revealed normal cardia and fundus.  She tolerated the procedure without complications and will be discharged later today.    Specimen Removed:  Biopsy for Pyloritek was obtained    Complications: None.     EBL:  None.    Briscoe Deutscher, MD  01/03/2012  1:39 PM

## 2012-01-03 NOTE — H&P (Signed)
[facility]  [address]  [place], [place] [address]                 Karen Fuller is a  51 y.o. African American female who presents with nausea, vomiting and abdominal pain. She has known gastroparesis with a gastric pacemaker. Her symptoms are worse for some reason.      Past Medical History   Diagnosis Date   ??? Reflux 01/12/2009   ??? DM (diabetes mellitus) 01/12/2009   ??? HTN (hypertension) 01/12/2009   ??? Chronic kidney failure 01/12/2009   ??? Abdominal pain 01/12/2009   ??? Nausea & vomiting 01/12/2009   ??? Gastroparesis 01/12/2009   ??? GERD (gastroesophageal reflux disease)    ??? Renal disease    ??? Anemia NEC    ??? Musculoskeletal disorder    ??? Depression    ??? Calculus of kidney      Past Surgical History   Procedure Date   ??? Hx cesarean section      x2   ??? Hx cholecystectomy      Allergies   Allergen Reactions   ??? Lortab (Hydrocodone-Acetaminophen) Other (comments)     Stopped Breathing     ??? Tylenol (Acetaminophen) Other (comments)     Stopped Breathing   ??? Ambien (Zolpidem) Other (comments)     Hallucinations   ??? Hydromorphone Shortness of Breath     Current Facility-Administered Medications   Medication Dose Route Frequency Provider Last Rate Last Dose   ??? 0.9% sodium chloride infusion  50 mL/hr IntraVENous CONTINUOUS Briscoe Deutscher, MD       ??? sodium chloride (NS) flush 5-10 mL  5-10 mL IntraVENous Q8H Briscoe Deutscher, MD       ??? sodium chloride (NS) flush 5-10 mL  5-10 mL IntraVENous PRN Briscoe Deutscher, MD       ??? midazolam (VERSED) injection 1-10 mg  1-10 mg IntraVENous MULTIPLE DOSE GIVEN Briscoe Deutscher, MD       ??? fentaNYL citrate (PF) injection 25-100 mcg  25-100 mcg IntraVENous Multiple Briscoe Deutscher, MD       ??? naloxone [facility]) injection 0.4 mg  0.4 mg IntraVENous Multiple Briscoe Deutscher, MD       ??? flumazenil (ROMAZICON) 0.1 mg/mL injection 0.2 mg  0.2 mg IntraVENous Multiple Briscoe Deutscher, MD       ??? benzocaine (HURRICANE) 20 % spray   Mucous Membrane PRN Briscoe Deutscher, MD       ???  simethicone (MYLICON) 40mg /0.43mL oral drops 80 mg  1.2 mL Oral Multiple Briscoe Deutscher, MD           Visit Vitals   Item Reading   ??? BP 142/114   ??? Pulse 73   ??? Resp 14   ??? Ht 5\' 2"  (1.575 m)   ??? Wt 104.327 kg (230 lb)   ??? BMI 42.07 kg/m2   ??? SpO2 98%           PHYSICAL EXAM:  General: WD, WN. Alert, cooperative, no acute distress????  HEENT: NC, Atraumatic.  PERRLA, EOMI. Anicteric sclerae. Mallampati score 2  Lungs:  CTA Bilaterally. No Wheezing/Rhonchi/Rales.  Heart:  Regular  rhythm,?? No murmur (), No Rubs, No Gallops  Abdomen: Soft, Non distended, Non tender. ??+Bowel sounds, no HSM  Extremities: No c/c/e  Neurologic:?? CN 2-12 gi, Alert and oriented X 3.  No acute neurological distress   Psych:???? Good insight.??Not anxious  nor agitated.    Plan:   Endoscopic procedure with conscious sedation.    Briscoe Deutscher, MD  01/03/2012  1:26 PM

## 2012-04-08 ENCOUNTER — Encounter (HOSPITAL_COMMUNITY): Payer: Self-pay

## 2012-04-08 ENCOUNTER — Emergency Department (HOSPITAL_COMMUNITY)
Admission: EM | Admit: 2012-04-08 | Discharge: 2012-04-09 | Disposition: A | Discharge status: Home / Self Care | Payer: Medicare Other | Attending: Emergency Medicine | Admitting: Emergency Medicine

## 2012-04-08 DIAGNOSIS — F329 Major depressive disorder, single episode, unspecified: Secondary | ICD-10-CM | POA: Insufficient documentation

## 2012-04-08 DIAGNOSIS — E1149 Type 2 diabetes mellitus with other diabetic neurological complication: Secondary | ICD-10-CM | POA: Insufficient documentation

## 2012-04-08 DIAGNOSIS — Z7982 Long term (current) use of aspirin: Secondary | ICD-10-CM | POA: Insufficient documentation

## 2012-04-08 DIAGNOSIS — K3184 Gastroparesis: Secondary | ICD-10-CM | POA: Insufficient documentation

## 2012-04-08 DIAGNOSIS — I120 Hypertensive chronic kidney disease with stage 5 chronic kidney disease or end stage renal disease: Secondary | ICD-10-CM | POA: Insufficient documentation

## 2012-04-08 DIAGNOSIS — Z794 Long term (current) use of insulin: Secondary | ICD-10-CM | POA: Insufficient documentation

## 2012-04-08 DIAGNOSIS — R1110 Vomiting, unspecified: Secondary | ICD-10-CM

## 2012-04-08 DIAGNOSIS — N186 End stage renal disease: Secondary | ICD-10-CM

## 2012-04-08 DIAGNOSIS — Z9089 Acquired absence of other organs: Secondary | ICD-10-CM | POA: Insufficient documentation

## 2012-04-08 DIAGNOSIS — Z79899 Other long term (current) drug therapy: Secondary | ICD-10-CM | POA: Insufficient documentation

## 2012-04-08 DIAGNOSIS — F32A Depression, unspecified: Secondary | ICD-10-CM

## 2012-04-08 DIAGNOSIS — E1165 Type 2 diabetes mellitus with hyperglycemia: Secondary | ICD-10-CM

## 2012-04-08 DIAGNOSIS — IMO0002 Reserved for concepts with insufficient information to code with codable children: Secondary | ICD-10-CM

## 2012-04-08 DIAGNOSIS — F3289 Other specified depressive episodes: Secondary | ICD-10-CM | POA: Insufficient documentation

## 2012-04-08 DIAGNOSIS — Z9889 Other specified postprocedural states: Secondary | ICD-10-CM | POA: Insufficient documentation

## 2012-04-08 MED ORDER — PANTOPRAZOLE SODIUM 40 MG IV SOLR: 40.0000 mg | Freq: Once | INTRAVENOUS | Status: DC

## 2012-04-08 MED ORDER — ONDANSETRON HCL 4 MG/2ML IJ SOLN: 4.0000 mg | Freq: Once | INTRAMUSCULAR | Status: DC

## 2012-04-08 MED ORDER — SODIUM CHLORIDE 0.9 % IV SOLN: Freq: Once | INTRAVENOUS | Status: DC

## 2012-04-08 MED ORDER — SODIUM CHLORIDE 0.9 % IV BOLUS (SEPSIS): 1000.0000 mL | Freq: Once | INTRAVENOUS | Status: DC

## 2012-04-08 MED ORDER — MORPHINE SULFATE 4 MG/ML IJ SOLN: 2.0000 mg | Freq: Once | INTRAMUSCULAR | Status: DC

## 2012-04-08 NOTE — ED Notes (Signed)
Attempted x 2 without success, to rt hand and rt ant forearm

## 2012-04-08 NOTE — ED Notes (Signed)
Stomach pain, nausea, and vomiting that started today per pt.

## 2012-04-08 NOTE — ED Provider Notes (Signed)
History  This chart was scribed for [employer]. [name], MD by Jenne Campus. This patient was seen in room APA05/APA05 and the patient's care was started at 11:04PM.  CSN: [id number]  Arrival date & time 04/08/12  2148   First MD Initiated Contact with Patient 04/08/12 2304      Chief Complaint  Patient presents with  . Abdominal Pain  . Nausea  . Emesis     The history is provided by the patient and the spouse. No language interpreter was used.   Kayla Gibson is a 51 y.o. 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 Pt's husband reports that the pt was seen in a [facility] ED for the same 3 days ago and states that she was admitted for 4 days for the same 2 weeks ago. Husband also reports that the pt has been taking her gastroparesis medications as prescribed with no improvement. She denies diarrhea, fevers, cough and dizziness as associated symptoms. She also has a h/o HTN, ESRD and depression. She denies smoking and alcohol use.  PCP is Dr. Alinda Dooms in [place].  Past Medical History  Diagnosis Date  . Diabetes mellitus   . Osteomyelitis 2009  . Hypertension   . ESRD on hemodialysis   . Gastroparesis   . Depression   . 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 Back surgery   . Cholecystectomy   . Av fistula placement     left arm  . Pacemaker insertion     for gastroparesis  . Colonoscopy 11/06/2011    Procedure: COLONOSCOPY;  Surgeon: Rogene Houston, MD;  Location: [facility] ENDO [facility];  Service: Endoscopy;  Laterality: N/A;    Family History  Problem Relation Age of Onset  . Cancer Mother 10    stomach  . Diabetes Mother   . Stroke Father 61  .  Heart failure Father   . Diabetes Sister   . Hypertension Sister     History  Substance Use Topics  . Smoking status: Never Smoker   . Smokeless tobacco: Not on file  . Alcohol Use: No    No OB history provided.  Review of Systems  Constitutional: Negative for fever.       10 Systems reviewed and are negative for acute change except as noted in the HPI.  HENT: Negative for congestion.   Eyes: Negative for discharge and redness.  Respiratory: Negative for cough and shortness of breath.   Cardiovascular: Negative for chest pain.  Gastrointestinal: Positive for nausea and vomiting. Negative for abdominal pain.  Musculoskeletal: Negative for back pain.  Skin: Negative for rash.  Neurological: Negative for syncope, numbness and headaches.  Psychiatric/Behavioral:       No behavior change.    A complete 10 system review of systems was obtained and all systems are negative except as noted in the HPI and PMH.   Allergies  Dilaudid; Hydrocodone-acetaminophen; Tylenol; Ace inhibitors; Alprazolam; Bee venom; and Zolpidem tartrate  Home Medications   Current Outpatient Rx  Name Route Sig Dispense Refill  . AMLODIPINE BESYLATE 10 MG PO TABS Oral Take 10 mg by mouth daily.    . ASPIRIN EC 81 MG PO TBEC Oral Take 81 mg by mouth every morning.     Marland Kitchen  CALCIUM ACETATE 667 MG PO CAPS Oral Take 667 mg by mouth 3 (three) times daily.      Marland Kitchen CARVEDILOL 25 MG PO TABS Oral Take 25 mg by mouth 2 (two) times daily with a meal.      . CINACALCET HCL 30 MG PO TABS Oral Take 30 mg by mouth every evening.      Marland Kitchen HYDRALAZINE HCL 50 MG PO TABS Oral Take 50 mg by mouth 4 (four) times daily.    . INSULIN GLARGINE 100 UNIT/ML [place] SOLN Subcutaneous Inject 25 Units into the skin at bedtime.      . INSULIN LISPRO (HUMAN) 100 UNIT/ML [place] SOLN Subcutaneous Inject 15 Units into the skin 3 (three) times daily before meals.      Marland Kitchen LISINOPRIL 20 MG PO TABS Oral Take 20 mg by mouth 2 (two) times daily.    Marland Kitchen  OMEPRAZOLE 20 MG PO CPDR Oral Take 20 mg by mouth every morning.     Marland Kitchen PRAVASTATIN SODIUM 20 MG PO TABS Oral Take 20 mg by mouth every morning.     Marland Kitchen PREGABALIN 75 MG PO CAPS Oral Take 75 mg by mouth 2 (two) times daily.      Marland Kitchen PROMETHAZINE HCL 25 MG RE SUPP Rectal Place 1 suppository (25 mg total) rectally every 6 (six) hours as needed for nausea. 12 each 0  . QUETIAPINE FUMARATE ER 150 MG PO TB24 Oral Take 150 mg by mouth at bedtime.      Marland Kitchen SEVELAMER CARBONATE 800 MG PO TABS Oral Take 800 mg by mouth 3 (three) times daily with meals.      . SULFAMETHOXAZOLE-TMP DS 800-160 MG PO TABS Oral Take 1 tablet by mouth daily.      . TRAMADOL HCL 50 MG PO TABS Oral Take 50 mg by mouth daily as needed. For back  pain       Triage Vitals: BP 189/85  Pulse 97  Temp 98.4 F (36.9 C) (Oral)  Resp 18  Ht 5\' 2"  (1.575 m)  Wt 225 lb (102.059 kg)  BMI 41.15 kg/m2  SpO2 99%  Physical Exam  Nursing note and vitals reviewed. Constitutional: She is oriented to person, place, and time. She appears well-developed and well-nourished. No distress.  HENT:  Head: Normocephalic and atraumatic.  Eyes: Conjunctivae normal and EOM are normal.  Neck: Neck supple. No tracheal deviation present.  Cardiovascular: Normal rate and regular rhythm.  Exam reveals no gallop and no friction rub.   No murmur heard. Pulmonary/Chest: Effort normal and breath sounds normal. No respiratory distress.  Abdominal: Soft. Bowel sounds are normal. There is no tenderness. There is no rebound and no guarding.  Musculoskeletal: Normal range of motion. She exhibits no edema.       AV fistula to left arm with good bruit and thrill  Neurological: She is alert and oriented to person, place, and time.  Skin: Skin is warm and dry.  Psychiatric: She has a normal mood and affect. Her behavior is normal.    ED Course  Procedures (including critical care time)  DIAGNOSTIC STUDIES: Oxygen Saturation is 99% on room air, normal by my  interpretation.    COORDINATION OF CARE: 11:16PM- Patient informed of current plan for treatment and evaluation and agrees with plan at this time.   Results for orders placed during the hospital encounter of 04/08/12  CBC WITH DIFFERENTIAL      Component Value Range   WBC 12.1 (*) 4.0 - 10.5 K/uL  RBC 4.43  3.87 - 5.11 MIL/uL   Hemoglobin 13.1  12.0 - 15.0 g/dL   HCT 39.4  36.0 - 46.0 %   MCV 88.9  78.0 - 100.0 fL   MCH 29.6  26.0 - 34.0 pg   MCHC 33.2  30.0 - 36.0 g/dL   RDW 14.6  11.5 - 15.5 %   Platelets 233  150 - 400 K/uL   Neutrophils Relative 87 (*) 43 - 77 %   Neutro Abs 10.6 (*) 1.7 - 7.7 K/uL   Lymphocytes Relative 9 (*) 12 - 46 %   Lymphs Abs 1.1  0.7 - 4.0 K/uL   Monocytes Relative 4  3 - 12 %   Monocytes Absolute 0.4  0.1 - 1.0 K/uL   Eosinophils Relative 0  0 - 5 %   Eosinophils Absolute 0.0  0.0 - 0.7 K/uL   Basophils Relative 0  0 - 1 %   Basophils Absolute 0.0  0.0 - 0.1 K/uL  COMPREHENSIVE METABOLIC PANEL      Component Value Range   Sodium 138  135 - 145 mEq/L   Potassium 3.3 (*) 3.5 - 5.1 mEq/L   Chloride 94 (*) 96 - 112 mEq/L   CO2 26  19 - 32 mEq/L   Glucose, Bld 202 (*) 70 - 99 mg/dL   BUN 31 (*) 6 - 23 mg/dL   Creatinine, Ser 7.55 (*) 0.50 - 1.10 mg/dL   Calcium 10.5  8.4 - 10.5 mg/dL   Total Protein 8.3  6.0 - 8.3 g/dL   Albumin 4.0  3.5 - 5.2 g/dL   AST 28  0 - 37 U/L   ALT 14  0 - 35 U/L   Alkaline Phosphatase 111  39 - 117 U/L   Total Bilirubin 0.5  0.3 - 1.2 mg/dL   GFR calc non Af Amer 6 (*) >90 mL/min   GFR calc Af Amer 7 (*) >90 mL/min  0020 Nurses unable to find IV access. To give zofran PO and morphine IM.  0035 Called to the room for "shaking". Patient has a h/o pseudoseizures that usually occur in the setting of illness. While patient is sitting up and able to answer questions with simple answers arms and truck are quivering. Ordered ativan. [id number] Patient has taken sips of water.  0230 Continues to be able to sip water. No vomiting.    0310 Patient has had vomiting x 1. Given phenergan. 0330 Feels better. Able to eat snack. Sipping water. 0430 Sleeping. No further vomiting. 0500 Patient feels she can be discharged home. Asked for more medicine for pain and nausea. She is scheduled for dialysis this morning.  MDM  Patient with ESRD, DM, HTN , gastro paresis here with vomiting. Given antiemetics x 3, analgesic x 2. Slowly patient has improved with observation. She has taken sips of water and eaten a snack. Labs reflect her ESRD, DM. Her potassium is slightly low however I will not given supplements while she is experiencing ongoing vomiting. Dx testing d/w pt and husband.  Questions answered.  Verb understanding, agreeable to d/c home with outpt f/u. Pt feels improved after observation and/or treatment in ED.Pt stable in ED with no significant deterioration in condition.The patient appears reasonably screened and/or stabilized for discharge and I doubt any other medical condition or other [facility] requiring further screening, evaluation, or treatment in the ED at this time prior to discharge.  I personally performed the services described in this documentation, which was scribed in  my presence. The recorded information has been reviewed and considered.   MDM Reviewed: previous chart, nursing note and vitals Reviewed previous: labs Interpretation: labs Total time providing critical care: 40.             Gypsy Balsam. [name], MD 04/09/12 [phone]

## 2012-04-09 LAB — CBC WITH DIFFERENTIAL/PLATELET
Basophils Relative: 0 % (ref 0–1)
Eosinophils Absolute: 0 10*3/uL (ref 0.0–0.7)
HCT: 39.4 % (ref 36.0–46.0)
Hemoglobin: 13.1 g/dL (ref 12.0–15.0)
Lymphs Abs: 1.1 10*3/uL (ref 0.7–4.0)
MCH: 29.6 pg (ref 26.0–34.0)
MCHC: 33.2 g/dL (ref 30.0–36.0)
Monocytes Absolute: 0.4 10*3/uL (ref 0.1–1.0)
Monocytes Relative: 4 % (ref 3–12)
Neutro Abs: 10.6 10*3/uL — ABNORMAL HIGH (ref 1.7–7.7)
RBC: 4.43 MIL/uL (ref 3.87–5.11)

## 2012-04-09 LAB — COMPREHENSIVE METABOLIC PANEL
AST: 28 U/L (ref 0–37)
Albumin: 4 g/dL (ref 3.5–5.2)
Alkaline Phosphatase: 111 U/L (ref 39–117)
BUN: 31 mg/dL — ABNORMAL HIGH (ref 6–23)
CO2: 26 mEq/L (ref 19–32)
Chloride: 94 mEq/L — ABNORMAL LOW (ref 96–112)
Creatinine, Ser: 7.55 mg/dL — ABNORMAL HIGH (ref 0.50–1.10)
GFR calc Af Amer: 7 mL/min — ABNORMAL LOW (ref >90)
GFR calc non Af Amer: 6 mL/min — ABNORMAL LOW (ref >90)
Glucose, Bld: 202 mg/dL — ABNORMAL HIGH (ref 70–99)
Sodium: 138 mEq/L (ref 135–145)
Total Bilirubin: 0.5 mg/dL (ref 0.3–1.2)

## 2012-04-09 MED ORDER — ONDANSETRON 8 MG PO TBDP
8.0000 mg | ORAL_TABLET | Freq: Once | ORAL | Status: AC
  Administered 2012-04-09: 8 mg via ORAL
  Filled 2012-04-09: qty 1

## 2012-04-09 MED ORDER — LORAZEPAM 2 MG/ML IJ SOLN
1.0000 mg | Freq: Once | INTRAMUSCULAR | Status: AC
  Administered 2012-04-09: 1 mg via INTRAMUSCULAR
  Filled 2012-04-09: qty 1

## 2012-04-09 MED ORDER — PROMETHAZINE HCL 25 MG/ML IJ SOLN
25.0000 mg | Freq: Once | INTRAMUSCULAR | Status: AC
  Administered 2012-04-09: 25 mg via INTRAMUSCULAR
  Filled 2012-04-09: qty 1

## 2012-04-09 MED ORDER — MORPHINE SULFATE 2 MG/ML IJ SOLN
2.0000 mg | Freq: Once | INTRAMUSCULAR | Status: AC
  Administered 2012-04-09: 2 mg via INTRAMUSCULAR
  Filled 2012-04-09: qty 1

## 2012-04-09 MED ORDER — MORPHINE SULFATE 4 MG/ML IJ SOLN
4.0000 mg | Freq: Once | INTRAMUSCULAR | Status: AC
  Administered 2012-04-09: 4 mg via INTRAMUSCULAR
  Filled 2012-04-09: qty 1

## 2012-04-09 MED ORDER — PANTOPRAZOLE SODIUM 40 MG PO TBEC
40.0000 mg | DELAYED_RELEASE_TABLET | Freq: Once | ORAL | Status: AC
  Administered 2012-04-09: 40 mg via ORAL
  Filled 2012-04-09: qty 1

## 2012-04-09 NOTE — ED Notes (Signed)
Went into patient's room to administer zofran ODT and patient was sitting up in bed. Patient started shaking all over. Spouse stated "the doctor in [place] said she has pseudoseizures. It only happens when she gets sick like this." Summoned MD and charge nurse and at bedside. Verbal order for Ativan 1mg  IM by Dr [name]. Medication given. Patient lying in bed sleeping at this time.

## 2012-04-09 NOTE — ED Notes (Signed)
Patient's spouse states patient does not make urine. States "if she goes, it is a very little bit."

## 2012-04-09 NOTE — ED Notes (Signed)
Gave patient ice water to drink as ordered per MD.

## 2012-04-09 NOTE — ED Notes (Signed)
Gave family member graham crackers as requested.

## 2012-04-09 NOTE — ED Notes (Signed)
Attempted IV x 7 unsuccessful by 5 different nurses. Spouse states he does not want patient stuck anymore. Advised MD.

## 2012-04-09 NOTE — ED Notes (Signed)
Patient drinking water with no further episodes of vomiting.

## 2012-04-09 NOTE — ED Notes (Signed)
Patient vomiting at this time. Advised Dr [name].

## 2012-05-20 NOTE — Progress Notes (Signed)
05/20/2012 11:12 AM      Subjective:     Karen Fuller is seen in office today for follow up visit. Was told by physician in [place] that she has heart murmur. She denies chest pain, chest pressure/discomfort, dyspnea, palpitations, irregular heart beats, near-syncope, syncope, fatigue, orthopnea, paroxysmal nocturnal dyspnea, exertional chest pressure/discomfort.    BP 110/60   Pulse 88   Resp 20   Ht 5\' 2"  (1.575 m)   Wt 251 lb 8 oz (114.08 kg)   BMI 45.99 kg/m2  Current Outpatient Prescriptions   Medication Sig   ??? cinacalcet (SENSIPAR) 30 mg tablet Take 30 mg by mouth daily.   ??? lisinopril (PRINIVIL, ZESTRIL) 20 mg tablet Take  by mouth daily.   ??? trimethoprim-sulfamethoxazole (BACTRIM DS, SEPTRA DS) 160-800 mg per tablet Take 1 Tab by mouth two (2) times a day.   ??? traMADol (ULTRAM) 50 mg tablet Take 50 mg by mouth every six (6) hours as needed for Pain.   ??? hydrALAZINE (APRESOLINE) 50 mg tablet Take 25 mg by mouth four (4) times daily.   ??? sevelamer carbonate (RENVELA) 800 mg tab tab Take  by mouth three (3) times daily.   ??? pravastatin (PRAVACHOL) 20 mg tablet Take 20 mg by mouth daily.     ??? omeprazole (PRILOSEC) 20 mg capsule Take 20 mg by mouth daily.   ??? insulin lispro (HUMALOG) 100 unit/mL injection 15 Units by SubCUTAneous route once. With Each Meal    ??? aspirin 81 mg chewable tablet Take 81 mg by mouth daily.   ??? calcium acetate (PHOSLO) 667 mg Cap Take  by mouth three (3) times daily (with meals).   ??? pregabalin (LYRICA) 75 mg capsule Take 75 mg by mouth two (2) times a day.   ??? quetiapine (SEROQUEL) 50 mg tablet Take 150 mg by mouth daily.   ??? amlodipine (NORVASC) 10 mg tablet Take  by mouth daily.   ??? carvedilol (COREG) 25 mg tablet Take 25 mg by mouth two (2) times daily (with meals).   ??? insulin glargine (LANTUS) 100 unit/mL injection 25 Units by SubCUTAneous route. At Bedtime     ??? sucralfate (CARAFATE) 1 gram tablet Take 1 g by mouth four (4) times daily.     No  current facility-administered medications for this visit.         Objective:      Visit Vitals   Item Reading   ??? BP 110/60   ??? Pulse 88   ??? Resp 20   ??? Ht 5\' 2"  (1.575 m)   ??? Wt 251 lb 8 oz (114.08 kg)   ??? BMI 45.99 kg/m2       Data Review:   Labs:  No results found for this or any previous visit (from the past 24 hour(s)).    EKG: Normal sinus rhythm, non specific t wave changes, LAD    Reviewed and/or ordered active problem list, medication list tests    Past Medical History   Diagnosis Date   ??? Reflux 01/12/2009   ??? DM (diabetes mellitus) 01/12/2009   ??? HTN (hypertension) 01/12/2009   ??? Chronic kidney failure 01/12/2009   ??? Abdominal pain 01/12/2009   ??? Nausea & vomiting 01/12/2009   ??? Gastroparesis 01/12/2009   ??? GERD (gastroesophageal reflux disease)    ??? Renal disease    ??? Anemia NEC    ??? Musculoskeletal disorder    ??? Depression    ??? Calculus of kidney  Past Surgical History   Procedure Laterality Date   ??? Hx cesarean section       x2   ??? Hx cholecystectomy       Allergies   Allergen Reactions   ??? Lortab (Hydrocodone-Acetaminophen) Other (comments)     Stopped Breathing     ??? Tylenol (Acetaminophen) Other (comments)     Stopped Breathing   ??? Ambien (Zolpidem) Other (comments)     Hallucinations   ??? Hydromorphone Shortness of Breath      Family History   Problem Relation Age of Onset   ??? Cancer Mother    ??? Diabetes Mother    ??? Hypertension Mother    ??? Stroke Father       History     Social History   ??? Marital Status: MARRIED     Spouse Name: N/A     Number of Children: N/A   ??? Years of Education: N/A     Occupational History   ??? Not on file.     Social History Main Topics   ??? Smoking status: Never Smoker    ??? Smokeless tobacco: Not on file   ??? Alcohol Use: No   ??? Drug Use: No   ??? Sexually Active: Not on file     Other Topics Concern   ??? Not on file     Social History Narrative   ??? No narrative on file       Review of Systems??    General:??Not Present- Anorexia, Chills, Dietary Changes, Fatigue, Fever, Medication  Changes, Night Sweats, Weight Gain > 10lbs. and Weight Loss > 10lbs..  Skin:??Not Present- Bruising and Excessive Sweating.  HEENT:??Not Present- Headache, Visual Loss and Vertigo.  Respiratory:??Not Present- Cough, Decreased Exercise Tolerance, Difficulty Breathing, Snoring and Wheezing.  Cardiovascular:??Not Present- Abnormal Blood Pressure, Chest Pain, Claudications, Difficulty Breathing On Exertion, Edema, Fainting / Blacking Out, Irregular Heart Beat, Night Cramps, Orthopnea, Palpitations, Paroxysmal Nocturnal Dyspnea, Rapid Heart Rate, Shortness of Breath and Swelling of Extremities.  Gastrointestinal:??Not Present- Black, Tarry Stool, Bloody Stool, Diarrhea, Hematemesis, Rectal Bleeding and Vomiting.  Musculoskeletal:??Not Present- Muscle Pain and Muscle Weakness.  Neurological:??Not Present- Dizziness.  Psychiatric:??Not Present- Depression.  Endocrine:??Not Present- Cold Intolerance, Heat Intolerance and Thyroid Problems.  Hematology:??Not Present- Abnormal Bleeding, Anemia, Blood Clots and Easy Bruising.    ??  Physical Exam??  The physical exam findings are as follows:    ??  General??  Mental Status??- Alert. General Appearance??- Not in acute distress.      Chest and Lung Exam??  Inspection:??Accessory muscles??- No use of accessory muscles in breathing.  Auscultation:??  Breath sounds:??- Normal.      Cardiovascular??  Inspection:??Jugular vein??- Bilateral??- Inspection Normal.  Palpation/Percussion:??  Apical Impulse:??- Normal.  Auscultation:??Rhythm??- Regular. Heart Sounds??- S1 WNL and S2 WNL. No S3 or S4.  Murmurs & Other Heart Sounds:??Auscultation of the heart reveals - soft 2/6 systolic murmur over LSB.  Carotid arteries??- No Carotid bruit.      Peripheral Vascular??  Upper Extremity:??Inspection??- Bilateral??- No Cyanotic nailbeds or Digital clubbing.  Lower Extremity:??  Palpation:??Dorsalis pedis pulse??- Bilateral??- Normal. Posterior tibia pulse - Bilateral??- Normal. Edema??- Bilateral??- No edema.      Assessment:     1. HTN  (hypertension)  AMB POC EKG ROUTINE W/ 12 LEADS, INTER & REP, 2D ECHO COMPLETE ADULT (TTE)   2. DM (diabetes mellitus)  2D ECHO COMPLETE ADULT (TTE)   3. Chronic kidney failure  2D ECHO COMPLETE ADULT (TTE)   4. Systolic  murmur  2D ECHO COMPLETE ADULT (TTE)       Plan:     Systolic Murmu: check 2D Echo. Normal Echo in 02/2009.     BENIGN ESSENTIAL HYPERTENSION  Well controlled. Continue current meds.      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     GASTROPARESIS   Impression: S/P GASTRIC PACEMAKER

## 2012-05-20 NOTE — Patient Instructions (Signed)
MyChart Activation    Thank you for requesting access to MyChart. Please follow the instructions below to securely access and download your online medical record. MyChart allows you to send messages to your doctor, view your test results, renew your prescriptions, schedule appointments, and more.    How Do I Sign Up?    1. In your internet browser, go to [email or web address]  2. Click on the First Time User? Click Here link in the Sign In box. You will be redirect to the New Member Sign Up page.  3. Enter your MyChart Access Code exactly as it appears below. You will not need to use this code after you???ve completed the sign-up process. If you do not sign up before the expiration date, you must request a new code.    MyChart Access Code: [email or web address]  Expires: 08/18/2012 10:06 AM (This is the date your MyChart access code will expire)    4. Enter the last four digits of your Social Security Number (xxxx) and Date of Birth (mm/dd/yyyy) as indicated and click Submit. You will be taken to the next sign-up page.  5. Create a MyChart ID. This will be your MyChart login ID and cannot be changed, so think of one that is secure and easy to remember.  6. Create a MyChart password. You can change your password at any time.  7. Enter your Password Reset Question and Answer. This can be used at a later time if you forget your password.   8. Enter your e-mail address. You will receive e-mail notification when new information is available in MyChart.  9. Click Sign Up. You can now view and download portions of your medical record.  10. Click the Download Summary menu link to download a portable copy of your medical information.    Additional Information    If you have questions, please visit the Frequently Asked Questions section of the MyChart website at [email or web address] Remember, MyChart is NOT to be used for urgent needs. For medical emergencies, dial 911.

## 2012-05-20 NOTE — Telephone Encounter (Signed)
Message copied by Catalina Lunger on Tue May 20, 2012  3:47 PM  ------       Message from: Ronnell Freshwater [facility]       Created: Tue May 20, 2012 11:15 AM         Can you get copy of recent blood work including FLP from her PCP in [place], [place].  ------

## 2012-05-20 NOTE — Progress Notes (Signed)
Follow up. C/O SOB on and off. Patient states that she was told she has a heart murmur and was told to get a cardiologist.

## 2012-05-20 NOTE — Telephone Encounter (Signed)
Call MD Celine Mans office spoke with [facility] request fax most recent blood work including FLP.

## 2012-06-16 NOTE — Telephone Encounter (Signed)
Records received.

## 2012-06-16 NOTE — Telephone Encounter (Signed)
Called PCP Christy to fax recent labs.

## 2012-07-03 NOTE — Patient Instructions (Signed)
MyChart Activation    Thank you for requesting access to MyChart. Please follow the instructions below to securely access and download your online medical record. MyChart allows you to send messages to your doctor, view your test results, renew your prescriptions, schedule appointments, and more.    How Do I Sign Up?    1. In your internet browser, go to [email or web address]  2. Click on the First Time User? Click Here link in the Sign In box. You will be redirect to the New Member Sign Up page.  3. Enter your MyChart Access Code exactly as it appears below. You will not need to use this code after you???ve completed the sign-up process. If you do not sign up before the expiration date, you must request a new code.    MyChart Access Code: [email or web address]  Expires: 08/18/2012 10:06 AM (This is the date your MyChart access code will expire)    4. Enter the last four digits of your Social Security Number (xxxx) and Date of Birth (mm/dd/yyyy) as indicated and click Submit. You will be taken to the next sign-up page.  5. Create a MyChart ID. This will be your MyChart login ID and cannot be changed, so think of one that is secure and easy to remember.  6. Create a MyChart password. You can change your password at any time.  7. Enter your Password Reset Question and Answer. This can be used at a later time if you forget your password.   8. Enter your e-mail address. You will receive e-mail notification when new information is available in MyChart.  9. Click Sign Up. You can now view and download portions of your medical record.  10. Click the Download Summary menu link to download a portable copy of your medical information.    Additional Information    If you have questions, please visit the Frequently Asked Questions section of the MyChart website at [email or web address] Remember, MyChart is NOT to be used for urgent needs. For medical emergencies, dial 911.

## 2012-07-03 NOTE — Telephone Encounter (Signed)
Message copied by Catalina Lunger on Thu Jul 03, 2012  2:04 PM  ------       Message from: Ronnell Freshwater [facility]       Created: Thu Jul 03, 2012  1:54 PM         Inform her that echo is ok.   ------

## 2012-07-03 NOTE — Telephone Encounter (Signed)
Called patient left message to return call to inform ECHO is ok.

## 2012-07-07 NOTE — Telephone Encounter (Signed)
Called patient informed ECHO is ok.

## 2012-12-18 NOTE — Patient Instructions (Signed)
MyChart Activation    Thank you for requesting access to MyChart. Please follow the instructions below to securely access and download your online medical record. MyChart allows you to send messages to your doctor, view your test results, renew your prescriptions, schedule appointments, and more.    How Do I Sign Up?    1. In your internet browser, go to [email or web address]  2. Click on the First Time User? Click Here link in the Sign In box. You will be redirect to the New Member Sign Up page.  3. Enter your MyChart Access Code exactly as it appears below. You will not need to use this code after you???ve completed the sign-up process. If you do not sign up before the expiration date, you must request a new code.    MyChart Access Code: [email or web address]  Expires: 03/18/2013 11:27 AM (This is the date your MyChart access code will expire)    4. Enter the last four digits of your Social Security Number (xxxx) and Date of Birth (mm/dd/yyyy) as indicated and click Submit. You will be taken to the next sign-up page.  5. Create a MyChart ID. This will be your MyChart login ID and cannot be changed, so think of one that is secure and easy to remember.  6. Create a MyChart password. You can change your password at any time.  7. Enter your Password Reset Question and Answer. This can be used at a later time if you forget your password.   8. Enter your e-mail address. You will receive e-mail notification when new information is available in MyChart.  9. Click Sign Up. You can now view and download portions of your medical record.  10. Click the Download Summary menu link to download a portable copy of your medical information.    Additional Information    If you have questions, please visit the Frequently Asked Questions section of the MyChart website at [email or web address] Remember, MyChart is NOT to be used for urgent needs. For medical emergencies, dial 911.

## 2012-12-18 NOTE — Progress Notes (Signed)
Subjective/HPI:     Karen Fuller is a 52 y.o. female is here for f/u appt.  The patient denies chest pain/ shortness of breath, orthopnea, PND, LE edema, palpitations, syncope, presyncope or fatigue.         PCP Provider  Karen Broad, MD  Past Medical History   Diagnosis Date   ??? Reflux 01/12/2009   ??? DM (diabetes mellitus) 01/12/2009   ??? HTN (hypertension) 01/12/2009   ??? Chronic kidney failure 01/12/2009   ??? Abdominal pain 01/12/2009   ??? Nausea & vomiting 01/12/2009   ??? Gastroparesis 01/12/2009   ??? GERD (gastroesophageal reflux disease)    ??? Renal disease    ??? Anemia NEC    ??? Musculoskeletal disorder    ??? Depression    ??? Calculus of kidney       Past Surgical History   Procedure Laterality Date   ??? Hx cesarean section       x2   ??? Hx cholecystectomy       Allergies   Allergen Reactions   ??? Lortab (Hydrocodone-Acetaminophen) Other (comments)     Stopped Breathing     ??? Tylenol (Acetaminophen) Other (comments)     Stopped Breathing   ??? Ambien (Zolpidem) Other (comments)     Hallucinations   ??? Hydromorphone Shortness of Breath      Family History   Problem Relation Age of Onset   ??? Cancer Mother    ??? Diabetes Mother    ??? Hypertension Mother    ??? Stroke Father       Current Outpatient Prescriptions   Medication Sig   ??? cinacalcet (SENSIPAR) 30 mg tablet Take 30 mg by mouth daily.   ??? lisinopril (PRINIVIL, ZESTRIL) 20 mg tablet Take  by mouth daily.   ??? trimethoprim-sulfamethoxazole (BACTRIM DS, SEPTRA DS) 160-800 mg per tablet Take 1 Tab by mouth two (2) times a day.   ??? traMADol (ULTRAM) 50 mg tablet Take 50 mg by mouth every six (6) hours as needed for Pain.   ??? hydrALAZINE (APRESOLINE) 50 mg tablet Take 25 mg by mouth four (4) times daily.   ??? sevelamer carbonate (RENVELA) 800 mg tab tab Take  by mouth three (3) times daily.   ??? pravastatin (PRAVACHOL) 20 mg tablet Take 20 mg by mouth daily.     ??? omeprazole (PRILOSEC) 20 mg capsule Take 20 mg by mouth daily.   ??? insulin lispro (HUMALOG) 100  unit/mL injection 15 Units by SubCUTAneous route once. With Each Meal    ??? aspirin 81 mg chewable tablet Take 81 mg by mouth daily.   ??? calcium acetate (PHOSLO) 667 mg Cap Take  by mouth three (3) times daily (with meals).   ??? sucralfate (CARAFATE) 1 gram tablet Take 1 g by mouth four (4) times daily.   ??? pregabalin (LYRICA) 75 mg capsule Take 75 mg by mouth two (2) times a day.   ??? quetiapine (SEROQUEL) 50 mg tablet Take 150 mg by mouth daily.   ??? amlodipine (NORVASC) 10 mg tablet Take  by mouth daily.   ??? carvedilol (COREG) 25 mg tablet Take 25 mg by mouth two (2) times daily (with meals).   ??? insulin glargine (LANTUS) 100 unit/mL injection 25 Units by SubCUTAneous route. At Bedtime       No current facility-administered medications for this visit.      Filed Vitals:    12/18/12 1149   BP: 130/60   Pulse: 90  Resp: 16   Height: 5\' 2"  (1.575 m)   Weight: 250 lb 3.2 oz (113.49 kg)     History     Social History   ??? Marital Status: MARRIED     Spouse Name: N/A     Number of Children: N/A   ??? Years of Education: N/A     Occupational History   ??? Not on file.     Social History Main Topics   ??? Smoking status: Never Smoker    ??? Smokeless tobacco: Not on file   ??? Alcohol Use: No   ??? Drug Use: No   ??? Sexually Active: Not on file     Other Topics Concern   ??? Not on file     Social History Narrative   ??? No narrative on file       I have reviewed the nurses notes, vitals, problem list, allergy list, medical history, family, social history and medications.    Review of Symptoms:    General: Pt denies excessive weight gain or loss. Pt is able to conduct ADL's  HEENT: Denies blurred vision, headaches, epistaxis and difficulty swallowing.  Respiratory: Denies shortness of breath, DOE, wheezing or stridor.  Cardiovascular: Denies precordial pain, palpitations, edema or PND  Gastrointestinal: Denies poor appetite, indigestion, abdominal pain or blood in stool  Musculoskeletal: Denies pain or swelling from muscles or joints   Neurologic: Denies tremor, paresthesias, or sensory motor disturbance  Skin: Denies rash, itching or texture change.      Physical Exam: ??    General: Well developed, in no acute distress, cooperative and alert  HEENT: No carotid bruits, no JVD, trach is midline. Neck Supple, PEERL, EOM intact.  Heart: ??Normal S1/S2 negative S3 or S4. Regular, no murmur, gallop or rub.??  Respiratory: Clear bilaterally x 4, no wheezing or rales  Abdomen:?? ??Soft, non-tender, no masses, bowel sounds are active.??  Extremities:  No edema, normal cap refill, no cyanosis, atraumatic.   Neuro: A&Ox3, speech clear, gait stable.   Skin: Skin color is normal. No rashes or lesions. Non diaphoretic  Vascular: 2+ pulses symmetric in all extremities    Cardiographics    ECG: sinus rhythm.    No results found for this or any previous visit.      Cardiology Labs:  Lab Results   Component Value Date/Time    Cholesterol, total 192 06/27/2010 12:00 AM    HDL Cholesterol 34 06/27/2010 12:00 AM    LDL, calculated 116 06/27/2010 12:00 AM    Triglyceride 209 06/27/2010 12:00 AM       Lab Results   Component Value Date/Time    Sodium 139 06/27/2010 12:00 AM    Potassium 4.2 06/27/2010 12:00 AM    Chloride 93 06/27/2010 12:00 AM    CO2 26 06/27/2010 12:00 AM    Anion gap 12 04/30/2009  4:20 AM    Glucose 252 06/27/2010 12:00 AM    BUN 25 06/27/2010 12:00 AM    Creatinine 5.81 06/27/2010 12:00 AM    BUN/Creatinine ratio 4 06/27/2010 12:00 AM    GFR est AA 9 06/27/2010 12:00 AM    GFR est non-AA 8 06/27/2010 12:00 AM    Calcium 9.2 06/27/2010 12:00 AM    AST 13 06/27/2010 12:00 AM    Alk. phosphatase 174 06/27/2010 12:00 AM    Protein, total 7.9 06/27/2010 12:00 AM    Albumin 4.2 06/27/2010 12:00 AM    Globulin 3.8 04/29/2009  4:53 AM  A-G Ratio 1.1 06/27/2010 12:00 AM    ALT 7 06/27/2010 12:00 AM           Assessment:     Assessment:     Karen Fuller was seen today for hypertension.    Diagnoses and associated orders for this visit:    HTN (hypertension)  - AMB POC EKG ROUTINE W/  12 LEADS, INTER & REP    DM (diabetes mellitus)          ICD-9-CM    1. HTN (hypertension) 401.9 AMB POC EKG ROUTINE W/ 12 LEADS, INTER & REP   2. DM (diabetes mellitus) 250.00      Orders Placed This Encounter   ??? AMB POC EKG ROUTINE W/ 12 LEADS, INTER & REP     Order Specific Question:  Reason for Exam:     Answer:  401.9        Plan:     BENIGN ESSENTIAL HYPERTENSION   Well controlled. Continue current meds.     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     GASTROPARESIS- S/P GASTRIC PACEMAKER    Elwanda Brooklyn, NP    Pt seen and examined in details. See NP A&P for details. Stable from cardiac standpoint. Recently went to ER at [facility] for SOB and cough symptoms. With abx symptoms has improved. BNP essentially normal at that time.     Marrian Salvage, MD

## 2012-12-18 NOTE — Progress Notes (Signed)
6 month follow up, doing ok

## 2013-03-27 ENCOUNTER — Emergency Department (HOSPITAL_COMMUNITY): Payer: Medicare Other

## 2013-03-27 ENCOUNTER — Emergency Department (HOSPITAL_COMMUNITY)
Admission: EM | Admit: 2013-03-27 | Discharge: 2013-03-27 | Disposition: A | Discharge status: Home / Self Care | Payer: Medicare Other | Attending: Emergency Medicine | Admitting: Emergency Medicine

## 2013-03-27 DIAGNOSIS — Z79899 Other long term (current) drug therapy: Secondary | ICD-10-CM | POA: Insufficient documentation

## 2013-03-27 DIAGNOSIS — K3184 Gastroparesis: Secondary | ICD-10-CM | POA: Insufficient documentation

## 2013-03-27 DIAGNOSIS — Z8739 Personal history of other diseases of the musculoskeletal system and connective tissue: Secondary | ICD-10-CM | POA: Insufficient documentation

## 2013-03-27 DIAGNOSIS — Z7982 Long term (current) use of aspirin: Secondary | ICD-10-CM | POA: Insufficient documentation

## 2013-03-27 DIAGNOSIS — F3289 Other specified depressive episodes: Secondary | ICD-10-CM | POA: Insufficient documentation

## 2013-03-27 DIAGNOSIS — Z9889 Other specified postprocedural states: Secondary | ICD-10-CM | POA: Insufficient documentation

## 2013-03-27 DIAGNOSIS — N201 Calculus of ureter: Secondary | ICD-10-CM

## 2013-03-27 DIAGNOSIS — Z794 Long term (current) use of insulin: Secondary | ICD-10-CM | POA: Insufficient documentation

## 2013-03-27 DIAGNOSIS — Z992 Dependence on renal dialysis: Secondary | ICD-10-CM | POA: Insufficient documentation

## 2013-03-27 DIAGNOSIS — F329 Major depressive disorder, single episode, unspecified: Secondary | ICD-10-CM | POA: Insufficient documentation

## 2013-03-27 DIAGNOSIS — I120 Hypertensive chronic kidney disease with stage 5 chronic kidney disease or end stage renal disease: Secondary | ICD-10-CM | POA: Insufficient documentation

## 2013-03-27 DIAGNOSIS — E119 Type 2 diabetes mellitus without complications: Secondary | ICD-10-CM | POA: Insufficient documentation

## 2013-03-27 DIAGNOSIS — N186 End stage renal disease: Secondary | ICD-10-CM | POA: Insufficient documentation

## 2013-03-27 DIAGNOSIS — R112 Nausea with vomiting, unspecified: Secondary | ICD-10-CM

## 2013-03-27 LAB — CBC WITH DIFFERENTIAL/PLATELET
Basophils Absolute: 0 10*3/uL (ref 0.0–0.1)
Lymphocytes Relative: 18 % (ref 12–46)
Lymphs Abs: 2 10*3/uL (ref 0.7–4.0)
MCV: 92.4 fL (ref 78.0–100.0)
Neutro Abs: 8.3 10*3/uL — ABNORMAL HIGH (ref 1.7–7.7)
Neutrophils Relative %: 74 % (ref 43–77)
Platelets: 237 10*3/uL (ref 150–400)
RBC: 3.31 MIL/uL — ABNORMAL LOW (ref 3.87–5.11)
RDW: 13 % (ref 11.5–15.5)
WBC: 11.2 10*3/uL — ABNORMAL HIGH (ref 4.0–10.5)

## 2013-03-27 LAB — COMPREHENSIVE METABOLIC PANEL
ALT: 10 U/L (ref 0–35)
AST: 12 U/L (ref 0–37)
Alkaline Phosphatase: 149 U/L — ABNORMAL HIGH (ref 39–117)
CO2: 29 mEq/L (ref 19–32)
Chloride: 90 mEq/L — ABNORMAL LOW (ref 96–112)
GFR calc Af Amer: 6 mL/min — ABNORMAL LOW (ref >90)
GFR calc non Af Amer: 5 mL/min — ABNORMAL LOW (ref >90)
Glucose, Bld: 283 mg/dL — ABNORMAL HIGH (ref 70–99)
Potassium: 3.3 mEq/L — ABNORMAL LOW (ref 3.5–5.1)
Sodium: 133 mEq/L — ABNORMAL LOW (ref 135–145)

## 2013-03-27 MED ORDER — ONDANSETRON HCL 4 MG PO TABS: 4.0000 mg | ORAL_TABLET | Freq: Four times a day (QID) | ORAL | Status: DC

## 2013-03-27 MED ORDER — ONDANSETRON HCL 4 MG/2ML IJ SOLN
4.0000 mg | Freq: Once | INTRAMUSCULAR | Status: AC
  Administered 2013-03-27: 4 mg via INTRAVENOUS
  Filled 2013-03-27: qty 2

## 2013-03-27 MED ORDER — IOHEXOL 300 MG/ML  SOLN
50.0000 mL | Freq: Once | INTRAMUSCULAR | Status: AC | PRN
  Administered 2013-03-27: 50 mL via ORAL

## 2013-03-27 MED ORDER — SODIUM CHLORIDE 0.9 % IJ SOLN
INTRAMUSCULAR | Status: AC
  Filled 2013-03-27: qty 250

## 2013-03-27 MED ORDER — SODIUM CHLORIDE 0.9 % IV BOLUS (SEPSIS)
500.0000 mL | Freq: Once | INTRAVENOUS | Status: AC
  Administered 2013-03-27: 500 mL via INTRAVENOUS

## 2013-03-27 MED ORDER — PROMETHAZINE HCL 25 MG RE SUPP: 25.0000 mg | Freq: Four times a day (QID) | RECTAL | Status: DC | PRN

## 2013-03-27 MED ORDER — MORPHINE SULFATE 4 MG/ML IJ SOLN
4.0000 mg | Freq: Once | INTRAMUSCULAR | Status: AC
  Administered 2013-03-27: 4 mg via INTRAVENOUS
  Filled 2013-03-27: qty 1

## 2013-03-27 MED ORDER — METOCLOPRAMIDE HCL 5 MG/ML IJ SOLN
10.0000 mg | Freq: Once | INTRAMUSCULAR | Status: AC
  Administered 2013-03-27: 10 mg via INTRAVENOUS
  Filled 2013-03-27: qty 2

## 2013-03-27 MED ORDER — IOHEXOL 300 MG/ML  SOLN
100.0000 mL | Freq: Once | INTRAMUSCULAR | Status: AC | PRN
  Administered 2013-03-27: 100 mL via INTRAVENOUS

## 2013-03-27 NOTE — ED Notes (Addendum)
RT and Lab at the bedside to get blood for labs, arterial blood stick per MD

## 2013-03-27 NOTE — ED Provider Notes (Signed)
CSN: [id number]     Arrival date & time 03/27/13  0025 History   First MD Initiated Contact with Patient 03/27/13 0129     Chief Complaint  Patient presents with  . Emesis   (Consider location/radiation/quality/duration/timing/severity/associated sxs/prior Treatment) HPI Comments: Patient with history of gastroparesis presenting with abdominal pain, nausea, vomiting onset around 4 PM. She states she's had 10 episodes of nonbloody nonbilious emesis since then with upper abdominal pain. She denies any diarrhea or fever. This is similar to previous gastroparesis exacerbations. She had a gastric pacemaker in place. Her last dialysis was yesterday. She denies any chest pain or shortness of breath. She says her sugars have been well controlled. She makes very little urine.  The history is provided by the patient.    Past Medical History  Diagnosis Date  . Diabetes mellitus   . Osteomyelitis 2009  . Hypertension   . ESRD on hemodialysis   . Gastroparesis   . Depression   . 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 Back surgery    . Cholecystectomy    . Av fistula placement      left arm  . Pacemaker insertion      for gastroparesis  . Colonoscopy  11/06/2011    Procedure: COLONOSCOPY;  Surgeon: Rogene Houston, MD;  Location: [facility];  Service: Endoscopy;  Laterality: N/A;   Family History  Problem Relation Age of Onset  . Cancer Mother 35    stomach  . Diabetes Mother   . Stroke Father 73  . Heart failure Father   . Diabetes Sister   . Hypertension Sister    History  Substance Use Topics  . Smoking status: Never Smoker   . Smokeless tobacco: Not on file  . Alcohol Use: No   OB History   Grav Para Term Preterm Abortions TAB SAB Ect Mult Living                 Review of Systems  Constitutional: Positive for activity change. Negative for fever and fatigue.  HENT: Negative for rhinorrhea.   Respiratory: Negative for  cough, chest tightness and shortness of breath.   Cardiovascular: Negative for chest pain.  Gastrointestinal: Positive for nausea, vomiting and abdominal pain.  Genitourinary: Negative for dysuria and hematuria.  Musculoskeletal: Negative for back pain.  Skin: Negative for rash.  Neurological: Negative for dizziness and headaches.  A complete 10 system review of systems was obtained and all systems are negative except as noted in the HPI and PMH.    Allergies  Dilaudid; Hydrocodone-acetaminophen; Tylenol; Ace inhibitors; Alprazolam; Bee venom; and Zolpidem tartrate  Home Medications   Current Outpatient Rx  Name  Route  Sig  Dispense  Refill  . amLODipine (NORVASC) 10 MG tablet   Oral   Take 10 mg by mouth daily.         Marland Kitchen aspirin EC 81 MG tablet   Oral   Take 81 mg by mouth every morning.          . calcium acetate (PHOSLO) 667 MG capsule   Oral   Take 667 mg by mouth 3 (three) times daily.           . carvedilol (COREG) 25 MG tablet   Oral   Take 25 mg by mouth 2 (two) times daily with a meal.           . cinacalcet (SENSIPAR) 30 MG tablet  Oral   Take 30 mg by mouth every evening.           . hydrALAZINE (APRESOLINE) 50 MG tablet   Oral   Take 50 mg by mouth 4 (four) times daily.         . insulin glargine (LANTUS) 100 UNIT/ML injection   Subcutaneous   Inject 25 Units into the skin at bedtime.           . insulin lispro (HUMALOG) 100 UNIT/ML injection   Subcutaneous   Inject 15 Units into the skin 3 (three) times daily before meals.           Marland Kitchen lisinopril (PRINIVIL,ZESTRIL) 20 MG tablet   Oral   Take 20 mg by mouth 2 (two) times daily.         Marland Kitchen omeprazole (PRILOSEC) 20 MG capsule   Oral   Take 20 mg by mouth every morning.          . pravastatin (PRAVACHOL) 20 MG tablet   Oral   Take 20 mg by mouth every morning.          . pregabalin (LYRICA) 75 MG capsule   Oral   Take 75 mg by mouth 2 (two) times daily.           .  QUEtiapine Fumarate (SEROQUEL XR) 150 MG 24 hr tablet   Oral   Take 150 mg by mouth at bedtime.           . sevelamer (RENVELA) 800 MG tablet   Oral   Take 800 mg by mouth 3 (three) times daily with meals.           Marland Kitchen sulfamethoxazole-trimethoprim (BACTRIM DS) 800-160 MG per tablet   Oral   Take 1 tablet by mouth daily.           . traMADol (ULTRAM) 50 MG tablet   Oral   Take 50 mg by mouth daily as needed. For back  pain          . ondansetron (ZOFRAN) 4 MG tablet   Oral   Take 1 tablet (4 mg total) by mouth every 6 (six) hours.   12 tablet   0   . EXPIRED: promethazine (PHENERGAN) 25 MG suppository   Rectal   Place 1 suppository (25 mg total) rectally every 6 (six) hours as needed for nausea.   12 each   0   . promethazine (PHENERGAN) 25 MG suppository   Rectal   Place 1 suppository (25 mg total) rectally every 6 (six) hours as needed for nausea.   12 each   0    BP 142/43  Pulse 90  Temp(Src) 98.2 F (36.8 C) (Oral)  Resp 20  Ht 5\' 2"  (1.575 m)  Wt 240 lb (108.863 kg)  BMI 43.89 kg/m2  SpO2 93% Physical Exam  Constitutional: She is oriented to person, place, and time. She appears well-developed and well-nourished. No distress.  HENT:  Head: Normocephalic and atraumatic.  Mouth/Throat: Oropharynx is clear and moist. No oropharyngeal exudate.  Eyes: Conjunctivae and EOM are normal. Pupils are equal, round, and reactive to light.  Neck: Normal range of motion. Neck supple.  Cardiovascular: Normal rate, regular rhythm and normal heart sounds.   Pulmonary/Chest: Effort normal and breath sounds normal. No respiratory distress.  Abdominal: Soft. There is tenderness. There is no rebound and no guarding.  TTP epigastrum and LUQ wihtout peritoneal signs  Gastric stimulator in LLQ  Musculoskeletal: Normal range of  motion. She exhibits no edema and no tenderness.  L forearm fistula with intact thrill and bruit  Neurological: She is alert and oriented to  person, place, and time. No cranial nerve deficit. She exhibits normal muscle tone. Coordination normal.  Skin: Skin is warm.    ED Course  Procedures (including critical care time) Labs Review Labs Reviewed  CBC WITH DIFFERENTIAL - Abnormal; Notable for the following:    WBC 11.2 (*)    RBC 3.31 (*)    Hemoglobin 10.3 (*)    HCT 30.6 (*)    Neutro Abs 8.3 (*)    All other components within normal limits  COMPREHENSIVE METABOLIC PANEL - Abnormal; Notable for the following:    Sodium 133 (*)    Potassium 3.3 (*)    Chloride 90 (*)    Glucose, Bld 283 (*)    BUN 29 (*)    Creatinine, Ser 8.02 (*)    Alkaline Phosphatase 149 (*)    GFR calc non Af Amer 5 (*)    GFR calc Af Amer 6 (*)    All other components within normal limits  LIPASE, BLOOD  URINALYSIS, ROUTINE W REFLEX MICROSCOPIC  OCCULT BLOOD, POC DEVICE   Imaging Review Ct Abdomen Pelvis W Contrast  03/27/2013   *RADIOLOGY REPORT*  Clinical Data: Emesis.  CT ABDOMEN AND PELVIS WITH CONTRAST  Technique:  Multidetector CT imaging of the abdomen and pelvis was performed following the standard protocol during bolus administration of intravenous contrast.  Contrast: 16mL OMNIPAQUE IOHEXOL 300 MG/ML  SOLN, 165mL OMNIPAQUE IOHEXOL 300 MG/ML  SOLN  Comparison: CT of the abdomen and pelvis December 17, 2011.  Findings: Limited view of the lung bases demonstrates mild vascular engorgement, patchy ground-glass opacities may reflect confluent edema, no pleural effusions.  The heart is mildly enlarged.  Small hiatal hernia.  The stomach, small and large bowel are overall normal in course and caliber without wall thickening or inflammatory changes though, sensitivity is decreased by streak artifact from the colonic electronic stimulator in the left anterior abdominal wall and lumbar spine hardware.  Contrast has yet to reach the large bowel.  Stimulator lead extends into the abdomen, contiguous with the greater curvature of the stomach.No  intraperitoneal free fluid or free air.  Status post cholecystectomy.  The liver, spleen, pancreas and adrenal glands are non-suspicious and unchanged, with 7 mm nodular density arising from the left adrenal gland, stable.  Mild right hydroureteronephrosis level of the mid ureter where a 3 mm calculus is seen.  Mild right perinephric stranding may reflect calyceal rupture.  No residual nephrolithiasis.  No left hydronephrosis.  Urinary bladder is well-distended unremarkable. Internal reproductive organs are not suspicious.  Great vessels are normal course and caliber, with minimal calcific atherosclerosis of the renal artery ostium on the right.  Status post L5 S1 laminectomy with L4 through the sacrum pedicle screws in place and solid bony fusion posterior. Small fat containing umbilical hernia.  IMPRESSION: 3 mm right mid ureteral calculus, mild hydronephrosis; minimal right retroperitoneal free fluid may reflect calyceal rupture.   Original Report Authenticated By: Elon Alas    EKG Interpretation   None       MDM   1. Gastroparesis   2. Nausea and vomiting   3. Ureterolithiasis    Nausea, vomiting, abdominal pain with history of gastroparesis. No fever or diarrhea. Abdomen with mild tenderness in the upper quadrants.  Labs remarkable for hyperglycemia without DKA. Anion gap is 14. Hemoglobin has trended  down 3 g in the past year. Patient denies blood in the stool and is guaiac negative. This can be followed as an outpatient.  Patient has had no vomiting in the ED. Abdominal pain is resolved. She's tolerating by mouth. She feels back to baseline.   CT scan shows 3 mm right mid ureteral calculus. Patient did not make much urine so unable to obtain UA. Questionable calyceal rupture was discussed with oncall urologist Dr. Michela Pitcher and Dr. Dorann Lodge, radiologist.  Dr. Michela Pitcher states this can be followed as an outpatient and he would like to see her in the office on Monday. Patient will be  discharged with antiemetics and will have dialysis later today.  Ezequiel Essex, MD 03/27/13 [phone]

## 2013-03-27 NOTE — ED Notes (Signed)
Pt drinking diet ginger ale

## 2013-03-27 NOTE — ED Notes (Signed)
RT could not get arterial blood, tried IV site after infusion of NS, wasted 6 mls, and pulled blood for labs. Pt drinking 2nd half of contrast

## 2013-03-27 NOTE — ED Notes (Signed)
Fistula in left forearm, felt thrill and heard bruit

## 2013-03-27 NOTE — ED Notes (Signed)
Vomiting all day with abd pain, no diarrhea. Dialysis m/w/f

## 2013-03-27 NOTE — ED Notes (Signed)
Patient given discharge instruction, verbalized understand. IV removed, band aid applied. Patient ambulatory out of the department.  

## 2013-07-30 NOTE — Progress Notes (Signed)
6 month follow up. C/O chest pain, SOB on and off.

## 2013-07-30 NOTE — Progress Notes (Signed)
Karen Fuller, ANP        NAME:  Karen Fuller   DOB:   1961/05/25   MRN:   [id number]   PCP:  Suella Broad, MD    ??????    Subjective:     The patient is a 53 y.o. year old female who complains of chest pain.  The chest pain began a few months ago and the pattern has been recurrent. The chest pain is described as a mild pain. The chest pain is described as being located in the left anterior chest area. The chest pain radiates to the  Scapula and left arm. There are no precipitating factors. The symptoms have no aggravating factors. The symptoms have no relieving factors. The symptoms have been associated with dyspnea and nausea, with vomiting at times.   Previous evaluations include ECG. Each episode lasts minutes.      Past Medical History   Diagnosis Date   ??? Reflux 01/12/2009   ??? DM (diabetes mellitus) 01/12/2009   ??? HTN (hypertension) 01/12/2009   ??? Chronic kidney failure 01/12/2009   ??? Abdominal pain 01/12/2009   ??? Nausea & vomiting 01/12/2009   ??? Gastroparesis 01/12/2009   ??? GERD (gastroesophageal reflux disease)    ??? Renal disease    ??? Anemia NEC    ??? Musculoskeletal disorder    ??? Depression    ??? Calculus of kidney      History     Social History   ??? Marital Status: MARRIED     Spouse Name: N/A     Number of Children: N/A   ??? Years of Education: N/A     Occupational History   ??? Not on file.     Social History Main Topics   ??? Smoking status: Never Smoker    ??? Smokeless tobacco: Not on file   ??? Alcohol Use: No   ??? Drug Use: No   ??? Sexual Activity: Not on file     Other Topics Concern   ??? Not on file     Social History Narrative     Family History   Problem Relation Age of Onset   ??? Cancer Mother    ??? Diabetes Mother    ??? Hypertension Mother    ??? Stroke Father        Review of Systems  General: Pt denies excessive weight gain or loss. Pt is able to conduct ADL's  HEENT: Denies blurred vision, headaches, epistaxis and difficulty swallowing.  Respiratory: Denies shortness of breath, DOE,  wheezing or stridor.  Cardiovascular: Reports precordial pain,Denies palpitations, edema or PND  Gastrointestinal: Denies poor appetite, indigestion, abdominal pain or blood in stool  Musculoskeletal: Denies pain or swelling from muscles or joints  Neurologic: Denies tremor, paresthesias, or sensory motor disturbance  Skin: Denies rash, itching or texture change.      Objective:       Filed Vitals:    07/30/13 0859   BP: 96/54   Pulse: 92   Resp: 16   Height: 5\' 2"  (1.575 m)   Weight: 235 lb 4.8 oz (106.731 kg)    Body mass index is 43.03 kg/(m^2).      General??  Mental Status??- Alert. General Appearance??- Not in acute distress.    Chest and Lung Exam??  Inspection:??Accessory muscles??- No use of accessory muscles in breathing.  Auscultation:??  Breath sounds:??- Normal.    Cardiovascular??  Inspection:??Jugular vein??- Bilateral??- Inspection Normal.  Palpation/Percussion:??  Apical Impulse:??- Normal.  Auscultation:??Rhythm??- Regular. Heart Sounds??- S1 WNL and S2 WNL. No S3 or S4.  Murmurs & Other Heart Sounds:??Auscultation of the heart reveals - No Murmurs.    Peripheral Vascular??  Upper Extremity:??Inspection??- Bilateral??- No Cyanotic nailbeds or Digital clubbing.  Lower Extremity:??  Palpation:??Edema??- Bilateral??- No edema.        Data Review:     EKG -sinus    Medications reviewed  Current Outpatient Prescriptions   Medication Sig   ??? clopidogrel (PLAVIX) 75 mg tablet Take  by mouth daily.   ??? famotidine (PEPCID) 20 mg tablet Take 20 mg by mouth two (2) times a day.   ??? fenofibrate nanocrystallized (TRICOR) 48 mg tablet Take  by mouth daily.   ??? cinacalcet (SENSIPAR) 30 mg tablet Take 30 mg by mouth daily.   ??? lisinopril (PRINIVIL, ZESTRIL) 20 mg tablet Take  by mouth daily.   ??? traMADol (ULTRAM) 50 mg tablet Take 50 mg by mouth every six (6) hours as needed for Pain.   ??? hydrALAZINE (APRESOLINE) 50 mg tablet Take 25 mg by mouth four (4) times daily.   ??? sevelamer carbonate (RENVELA) 800 mg tab tab Take  by mouth three (3)  times daily.   ??? pravastatin (PRAVACHOL) 20 mg tablet Take 20 mg by mouth daily.     ??? insulin lispro (HUMALOG) 100 unit/mL injection 15 Units by SubCUTAneous route once. With Each Meal    ??? aspirin 81 mg chewable tablet Take 81 mg by mouth daily.   ??? calcium acetate (PHOSLO) 667 mg Cap Take  by mouth three (3) times daily (with meals).   ??? pregabalin (LYRICA) 75 mg capsule Take 75 mg by mouth two (2) times a day.   ??? quetiapine (SEROQUEL) 50 mg tablet Take 150 mg by mouth daily.   ??? amlodipine (NORVASC) 10 mg tablet Take  by mouth daily.   ??? carvedilol (COREG) 25 mg tablet Take 25 mg by mouth two (2) times daily (with meals).   ??? insulin glargine (LANTUS) 100 unit/mL injection 25 Units by SubCUTAneous route. At Bedtime       No current facility-administered medications for this visit.         Assessment:     Patient Active Problem List   Diagnosis Code   ??? Reflux 530.81   ??? DM (diabetes mellitus) 250.00   ??? HTN (hypertension) 401.9   ??? Chronic kidney failure 585.9   ??? Abdominal pain 789.00   ??? Nausea & vomiting 787.01   ??? Gastroparesis 536.3   ??? Gastroparesis 536.3   ??? Chronic kidney failure 585.9   ??? Nausea & vomiting 787.01   ??? Systolic murmur 785.2         [id number]-9-CM    1. HTN (hypertension) 401.9 AMB POC EKG ROUTINE W/ 12 LEADS, INTER & REP     STRESS TEST LEXISCAN/CARDIOLITE   2. DM (diabetes mellitus) 250.00 STRESS TEST LEXISCAN/CARDIOLITE   3. Chest pain, unspecified 786.50 STRESS TEST LEXISCAN/CARDIOLITE   4. Gastroparesis 536.3 STRESS TEST LEXISCAN/CARDIOLITE   5. Angina, class II 413.9 STRESS TEST LEXISCAN/CARDIOLITE       Plan:     Patient presents with complaints of chest pain. Will evaluate for ischemia with nuclear stress test (lexiscan as she is wheelchair bound).  On daily ASA.    Amy H Wenzel, ANP    BENIGN ESSENTIAL HYPERTENSION   Well controlled. Continue current meds.     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     GASTROPARESIS- S/P GASTRIC PACEMAKER    Pt seen and examined in details. Agree with NP A&P.      Marrian Salvage, MD

## 2013-08-18 MED ORDER — REGADENOSON 0.4 MG/5 ML IV SYRINGE
0.4 mg/5 mL | Freq: Once | INTRAVENOUS | Status: AC
Start: 2013-08-18 — End: 2013-08-18

## 2013-08-18 MED ORDER — KIT FOR THE PREPARATION OF TC-99M-TETROFOSMIN 0.23 MG IV SOLUTION
0.23 mg | Freq: Once | INTRAVENOUS | Status: AC
Start: 2013-08-18 — End: 2013-08-18

## 2013-08-18 NOTE — Progress Notes (Signed)
Completed lexiscan stress test

## 2013-08-20 NOTE — Progress Notes (Signed)
Test scanned, please advise

## 2013-08-24 NOTE — Telephone Encounter (Signed)
-----   Message from Marrian SalvageMohammad Sohail Chaudhry, MD sent at 08/24/2013 10:11 AM EDT -----  Inform her stress test is ok.

## 2013-08-24 NOTE — Telephone Encounter (Signed)
Called patient informed stress test is ok per MD Chaudhry.

## 2014-01-12 NOTE — Progress Notes (Signed)
01/12/2014 11:32 AM      Subjective:     Karen Fuller is seen for follow up visit. Per pt over last few month her SOB has worsened. She is unable to do any physical activity. No CP. She had normal stress test in March. Due to rapid progression of SOB (angina equivalent), concern is that if she had significant CAD. Has been using CPAP regularly.     BP 118/62 mmHg   Pulse 72   Ht 5\' 2"  (1.575 m)  Current Outpatient Prescriptions   Medication Sig   ??? clopidogrel (PLAVIX) 75 mg tablet Take  by mouth daily.   ??? famotidine (PEPCID) 20 mg tablet Take 20 mg by mouth two (2) times a day.   ??? fenofibrate nanocrystallized (TRICOR) 48 mg tablet Take  by mouth daily.   ??? cinacalcet (SENSIPAR) 30 mg tablet Take 30 mg by mouth daily.   ??? lisinopril (PRINIVIL, ZESTRIL) 20 mg tablet Take  by mouth daily.   ??? traMADol (ULTRAM) 50 mg tablet Take 50 mg by mouth every six (6) hours as needed for Pain.   ??? hydrALAZINE (APRESOLINE) 50 mg tablet Take 25 mg by mouth four (4) times daily.   ??? sevelamer carbonate (RENVELA) 800 mg tab tab Take  by mouth three (3) times daily.   ??? pravastatin (PRAVACHOL) 20 mg tablet Take 20 mg by mouth daily.     ??? insulin lispro (HUMALOG) 100 unit/mL injection 15 Units by SubCUTAneous route once. With Each Meal    ??? aspirin 81 mg chewable tablet Take 81 mg by mouth daily.   ??? calcium acetate (PHOSLO) 667 mg Cap Take  by mouth three (3) times daily (with meals).   ??? pregabalin (LYRICA) 75 mg capsule Take 75 mg by mouth two (2) times a day.   ??? quetiapine (SEROQUEL) 50 mg tablet Take 150 mg by mouth daily.   ??? amlodipine (NORVASC) 10 mg tablet Take  by mouth daily.   ??? carvedilol (COREG) 25 mg tablet Take 25 mg by mouth two (2) times daily (with meals).   ??? insulin glargine (LANTUS) 100 unit/mL injection 25 Units by SubCUTAneous route. At Bedtime       No current facility-administered medications for this visit.         Objective:      Visit Vitals   Item Reading    ??? BP 118/62 mmHg   ??? Pulse 72   ??? Ht 5\' 2"  (1.575 m)       Data Review:     EKG: Normal sinus rhythm, no acute st/t changes    Reviewed and/or ordered active problem list, medication list tests    Past Medical History   Diagnosis Date   ??? Reflux 01/12/2009   ??? DM (diabetes mellitus) (HCC) 01/12/2009   ??? HTN (hypertension) 01/12/2009   ??? Chronic kidney failure 01/12/2009   ??? Abdominal pain 01/12/2009   ??? Nausea & vomiting 01/12/2009   ??? Gastroparesis 01/12/2009   ??? GERD (gastroesophageal reflux disease)    ??? Renal disease    ??? Anemia NEC    ??? Musculoskeletal disorder    ??? Depression    ??? Calculus of kidney       Past Surgical History   Procedure Laterality Date   ??? Hx cesarean section       x2   ??? Hx cholecystectomy       Allergies   Allergen Reactions   ??? Lortab [Hydrocodone-Acetaminophen] Other (comments)  Stopped Breathing     ??? Tylenol [Acetaminophen] Other (comments)     Stopped Breathing   ??? Ambien [Zolpidem] Other (comments)     Hallucinations   ??? Hydromorphone Shortness of Breath   ??? Xanax [Alprazolam] Other (comments)     hallucinations      Family History   Problem Relation Age of Onset   ??? Cancer Mother    ??? Diabetes Mother    ??? Hypertension Mother    ??? Stroke Father       History     Social History   ??? Marital Status: MARRIED     Spouse Name: N/A     Number of Children: N/A   ??? Years of Education: N/A     Occupational History   ??? Not on file.     Social History Main Topics   ??? Smoking status: Never Smoker    ??? Smokeless tobacco: Not on file   ??? Alcohol Use: No   ??? Drug Use: No   ??? Sexual Activity: Not on file     Other Topics Concern   ??? Not on file     Social History Narrative         Review of Systems??    General:??Not Present- Anorexia, Chills, Dietary Changes, Fatigue, Fever, Medication Changes, Night Sweats, Weight Gain > 10lbs. and Weight Loss > 10lbs..  Skin:??Not Present- Bruising and Excessive Sweating.  HEENT:??Not Present- Headache, Visual Loss and Vertigo.   Respiratory:??Not Present- Cough, Snoring and Wheezing.  Cardiovascular:??Not Present- Abnormal Blood Pressure, Claudications,Edema, Fainting / Blacking Out, Irregular Heart Beat, Night Cramps, Orthopnea, Palpitations, Paroxysmal Nocturnal Dyspnea, Rapid Heart Rate, and Swelling of Extremities.  Gastrointestinal:??Not Present- Black, Tarry Stool, Bloody Stool, Diarrhea, Hematemesis, Rectal Bleeding and Vomiting.  Musculoskeletal:??Not Present- Muscle Pain and Muscle Weakness.  Neurological:??Not Present- Dizziness.  Psychiatric:??Not Present- Depression.  Endocrine:??Not Present- Cold Intolerance, Heat Intolerance and Thyroid Problems.  Hematology:??Not Present- Abnormal Bleeding, Anemia, Blood Clots and Easy Bruising.    ??  Physical Exam??  The physical exam findings are as follows:    ??  General??  Mental Status??- Alert. General Appearance??- Not in acute distress.      Chest and Lung Exam??  Inspection:??Accessory muscles??- No use of accessory muscles in breathing.  Auscultation:??  Breath sounds:??- Normal.      Cardiovascular??  Inspection:??Jugular vein??- Bilateral??- Inspection Normal.  Palpation/Percussion:??  Apical Impulse:??- Normal.  Auscultation:??Rhythm??- Regular. Heart Sounds??- S1 WNL and S2 WNL. No S3 or S4.  Murmurs & Other Heart Sounds:??Auscultation of the heart reveals - No Murmurs.  Carotid arteries??- No Carotid bruit.      Peripheral Vascular??  Upper Extremity:??Inspection??- Bilateral??- No Cyanotic nailbeds or Digital clubbing.  Lower Extremity:??  Palpation:??Dorsalis pedis pulse??- Bilateral??- Normal. Posterior tibia pulse - Bilateral??- Normal. Edema??- Bilateral??- No edema.      Assessment:       ICD-9-CM    1. Angina, class III (HCC) 413.9    2. SOB (shortness of breath) 786.05 AMB POC EKG ROUTINE W/ 12 LEADS, INTER & REP   3. Essential hypertension 401.9    4. Chronic kidney failure, stage 5 (HCC) 585.5    5. OSA on CPAP 327.23    6. Type 2 diabetes mellitus without complication (HCC) 250.00        Plan:      Due significant dyspnea, rapid progression of symptoms, adequate medical therapy and normal stress test, concern of critical CAD. Recommend cardiac cath. I discussed the risks/benefits/alternatives of the  procedure with the patient. Risks include (but are not limited to) bleeding, infection, cva/mi/tamponade/death. The patient understands and agrees to proceed.    BENIGN ESSENTIAL HYPERTENSION   Well controlled. Continue current meds.     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     GASTROPARESIS- S/P GASTRIC PACEMAKER

## 2014-01-12 NOTE — Progress Notes (Signed)
EST PT - SOB AT NIGHT, NO CP, EDEMA OR PALPS, UNABLE TO STAND FOR WEIGHT

## 2014-02-01 LAB — METABOLIC PANEL, COMPREHENSIVE
A-G Ratio: 0.8 — ABNORMAL LOW (ref 1.1–2.2)
ALT (SGPT): 20 U/L (ref 12–78)
AST (SGOT): 20 U/L (ref 15–37)
Albumin: 3.6 g/dL (ref 3.5–5.0)
Alk. phosphatase: 118 U/L — ABNORMAL HIGH (ref 45–117)
Anion gap: 12 mmol/L (ref 5–15)
BUN/Creatinine ratio: 4 — ABNORMAL LOW (ref 12–20)
BUN: 51 MG/DL — ABNORMAL HIGH (ref 6–20)
Bilirubin, total: 0.4 MG/DL (ref 0.2–1.0)
CO2: 27 mmol/L (ref 21–32)
Calcium: 8.5 MG/DL (ref 8.5–10.1)
Chloride: 97 mmol/L (ref 97–108)
Creatinine: 11.84 MG/DL — ABNORMAL HIGH (ref 0.45–1.15)
GFR est AA: 4 mL/min/{1.73_m2} — ABNORMAL LOW (ref >60)
GFR est non-AA: 3 mL/min/{1.73_m2} — ABNORMAL LOW (ref >60)
Globulin: 4.6 g/dL — ABNORMAL HIGH (ref 2.0–4.0)
Glucose: 144 mg/dL — ABNORMAL HIGH (ref 65–100)
Potassium: 5.4 mmol/L — ABNORMAL HIGH (ref 3.5–5.1)
Protein, total: 8.2 g/dL (ref 6.4–8.2)
Sodium: 136 mmol/L (ref 136–145)

## 2014-02-01 LAB — CBC W/O DIFF
HCT: 39.8 % (ref 35.0–47.0)
HGB: 12.9 g/dL (ref 11.5–16.0)
MCH: 30.4 PG (ref 26.0–34.0)
MCHC: 32.4 g/dL (ref 30.0–36.5)
MCV: 93.6 FL (ref 80.0–99.0)
PLATELET: 190 10*3/uL (ref 150–400)
RBC: 4.25 M/uL (ref 3.80–5.20)
RDW: 14.3 % (ref 11.5–14.5)
WBC: 8 10*3/uL (ref 3.6–11.0)

## 2014-02-01 LAB — GLUCOSE, POC
Glucose (POC): 133 mg/dL — ABNORMAL HIGH (ref 65–100)
Glucose (POC): 85 mg/dL (ref 65–100)

## 2014-02-01 LAB — PROTHROMBIN TIME + INR
INR: 1.1 (ref 0.9–1.1)
Prothrombin time: 10.9 s (ref 9.0–11.1)

## 2014-02-01 MED ORDER — MIDAZOLAM 1 MG/ML IJ SOLN
1 mg/mL | INTRAMUSCULAR | Status: DC | PRN
Start: 2014-02-01 — End: 2014-02-01
  Administered 2014-02-01: 18:00:00 via INTRAVENOUS

## 2014-02-01 MED ORDER — ISOSORBIDE MONONITRATE SR 30 MG 24 HR TAB
30 mg | ORAL_TABLET | Freq: Every day | ORAL | Status: DC
Start: 2014-02-01 — End: 2014-02-25

## 2014-02-01 MED ORDER — HEPARIN (PORCINE) 1,000 UNIT/ML IJ SOLN
1000 unit/mL | INTRAMUSCULAR | Status: DC | PRN
Start: 2014-02-01 — End: 2014-02-01
  Administered 2014-02-01: 19:00:00 via INTRAVENOUS

## 2014-02-01 MED ORDER — VERAPAMIL 2.5 MG/ML IV
2.5 mg/mL | INTRAVENOUS | Status: AC
Start: 2014-02-01 — End: 2014-02-01
  Administered 2014-02-01: 18:00:00 via INTRA_ARTERIAL

## 2014-02-01 MED ORDER — HEPARIN (PORCINE) IN NS (PF) 1,000 UNIT/500 ML IV
1000 unit/500 mL | INTRAVENOUS | Status: DC
Start: 2014-02-01 — End: 2014-02-02
  Administered 2014-02-01: 18:00:00

## 2014-02-01 MED ORDER — CALCIUM ACETATE 667 MG TAB
667 mg | Freq: Three times a day (TID) | ORAL | Status: DC
Start: 2014-02-01 — End: 2014-02-02
  Administered 2014-02-01: 22:00:00 via ORAL

## 2014-02-01 MED ORDER — ADENOSINE (DIAGNOSTIC) 3 MG/ML IV SOLN
3 mg/mL | INTRAVENOUS | Status: DC
Start: 2014-02-01 — End: 2014-02-01

## 2014-02-01 MED ORDER — TRAMADOL 50 MG TAB
50 mg | Freq: Four times a day (QID) | ORAL | Status: DC | PRN
Start: 2014-02-01 — End: 2014-02-02

## 2014-02-01 MED ORDER — NITROGLYCERIN 0.1 MG/ML (100 MCG/ML) D5W COMPOUNDED INJECTION
0.1 mg/mL | INTRAVENOUS | Status: DC | PRN
Start: 2014-02-01 — End: 2014-02-01

## 2014-02-01 MED ORDER — HEPARIN (PORCINE) IN NS (PF) 1,000 UNIT/500 ML IV
1000 unit/500 mL | Freq: Once | INTRAVENOUS | Status: AC
Start: 2014-02-01 — End: 2014-02-01
  Administered 2014-02-01: 18:00:00

## 2014-02-01 MED ORDER — CINACALCET 30 MG TAB
30 mg | Freq: Every day | ORAL | Status: DC
Start: 2014-02-01 — End: 2014-02-02

## 2014-02-01 MED ORDER — SODIUM CHLORIDE 0.9 % IJ SYRG
INTRAMUSCULAR | Status: DC | PRN
Start: 2014-02-01 — End: 2014-02-02

## 2014-02-01 MED ORDER — INSULIN GLARGINE 100 UNIT/ML INJECTION
100 unit/mL | Freq: Every evening | SUBCUTANEOUS | Status: DC
Start: 2014-02-01 — End: 2014-02-02

## 2014-02-01 MED ORDER — HEPARIN (PORCINE) 1,000 UNIT/ML IJ SOLN
1000 unit/mL | INTRAMUSCULAR | Status: AC
Start: 2014-02-01 — End: 2014-02-01
  Administered 2014-02-01: 18:00:00 via INTRAVENOUS

## 2014-02-01 MED ORDER — IODIXANOL 320 MG/ML IV SOLN
320 mg iodine/mL | Freq: Once | INTRAVENOUS | Status: AC
Start: 2014-02-01 — End: 2014-02-01

## 2014-02-01 MED ORDER — MIDAZOLAM 1 MG/ML IJ SOLN
1 mg/mL | INTRAMUSCULAR | Status: AC
Start: 2014-02-01 — End: 2014-02-01
  Administered 2014-02-01: 18:00:00 via INTRAVENOUS

## 2014-02-01 MED ORDER — LISINOPRIL 5 MG TAB
5 mg | Freq: Every day | ORAL | Status: DC
Start: 2014-02-01 — End: 2014-02-02

## 2014-02-01 MED ORDER — VERAPAMIL 2.5 MG/ML IV
2.5 mg/mL | INTRAVENOUS | Status: DC | PRN
Start: 2014-02-01 — End: 2014-02-01
  Administered 2014-02-01: 18:00:00 via INTRA_ARTERIAL

## 2014-02-01 MED ORDER — FENTANYL CITRATE (PF) 50 MCG/ML IJ SOLN
50 mcg/mL | INTRAMUSCULAR | Status: DC
Start: 2014-02-01 — End: 2014-02-02
  Administered 2014-02-01: 18:00:00 via INTRAVENOUS

## 2014-02-01 MED ORDER — SODIUM CHLORIDE 0.9 % IV
INTRAVENOUS | Status: DC
Start: 2014-02-01 — End: 2014-02-02

## 2014-02-01 MED ORDER — LIDOCAINE (PF) 10 MG/ML (1 %) IJ SOLN
10 mg/mL (1 %) | INTRAMUSCULAR | Status: AC
Start: 2014-02-01 — End: 2014-02-01
  Administered 2014-02-01: 18:00:00 via INTRADERMAL

## 2014-02-01 MED ORDER — IODIXANOL 320 MG/ML IV SOLN
320 mg iodine/mL | INTRAVENOUS | Status: AC
Start: 2014-02-01 — End: 2014-02-01
  Administered 2014-02-01: 19:00:00 via INTRA_ARTERIAL

## 2014-02-01 MED ORDER — FENTANYL CITRATE (PF) 50 MCG/ML IJ SOLN
50 mcg/mL | INTRAMUSCULAR | Status: DC | PRN
Start: 2014-02-01 — End: 2014-02-01
  Administered 2014-02-01 (×2): via INTRAVENOUS

## 2014-02-01 MED ORDER — QUETIAPINE 25 MG TAB
25 mg | Freq: Every evening | ORAL | Status: DC
Start: 2014-02-01 — End: 2014-02-02

## 2014-02-01 MED ORDER — FENOFIBRATE NANOCRYSTALLIZED 48 MG TAB
48 mg | Freq: Every day | ORAL | Status: DC
Start: 2014-02-01 — End: 2014-02-02

## 2014-02-01 MED ORDER — PREGABALIN 75 MG CAP
75 mg | Freq: Two times a day (BID) | ORAL | Status: DC
Start: 2014-02-01 — End: 2014-02-02
  Administered 2014-02-01: 22:00:00 via ORAL

## 2014-02-01 MED ORDER — LIDOCAINE HCL 1 % (10 MG/ML) IJ SOLN
10 mg/mL (1 %) | INTRAMUSCULAR | Status: DC | PRN
Start: 2014-02-01 — End: 2014-02-01

## 2014-02-01 MED ORDER — SODIUM CHLORIDE 0.9 % IV
3 mg/mL | INTRAVENOUS | Status: DC
Start: 2014-02-01 — End: 2014-02-01
  Administered 2014-02-01: 19:00:00 via INTRAVENOUS

## 2014-02-01 MED ORDER — SEVELAMER 400 MG TAB
400 mg | Freq: Three times a day (TID) | ORAL | Status: DC
Start: 2014-02-01 — End: 2014-02-02
  Administered 2014-02-01: 22:00:00 via ORAL

## 2014-02-01 MED ORDER — LIDOCAINE HCL 1 % (10 MG/ML) IJ SOLN
10 mg/mL (1 %) | INTRAMUSCULAR | Status: AC
Start: 2014-02-01 — End: 2014-02-01
  Administered 2014-02-01: 18:00:00 via INTRADERMAL

## 2014-02-01 MED ORDER — ASPIRIN 81 MG CHEWABLE TAB
81 mg | Freq: Every day | ORAL | Status: DC
Start: 2014-02-01 — End: 2014-02-02

## 2014-02-01 MED ORDER — CARVEDILOL 12.5 MG TAB
12.5 mg | Freq: Two times a day (BID) | ORAL | Status: DC
Start: 2014-02-01 — End: 2014-02-02

## 2014-02-01 MED ORDER — INSULIN LISPRO 100 UNIT/ML INJECTION
100 unit/mL | Freq: Once | SUBCUTANEOUS | Status: DC
Start: 2014-02-01 — End: 2014-02-01
  Administered 2014-02-01: 20:00:00 via SUBCUTANEOUS

## 2014-02-01 MED ORDER — ISOSORBIDE MONONITRATE SR 30 MG 24 HR TAB
30 mg | Freq: Every day | ORAL | Status: DC
Start: 2014-02-01 — End: 2014-02-02

## 2014-02-01 MED ORDER — HEPARIN (PORCINE) IN NS (PF) 1,000 UNIT/500 ML IV
1000 unit/500 mL | Freq: Once | INTRAVENOUS | Status: AC
Start: 2014-02-01 — End: 2014-02-01
  Administered 2014-02-01 (×2)

## 2014-02-01 MED ORDER — NITROGLYCERIN 0.1 MG/ML (100 MCG/ML) D5W COMPOUNDED INJECTION
0.1 mg/mL | INTRAVENOUS | Status: AC
Start: 2014-02-01 — End: 2014-02-01
  Administered 2014-02-01: 18:00:00 via INTRA_ARTERIAL

## 2014-02-01 MED ORDER — LIDOCAINE (PF) 10 MG/ML (1 %) IJ SOLN
10 mg/mL (1 %) | INTRAMUSCULAR | Status: DC | PRN
Start: 2014-02-01 — End: 2014-02-01

## 2014-02-01 MED ORDER — AMLODIPINE 5 MG TAB
5 mg | Freq: Every day | ORAL | Status: DC
Start: 2014-02-01 — End: 2014-02-02

## 2014-02-01 MED ORDER — SODIUM CHLORIDE 0.9 % IJ SYRG
Freq: Three times a day (TID) | INTRAMUSCULAR | Status: DC
Start: 2014-02-01 — End: 2014-02-02
  Administered 2014-02-01: 20:00:00 via INTRAVENOUS

## 2014-02-01 MED ORDER — FAMOTIDINE 20 MG TAB
20 mg | Freq: Two times a day (BID) | ORAL | Status: DC
Start: 2014-02-01 — End: 2014-02-02
  Administered 2014-02-01: 22:00:00 via ORAL

## 2014-02-01 MED ORDER — HYDRALAZINE 25 MG TAB
25 mg | Freq: Four times a day (QID) | ORAL | Status: DC
Start: 2014-02-01 — End: 2014-02-02

## 2014-02-01 MED ORDER — PRAVASTATIN 10 MG TAB
10 mg | Freq: Every day | ORAL | Status: DC
Start: 2014-02-01 — End: 2014-02-02

## 2014-02-01 MED FILL — INSULIN GLARGINE 100 UNIT/ML INJECTION: 100 unit/mL | SUBCUTANEOUS | Qty: 0.25

## 2014-02-01 MED FILL — XYLOCAINE-MPF 10 MG/ML (1 %) INJECTION SOLUTION: 10 mg/mL (1 %) | INTRAMUSCULAR | Qty: 2

## 2014-02-01 MED FILL — LIDOCAINE HCL 1 % (10 MG/ML) IJ SOLN: 10 mg/mL (1 %) | INTRAMUSCULAR | Qty: 20

## 2014-02-01 MED FILL — RENAGEL 400 MG TABLET: 400 mg | ORAL | Qty: 2

## 2014-02-01 MED FILL — FENTANYL CITRATE (PF) 50 MCG/ML IJ SOLN: 50 mcg/mL | INTRAMUSCULAR | Qty: 2

## 2014-02-01 MED FILL — LYRICA 75 MG CAPSULE: 75 mg | ORAL | Qty: 1

## 2014-02-01 MED FILL — ELIPHOS 667 MG TABLET: 667 mg | ORAL | Qty: 1

## 2014-02-01 MED FILL — SODIUM CHLORIDE 0.9 % IV: INTRAVENOUS | Qty: 100

## 2014-02-01 MED FILL — NITROGLYCERIN 0.1 MG/ML (100 MCG/ML) D5W COMPOUNDED INJECTION: 0.1 mg/mL | INTRAVENOUS | Qty: 10

## 2014-02-01 MED FILL — ADENOSCAN 3 MG/ML INTRAVENOUS SOLUTION: 3 mg/mL | INTRAVENOUS | Qty: 30

## 2014-02-01 MED FILL — MIDAZOLAM 1 MG/ML IJ SOLN: 1 mg/mL | INTRAMUSCULAR | Qty: 2

## 2014-02-01 MED FILL — HEPARIN (PORCINE) IN NS (PF) 1,000 UNIT/500 ML IV: 1000 unit/500 mL | INTRAVENOUS | Qty: 1500

## 2014-02-01 MED FILL — VERAPAMIL 2.5 MG/ML IV: 2.5 mg/mL | INTRAVENOUS | Qty: 2

## 2014-02-01 MED FILL — HEPARIN (PORCINE) 1,000 UNIT/ML IJ SOLN: 1000 unit/mL | INTRAMUSCULAR | Qty: 10

## 2014-02-01 MED FILL — FAMOTIDINE 20 MG TAB: 20 mg | ORAL | Qty: 1

## 2014-02-01 MED FILL — TRICOR 48 MG TABLET: 48 mg | ORAL | Qty: 1

## 2014-02-01 MED FILL — VISIPAQUE 320 MG IODINE/ML INTRAVENOUS SOLUTION: 320 mg iodine/mL | INTRAVENOUS | Qty: 200

## 2014-02-01 NOTE — Procedures (Signed)
[facility] Team   Vitals   Pre   Post   Assessment   Pre   Post     Temp   97.5 LOC   A&Ox4   HR    91 Lungs    WDL   B/P   172/77 Cardiac    WDL   Resp    16 Skin    2 puncture sites from cardiac cath   Pain level   0/10 Edema   Trace Generalized   Orders:    Duration:   Start:   1808 End:   2126 Total:   3.25   Dialyzer:   F-180   K Bath:   2   Ca Bath:   2.5   Na/Bicarb:   140/35   Target Fluid Removal:     Access     Type & Location:   LUE Fistula   Labs     Obtained/Reviewed   Critical Results Called   Date when labs were drawn-8/17  Hgb-12.9  K-5.4  Ca-8.5  Bun-51  Creat-11.84   Medications/ Blood Products Given     Name   Dose   Route and Time                     Blood Volume Processed (BVP):  66.7 Net Fluid   Removed:   3700   Comments     1930: Received report from [employer], Charity fundraiser. Report consisted of SBAR and Procedure Summary. Opportunity for questions provided. Pt resting comfortably at this time. No complaints noted.    2030: Pt resting comfortably at this time. Progressing towards HD goal.     2126: Tx ended. All possible blood returned to pt. Sites flushed with NS. Needles removed and pressure held for 10 minutes. Pt immediately discharged following hemodialysis. Sites checked again to ensure pt not bleeding after getting up and dressed.     Admiting Diagnosis: Cardiac Cath  Pt's previous clinic: [facility]  Consent signed: Yes  Hepatitis Status: Negative  Pre-Dialysis Wgt:   Machine #: [id number]  Telemetry status: Telemetry

## 2014-02-01 NOTE — Procedures (Signed)
[facility]   Vitals  Pre  Post  Assessment  Pre  Post    Temp  97.7  LOC  wnl    HR  91  Lungs  O2 @ 2lpm via NC    B/P  203/93  Cardiac  HTN, NSR    Resp  14  Skin  intact    Pain level  0  Edema  generalized      Orders:    Duration:  Start:  1808 End:   Total:     Dialyzer:  F-180   K Bath:  2   Ca Bath:  2.5   Na/Bicarb:  140/35   Target Fluid Removal:  4000     Access    Type & Location:  LUE AVG     Labs    Obtained/Reviewed   Critical Results Called  Date when labs were drawn- 02/01/2014  Hgb- 12.9 K- 5.4 Ca- 8.5 Bun- 51 Creat- 11.84     Medications/ Blood Products Given    Name  Dose  Route                     Blood Volume Processed (BVP):   Net Fluid   Removed:       Comments   Diagnosis-   Pt's previous clinic- [facility]  Consent signed- yes  Hepatitis Status- negative    HEMODIALYSIS INPATIENT Duration (hr): 3.5; Dialyzer: Other (specify); Dialysate Bath: K: 2; Dialysate Bath: CA: 2.5; Dialysate Bath: Bicarb: 35; Weight Loss (kg): 4; Target Fluid Removal (mL): 4; Access: Fistula; Blood Flow Rate (mL/min): 450; Dial (Order #[id number]) on 02/01/14    1808:  Arrived at patient bedside for ordered HD treatment.  Consent obtained.  Left lower arm AVG site intact without s/sx of infection.  Positive B/T noted.  Access achieved with two 15 gauge cannulation needles.  Lines aspirated and flushed without difficulty.  Needles secured with tape and HD treatment initiated.  Patient offers no questions or concerns.  Will monitor.    1936:  Report given to Maralyn Sago, RN to complete HD treatment.

## 2014-02-01 NOTE — Consults (Signed)
[email or web address]           Phone - [phone]   [facility]        Nephrology Consult Note     Patient: Karen Fuller MRN: [id number]  PATIENT WGN:FAOZPCP:Phys Other, MD (General)   Date of Birth: 01/05/1961  Age: 53 y.o.  Sex: female    ADMIT DATE:02/01/2014   CONSULTATION DATE:02/01/2014    REQUESTING PHYSICIAN: Dr.M.Chaudhry  REASON FOR CONSULTATION: Karen Fuller is a 53 y.o. African American female for whom we are asked to see regarding continuance of dialysis.     ASSESSMENT:   ?? End Stage Renal Disease -  ?? Anemia of CKD  ?? Secondary hyperparathyroidism of renal origin  ?? hypertension    PLAN:   Dialysis today after cardiac cath- remove 2-3 kg  Hold epogen due to high hb  Continue bp meds    ?? Active Problems:  ??   * No active hospital problems. *  ??     Subjective:   HPI: Karen Fuller is a 53 y.o. African American female.    The patient currently dialyzes on a schedule of mwf  [facility].. Patient was dialysed on Friday Dialysis is performed via left arm avf.  She is admitted for cardiac cath today.  She gains about 4 kg weight inbetween hd  She started hd in 2008.  Review of Systems:  No complains of SOB, cough,nausea,vomiting,fever,chills.      Allergies   Allergen Reactions   ??? Lortab [Hydrocodone-Acetaminophen] Other (comments)     Stopped Breathing     ??? Tylenol [Acetaminophen] Other (comments)     Stopped Breathing   ??? Ambien [Zolpidem] Other (comments)     Hallucinations   ??? Hydromorphone Shortness of Breath   ??? Xanax [Alprazolam] Other (comments)     hallucinations     PMHx:  has a past medical history of Reflux (01/12/2009); DM (diabetes mellitus) (HCC) (01/12/2009); HTN (hypertension) (01/12/2009); Chronic kidney failure (01/12/2009); Abdominal pain (01/12/2009); Nausea & vomiting (01/12/2009); Gastroparesis (01/12/2009); GERD (gastroesophageal reflux disease); Renal disease; Anemia NEC; Musculoskeletal disorder; Depression;  and Calculus of kidney. She also has no past medical history of Unspecified sleep apnea.   PSurgHx:  has past surgical history that includes cesarean section; cholecystectomy; other surgical; and orthopaedic.  PSocHx:  reports that she has never smoked. She does not have any smokeless tobacco history on file. She reports that she does not drink alcohol or use illicit drugs.   Family History   Problem Relation Age of Onset   ??? Cancer Mother    ??? Diabetes Mother    ??? Hypertension Mother    ??? Stroke Father    ??? Diabetes Sister    ??? Hypertension Sister      CODE STATUS:     Objective:    Vitals:    Filed Vitals:    02/01/14 0912 02/01/14 1506 02/01/14 1510 02/01/14 1515   BP: 178/85 186/74 174/68 191/70   Pulse: 93 88 87 86   Temp: 98.3 ??F (36.8 ??C) 97.6 ??F (36.4 ??C)     Resp: 20 20 16 16    Height: 5\' 2"  (1.575 m)      Weight: 106.595 kg (235 lb)      SpO2: 98% 98% 98% 98%     I&O's:     Physical Exam:  General:Alert, No distress,   Eyes:No scleral icterus, No conjunctival pallor  Neck:Supple,no mass palpable,no thyromegaly  Lungs:Clears to auscultation Bilaterally, normal respiratory effort  CVS:RRR, S1 S2 normal,  No rub, + LE edema  Abdomen:Soft, Non tender, No hepatosplenomegaly  Extremities:No cyanosis, No clubbing, left arm avf bruit +  Skin:No rash or lesions, Warm and DRY  Psych: AAO X 3, appropriate affect   NEURO:  Non focal  Dialysis Access:  Left arm avf      Care Plan discussed with:  Pt.    Chart reviewed.  ECG:: Rev:no  Xray/CT/US/MRI REV:no  Lab Data Personally Reviewed: (see below)    Recent Labs      02/01/14   0945   NA  136   K  5.4*   CL  97   CO2  27   BUN  51*   CREA  11.84*   GLU  144*   CA  8.5     Recent Labs      02/01/14   0945   WBC  8.0   HGB  12.9   HCT  39.8   PLT  190     Lab Results   Component Value Date/Time    SPECIMEN DESCRIPTION: URINE 02/16/2009 12:17 PM     Lab Results   Component Value Date/Time    CULTURE RESULT: NO GROWTH 1 DAY 02/16/2009 12:17 PM      Prior to Admission medications    Medication Sig Start Date End Date Taking? Authorizing Provider   famotidine (PEPCID) 20 mg tablet Take 20 mg by mouth two (2) times a day.   Yes Historical Provider   fenofibrate nanocrystallized (TRICOR) 48 mg tablet Take  by mouth daily.   Yes Historical Provider   cinacalcet (SENSIPAR) 30 mg tablet Take 30 mg by mouth daily.   Yes Historical Provider   lisinopril (PRINIVIL, ZESTRIL) 20 mg tablet Take  by mouth daily.   Yes Historical Provider   traMADol (ULTRAM) 50 mg tablet Take 50 mg by mouth every six (6) hours as needed for Pain.   Yes Historical Provider   hydrALAZINE (APRESOLINE) 50 mg tablet Take 25 mg by mouth four (4) times daily.   Yes Historical Provider   sevelamer carbonate (RENVELA) 800 mg tab tab Take  by mouth three (3) times daily.   Yes Historical Provider   pravastatin (PRAVACHOL) 20 mg tablet Take 20 mg by mouth daily.     Yes Historical Provider   insulin lispro (HUMALOG) 100 unit/mL injection 15 Units by SubCUTAneous route once. With Each Meal    Yes Historical Provider   aspirin 81 mg chewable tablet Take 81 mg by mouth daily.   Yes Historical Provider   calcium acetate (PHOSLO) 667 mg Cap Take  by mouth three (3) times daily (with meals).   Yes Historical Provider   pregabalin (LYRICA) 75 mg capsule Take 75 mg by mouth two (2) times a day.   Yes Historical Provider   quetiapine (SEROQUEL) 50 mg tablet Take 150 mg by mouth daily.   Yes Historical Provider   amlodipine (NORVASC) 10 mg tablet Take  by mouth daily.   Yes Historical Provider   carvedilol (COREG) 25 mg tablet Take 25 mg by mouth two (2) times daily (with meals).   Yes Historical Provider   insulin glargine (LANTUS) 100 unit/mL injection 25 Units by SubCUTAneous route. At Bedtime     Yes Historical Provider     Medications list Personally Reviewed             Yes  No    Prior to Admission Medications   Prescriptions Last Dose Informant Patient Reported? Taking?    amlodipine (NORVASC) 10 mg tablet 01/31/2014 at 1000  Yes Yes   Sig: Take  by mouth daily.   aspirin 81 mg chewable tablet 01/31/2014 at 1000  Yes Yes   Sig: Take 81 mg by mouth daily.   calcium acetate (PHOSLO) 667 mg Cap 01/31/2014 at 2100  Yes Yes   Sig: Take  by mouth three (3) times daily (with meals).   carvedilol (COREG) 25 mg tablet 01/31/2014 at 2000  Yes Yes   Sig: Take 25 mg by mouth two (2) times daily (with meals).   cinacalcet (SENSIPAR) 30 mg tablet 01/31/2014 at 2000  Yes Yes   Sig: Take 30 mg by mouth daily.   famotidine (PEPCID) 20 mg tablet 01/31/2014 at 2200  Yes Yes   Sig: Take 20 mg by mouth two (2) times a day.   fenofibrate nanocrystallized (TRICOR) 48 mg tablet 01/31/2014 at 1000  Yes Yes   Sig: Take  by mouth daily.   hydrALAZINE (APRESOLINE) 50 mg tablet 01/31/2014 at 2100  Yes Yes   Sig: Take 25 mg by mouth four (4) times daily.   insulin glargine (LANTUS) 100 unit/mL injection 01/31/2014 at 2100  Yes Yes   Sig: 25 Units by SubCUTAneous route. At Bedtime     insulin lispro (HUMALOG) 100 unit/mL injection 01/31/2014 at 2200  Yes Yes   Sig: 15 Units by SubCUTAneous route once. With Each Meal    lisinopril (PRINIVIL, ZESTRIL) 20 mg tablet 01/31/2014 at 1000  Yes Yes   Sig: Take  by mouth daily.   pravastatin (PRAVACHOL) 20 mg tablet 01/31/2014 at 2100  Yes Yes   Sig: Take 20 mg by mouth daily.     pregabalin (LYRICA) 75 mg capsule 01/31/2014 at 2100  Yes Yes   Sig: Take 75 mg by mouth two (2) times a day.   quetiapine (SEROQUEL) 50 mg tablet 01/31/2014 at 2100  Yes Yes   Sig: Take 150 mg by mouth daily.   sevelamer carbonate (RENVELA) 800 mg tab tab 01/31/2014 at 2100  Yes Yes   Sig: Take  by mouth three (3) times daily.   traMADol (ULTRAM) 50 mg tablet 01/31/2014 at Unknown time  Yes Yes   Sig: Take 50 mg by mouth every six (6) hours as needed for Pain.      Facility-Administered Medications: None       Thank you for allowing [place] to participate in the care this patient. We will follow patient with you.   Signed By: Rochele Raring, MD                         02/01/2014  [facility] [address]  [address], [address]  [place], [place] [address]  Phone - [phone] Fax - [phone]  [email or web address]

## 2014-02-01 NOTE — Progress Notes (Signed)
Report given to Ann Sheets.

## 2014-02-01 NOTE — Procedures (Signed)
[facility]   Vitals   Pre   Post   Assessment   Pre   Post     Temp   97.5 LOC   A&Ox4   HR    91 Lungs    WDL   B/P   172/77 Cardiac    WDL   Resp    16 Skin    2 puncture sites from cardiac cath   Pain level   0/10 Edema   Trace Generalized   Orders:    Duration:   Start:   1808 End:   2126 Total:   3.25   Dialyzer:   F-180   K Bath:   2   Ca Bath:   2.5   Na/Bicarb:   140/35   Target Fluid Removal:   4000mL   Access     Type & Location:   LUE Fistula   Labs     Obtained/Reviewed   Critical Results Called   Date when labs were drawn-8/17  Hgb-12.9  K-5.4  Ca-8.5  Bun-51  Creat-11.84   Medications/ Blood Products Given     Name   Dose   Route and Time                     Blood Volume Processed (BVP):  66.7 Net Fluid   Removed:   3700   Comments     1930: Received report from [place], RN. Report consisted of SBAR and Procedure Summary. Opportunity for questions provided. Pt resting comfortably at this time. No complaints noted.    2030: Pt resting comfortably at this time. Progressing towards HD goal.     2126: Tx ended. All possible blood returned to pt. Sites flushed with NS. Needles removed and pressure held for 10 minutes. Pt immediately discharged following hemodialysis. Sites checked again to ensure pt not bleeding after getting up and dressed.     Admiting Diagnosis: Cardiac Cath  Pt's previous clinic: [facility]  Consent signed: Yes  Hepatitis Status: Negative  Pre-Dialysis Wgt:   Machine #: [id number]  Telemetry status: Telemetry

## 2014-02-01 NOTE — H&P (Signed)
Office H&P noted. No change since then.

## 2014-02-01 NOTE — Procedures (Signed)
[facility]   Vitals  Pre  Post  Assessment  Pre  Post    Temp  97.7  LOC  wnl    HR  91  Lungs  O2 @ 2lpm via NC    B/P  203/93  Cardiac  HTN, NSR    Resp  14  Skin  intact    Pain level  0  Edema  generalized      Orders:    Duration:  Start:  1808 End:   Total:     Dialyzer:  F-180   K Bath:  2   Ca Bath:  2.5   Na/Bicarb:  140/35   Target Fluid Removal:  4000     Access    Type & Location:  LUE AVG     Labs    Obtained/Reviewed   Critical Results Called  Date when labs were drawn- 02/01/2014  Hgb- 12.9 K- 5.4 Ca- 8.5 Bun- 51 Creat- 11.84     Medications/ Blood Products Given    Name  Dose  Route                     Blood Volume Processed (BVP):   Net Fluid   Removed:       Comments   Diagnosis-   Pt's previous clinic- [facility]  Consent signed- yes  Hepatitis Status- negative    HEMODIALYSIS INPATIENT Duration (hr): 3.5; Dialyzer: Other (specify); Dialysate Bath: K: 2; Dialysate Bath: CA: 2.5; Dialysate Bath: Bicarb: 35; Weight Loss (kg): 4; Target Fluid Removal (mL): 4; Access: Fistula; Blood Flow Rate (mL/min): 450; Dial (Order #[id number]) on 02/01/14    1808:  Arrived at patient bedside for ordered HD treatment.  Consent obtained.  Left lower arm AVG site intact without s/sx of infection.  Positive B/T noted.  Access achieved with two 15 gauge cannulation needles.  Lines aspirated and flushed without difficulty.  Needles secured with tape and HD treatment initiated.  Patient offers no questions or concerns.  Will monitor.    1936:  Report given to Sarah, RN to complete HD treatment.

## 2014-02-01 NOTE — Progress Notes (Signed)
Discharge instructions reviewed with patient and her husband.  Prescription for Imdur called in to [facility] in [place], [place] at [phone].  Hard copy given to husband in case the pharmacy did not get prescription order.  Pt is now off dialysis.  Assisted in to sitting position.  Assisted pt in getting dressed.  Pt was able to transfer to her scooter chair.  Nurse accompanied pt and her husband to transportation Zenaida Niece that was provided by [employer].  Pt was discharged to go home.  Denied pain at time of discharge.

## 2014-02-01 NOTE — Progress Notes (Signed)
TRANSFER - OUT REPORT:    [facility]bal [facility], RN(name) on Karen Fuller  being transferred to [facility]CU(unit) for routine progression of care       Report consisted of patient???s Situation, Background, Assessment and   Recommendations(SBAR).     Information from the following report(s) Procedure Summary and MAR was reviewed with the receiving nurse.    Lines:       Opportunity for questions and clarification was provided.      Patient transported with:   Registered Nurse  Tech

## 2014-02-01 NOTE — Progress Notes (Signed)
NAME:Karen Fuller                   UUV:[id number]   DOB:1960/11/28    Patient seen and examined  She has h/o ESRD on hd MWF, [place]  Last hd on Friday via left arm avf    Plan for hd today    Signed By: Rochele Raring, MD                         02/01/2014  [facility]  [address]  [place], [place] [address]  Phone - [phone] Fax - [phone]  [email or web address]

## 2014-02-02 LAB — CULTURE, MRSA

## 2014-02-03 MED FILL — VERAPAMIL 2.5 MG/ML IV: 2.5 mg/mL | INTRAVENOUS | Qty: 2

## 2014-02-03 NOTE — Progress Notes (Signed)
Called pt to follow up on hospital visit to [facility], discharged on 02/01/14.  Pt verified name and DOB.    NN role explained to pt and pt states understanding.    Reviewed hospital discharge instructions with pt and pt states understanding.  Pt reports that cardiac cath site is not painful, no redness, no swelling.  Pt reports that she fell yesterday (02/02/14) on the way to the bathroom.  Pt states that she went to the ED in [place] and states that she was told that she was fine.  Pt states that she is not sure if she hit her head but states ED in [place], [place].      Reviewed hospital discharge instructions with pt and pt states that she has medications and will call for follow up appt.  Pt reports that she has transportation through [employer].    Hospital discharge instructions include:    About your hospitalization    You were admitted on: February 01, 2014 You last received care in the: [facility]    You were discharged on: February 01, 2014       Why you were hospitalized    Your primary diagnosis was: Angina, Class Ii (Hcc)    Your diagnoses also included: Dm (Diabetes Mellitus) (Hcc), Htn (Hypertension), Chronic Kidney Failure               Providers Seen During Your Hospitalizations    Provider Role Primary office phone    Marrian Salvage, MD Attending Provider [phone]      Your Primary Care Physician (PCP)    Primary Care Physician Office Phone Office Fax    OTHER, PHYS ** None ** ** None **      Follow-up Information    Follow up With Details Comments Contact Info    Marrian Salvage, MD Schedule an appointment as soon as possible for a visit in 3 weeks  [address]  [place] [address]  [phone]    Phys Other, MD   Patient can only remember the practice name and not the physician                  Current Discharge Medication List      START taking these medications     Dose & Instructions Dispensing Information Comments    isosorbide mononitrate ER 30 mg tablet    Commonly known as: IMDUR   Start taking on: 02/02/2014    Dose: 30 mg   Take 1 Tab by mouth daily.    Quantity: 30 Tab   Refills: 11          CONTINUE these medications which have NOT CHANGED     Dose & Instructions Dispensing Information Comments    amLODIPine 10 mg tablet   Commonly known as: NORVASC    Take by mouth daily.    Refills: 0        aspirin 81 mg chewable tablet    Dose: 81 mg   Take 81 mg by mouth daily.    Refills: 0        calcium acetate 667 mg Cap   Commonly known as: PHOSLO    Take by mouth three (3) times daily (with meals).    Refills: 0        cinacalcet 30 mg tablet   Commonly known as: SENSIPAR    Dose: 30 mg   Take 30 mg by mouth daily.    Refills: 0  COREG 25 mg tablet   Generic drug: carvedilol    Dose: 25 mg   Take 25 mg by mouth two (2) times daily (with meals).    Refills: 0        famotidine 20 mg tablet   Commonly known as: PEPCID    Dose: 20 mg   Take 20 mg by mouth two (2) times a day.    Refills: 0        fenofibrate nanocrystallized 48 mg tablet   Commonly known as: TRICOR    Take by mouth daily.    Refills: 0        HumaLOG 100 unit/mL injection   Generic drug: insulin lispro    Dose: 15 Units   15 Units by SubCUTAneous route once. With Each Meal    Refills: 0        hydrALAZINE 50 mg tablet   Commonly known as: APRESOLINE    Dose: 25 mg   Take 25 mg by mouth four (4) times daily.    Refills: 0        LANTUS 100 unit/mL injection   Generic drug: insulin glargine    Dose: 25 Units   - 25 Units by SubCUTAneous route. At Bedtime  -     Refills: 0        lisinopril 20 mg tablet   Commonly known as: PRINIVIL, ZESTRIL    Take by mouth daily.    Refills: 0        LYRICA 75 mg capsule   Generic drug: pregabalin    Dose: 75 mg   Take 75 mg by mouth two (2) times a day.    Refills: 0        PRAVACHOL 20 mg tablet   Generic drug: pravastatin    Dose: 20 mg   Take 20 mg by mouth daily.    Refills: 0        RENVELA 800 mg Tab tab   Generic drug: sevelamer carbonate     Take by mouth three (3) times daily.    Refills: 0        SEROquel 50 mg tablet   Generic drug: QUEtiapine    Dose: 150 mg   Take 150 mg by mouth daily.    Refills: 0        traMADol 50 mg tablet   Commonly known as: ULTRAM    Dose: 50 mg   Take 50 mg by mouth every six (6) hours as needed for Pain.    Refills: 0          Where to Get Your Medications    Information on where to get these meds will be given to you by the nurse or doctor.    -  isosorbide mononitrate ER 30 mg tablet                            Discharge Instructions          [address], [place], [place] [id number] [phone]        Patient ID:  Karen MeuseDeborah D Fuller  [id number]  52 y.o.  05/24/1961    Admit Date: 02/01/2014    Discharge Date: 02/01/2014     Admitting Physician: Marrian SalvageMohammad Sohail Chaudhry, MD     Discharge Physician: Colin BentonATHERINE C MURPHY, NP    Admission Diagnoses:   ABN TEST    Discharge Diagnoses:   Principal Problem:  Angina,  class II (HCC) ()    Active Problems:  DM (diabetes mellitus) (HCC) (01/12/2009)    HTN (hypertension) (01/12/2009)    Chronic kidney failure (01/12/2009)        Discharge Condition: Good    Cardiology Procedures this Admission:  Diagnostic left heart catheterization and Dialysis    Disposition: home    Reference discharge instructions provided by nursing for diet and activity.    Signed:  Colin Benton, NP  02/01/2014  3:50 PM      Radial Cardiac Catheterization/Angiography Discharge Instructions    It is normal to feel tired the first couple days. Take it easy and follow the physician???s instructions.     CHECK THE CATHETER INSERTION SITE DAILY:    Remove the wrist dressing 24 hours after the procedure.  You may shower 24 hours after the procedure. Wash with soap and water and pat dry.  Gentle cleaning of the site with soap and water is sufficient, cover with a dry clean dressing or bandage. Do not apply creams or powders to the area.  No soaking the wrist for 3 days.   Leave the puncture site open to air after 24 hours post-procedure.    CALL THE PHYSICIANS:     If the site becomes red, swollen or feels warm to the touch  If there is bleeding or drainage or if there is unusual pain at the radial site.   If there is any minor oozing, you may apply a band-aid and remove after 12 hours.   If the bleeding continues, hold pressure with the middle finger against the puncture site and the thumb against the back of the wrist,call 911 to be transported to the hospital.  DO NOT DRIVE YOURSELF, OR HAVE ANYONE ELSE DRIVE YOU ??? CALL 161.    ACTIVITY:   For the first 24 hours do not manipulate the wrist.  No lifting, pushing or pulling over 3-5 pounds with the affected wrist for 7 daysand no straining the insertion site. Do not life grocery bags or the garbage can, do not run the vacuum cleaner or lawn mower for 7 days.  Start with short walks as in the hospital and gradually increase as tolerated each day. It is recommended to walk 30 minutes 5-7 days per week.  Follow your physician???s instructions on activity. Avoid walking outside in extremes of heat or cold. Walk inside when it is cold and windy or hot and humid.     Things to keep in mind:  No driving for at least 24 hours, or as designated by your physician.  Limit the number of times you go up and down the stairs  Take rests and pace yourself with activity.  Be careful and do not strain with bowel movements.    MEDICATIONS:    Take all medications as prescribed  Call your physician if you have any questions  Keep an updated list of your medications with you at all times and give a list to your physician and pharmacist    SIGNS AND SYMPTOMS:   Be cautious of symptoms of angina or recurrent symptoms such as chest discomfort, unusual shortness of breath or fatigue. These could be symptoms of restenosis, a new blockage or a heart attack.   If your symptoms are relieved with rest it is still recommended that you  notify your physician of recurrent chest pain or discomfort.  For CHEST PAIN or symptoms of angina not relieved with rest: If the discomfort is not relieved  with rest, and you have been prescribed Nitroglycerin, take as directed (taken under the tongue, one at a time 5 minutes apart for a total of 3 doses). If the discomfort is not relieved after the 3rd nitroglycerin, call 911. If you have not been prescribed Nitroglycerin and your chest discomfort is not relieved with rest, call 911.     AFTER CARE:   Follow up with your physician as instructed.  Follow a heart healthy diet with proper portion control, daily stress management, daily exercise, blood pressure and cholesterol control , and smoking cessation    Performed medication reconciliation with pt and pt states that he has his medications.    Pt states that PCP is [facility] and nephrology is [facility].  Pt reports shoulder pain 7/10 and states that it is from fall.  Pt reports cardiac cath site is fine.    Pt reports no needs at this time.

## 2014-02-25 MED ORDER — ISOSORBIDE MONONITRATE SR 30 MG 24 HR TAB
30 mg | ORAL_TABLET | Freq: Every day | ORAL | Status: DC
Start: 2014-02-25 — End: 2015-03-02

## 2014-02-25 NOTE — Progress Notes (Signed)
NAME:  Karen Fuller   DOB:   21-Jul-1960   MRN:   [id number]   PCP:  Vaughan Browner Other, MD    ??????    Subjective:     The patient is a 53 y.o. year old female  who returns for a routine follow-up from PCI. Since the last visit, patient reports no change in exercise tolerance, chest pain, edema, medication intolerance, palpitations, shortness of breath, PND/orthopnea wheezing, sputum, syncope, dizziness or light headedness. Doing well. Feels her symptoms are much better on IMDUR.  No complaints.     Past Medical History   Diagnosis Date   ??? Reflux 01/12/2009   ??? DM (diabetes mellitus) (HCC) 01/12/2009   ??? HTN (hypertension) 01/12/2009   ??? Chronic kidney failure 01/12/2009   ??? Abdominal pain 01/12/2009   ??? Nausea & vomiting 01/12/2009   ??? Gastroparesis 01/12/2009   ??? GERD (gastroesophageal reflux disease)    ??? Renal disease    ??? Anemia NEC    ??? Musculoskeletal disorder    ??? Depression    ??? Calculus of kidney    ??? ESRD (end stage renal disease) (HCC) mwf   ??? Angina, class II (HCC)        History   Substance Use Topics   ??? Smoking status: Never Smoker    ??? Smokeless tobacco: Not on file   ??? Alcohol Use: No      Family History   Problem Relation Age of Onset   ??? Cancer Mother    ??? Diabetes Mother    ??? Hypertension Mother    ??? Stroke Father    ??? Diabetes Sister    ??? Hypertension Sister         Review of Systems  General: Pt denies excessive weight gain or loss. Pt is able to conduct ADL's.  Respiratory: Denies shortness of breath, DOE, wheezing or stridor.  Cardiovascular: Denies precordial pain, palpitations, edema or PND  Gastrointestinal: Denies poor appetite, indigestion, abdominal pain or blood in stool  Peripheral vascular: Denies claudication, leg cramps        Objective:       Filed Vitals:    02/25/14 1107   BP: 100/50   Pulse: 80   Resp: 16   Height:  (1.575 m)   Weight: 251 lb 1.6 oz (113.898 kg)    Body mass index is 45.92 kg/(m^2).      General??PE   Mental Status??- Alert. General Appearance??- Not in acute distress. Seated in a wheelchair.     Chest and Lung Exam??  Inspection:??Accessory muscles??- No use of accessory muscles in breathing.  Auscultation:??  Breath sounds:??- Normal.    Cardiovascular??  Inspection:??Jugular vein??- Bilateral??- Inspection Normal.  Palpation/Percussion:??  Apical Impulse:??- Normal.  Auscultation:??Rhythm??- Regular. Heart Sounds??- S1 WNL and S2 WNL. No S3 or S4.  Murmurs & Other Heart Sounds:??Auscultation of the heart reveals - No Murmurs.    Peripheral Vascular??  Upper Extremity:??Inspection??- Bilateral??- No Cyanotic nailbeds or Digital clubbing.  Lower Extremity:??  Palpation:??Edema??- Bilateral??- No edema.        Data Review:     EKG -  Sinus rhythm.     Cardiac Cath -- CARDIAC STRUCTURES:  -- Global left ventricular function was normal.    -- CORONARY CIRCULATION:  -- Ostial LAD: There was a diffuse 50 % stenosis.  -- 1st diagonal: The vessel was small to medium sized. There was a  diffuse 80 % stenosis in the proximal third of the vessel  segment.    -- 1ST LESION INTERVENTIONS:  -- A percutaneous intervention was performed on the 50 % lesion in the  ostial LAD. -- Myocardial Fractional Flow Reserve (FFR) measurement was  performed using a 0.014" pressure-monitoring Wire Sens Prss AERIS 175cm  guide-wire. Steady baseline values were obtained. Mean arterial pressure  and mean distal coronary pressures were then obtained at maximum  hyperemia. FFR was calculated to be 0.88. Based on the results of this  study, the lesion was judged to be non-significant and no intervention was  performed.    RECOMMENDATIONS:  Due to size of native D1 and location of D1 lesion, will manage medically  for now. If symptoms does not resolve, then can consider PCI in future.      Allergies reviewed  Allergies   Allergen Reactions   ??? Bee Venom (Honey Bee) Anaphylaxis   ??? Lortab [Hydrocodone-Acetaminophen] Other (comments)     Stopped Breathing      ??? Tylenol [Acetaminophen] Other (comments)     Stopped Breathing   ??? Ambien [Zolpidem] Other (comments)     Hallucinations   ??? Hydromorphone Shortness of Breath   ??? Xanax [Alprazolam] Other (comments)     hallucinations       Medications reviewed  Current Outpatient Prescriptions   Medication Sig   ??? clopidogrel (PLAVIX) 75 mg tablet Take  by mouth daily.   ??? b complex-vitamin c-folic acid 0.8 mg (NEPHRO-VITE) 0.8 mg tab tablet Take 1 Tab by mouth daily.   ??? famotidine (PEPCID) 20 mg tablet Take 20 mg by mouth two (2) times a day.   ??? fenofibrate nanocrystallized (TRICOR) 48 mg tablet Take  by mouth daily.   ??? cinacalcet (SENSIPAR) 30 mg tablet Take 30 mg by mouth daily.   ??? lisinopril (PRINIVIL, ZESTRIL) 20 mg tablet Take  by mouth daily.   ??? traMADol (ULTRAM) 50 mg tablet Take 50 mg by mouth every six (6) hours as needed for Pain.   ??? hydrALAZINE (APRESOLINE) 50 mg tablet Take 25 mg by mouth four (4) times daily.   ??? sevelamer carbonate (RENVELA) 800 mg tab tab Take  by mouth three (3) times daily.   ??? pravastatin (PRAVACHOL) 20 mg tablet Take 20 mg by mouth daily.     ??? insulin lispro (HUMALOG) 100 unit/mL injection 15 Units by SubCUTAneous route once. With Each Meal    ??? aspirin 81 mg chewable tablet Take 81 mg by mouth daily.   ??? calcium acetate (PHOSLO) 667 mg Cap Take  by mouth three (3) times daily (with meals).   ??? pregabalin (LYRICA) 75 mg capsule Take 75 mg by mouth two (2) times a day.   ??? quetiapine (SEROQUEL) 50 mg tablet Take 150 mg by mouth daily.   ??? amlodipine (NORVASC) 10 mg tablet Take  by mouth daily.   ??? carvedilol (COREG) 25 mg tablet Take 25 mg by mouth two (2) times daily (with meals).   ??? insulin glargine (LANTUS) 100 unit/mL injection 25 Units by SubCUTAneous route. At Bedtime     ??? isosorbide mononitrate ER (IMDUR) 30 mg tablet Take 1 Tab by mouth daily.     No current facility-administered medications for this visit.         Assessment:       ICD-9-CM ICD-10-CM     1. Atherosclerosis of native coronary artery without angina pectoris 414.01 I25.10    2. Chronic kidney failure, unspecified stage 585.9 N18.9    3. Systolic murmur 785.2  R01.1    4. Essential hypertension 401.9 I10 AMB POC EKG ROUTINE W/ 12 LEADS, INTER & REP        Orders Placed This Encounter   ??? AMB POC EKG ROUTINE W/ 12 LEADS, INTER & REP     Order Specific Question:  Reason for Exam:     Answer:  routine   ??? clopidogrel (PLAVIX) 75 mg tablet     Sig: Take  by mouth daily.   ??? b complex-vitamin c-folic acid 0.8 mg (NEPHRO-VITE) 0.8 mg tab tablet     Sig: Take 1 Tab by mouth daily.       Patient Active Problem List   Diagnosis Code   ??? Reflux 530.81   ??? DM (diabetes mellitus) (HCC) 250.00   ??? HTN (hypertension) 401.9   ??? Chronic kidney failure 585.9   ??? Abdominal pain 789.00   ??? Nausea & vomiting 787.01   ??? Gastroparesis 536.3   ??? Gastroparesis 536.3   ??? Chronic kidney failure 585.9   ??? Nausea & vomiting 787.01   ??? Systolic murmur 785.2   ??? Angina, class II (HCC) 413.9       Plan:     Patient presents for follow up, feeling well and stable from cardiac standpoint. Follow up in 3 mo.     ASHD -negative FFR to ostial LAD.  Symptoms improved on IMDUR. Will continue.     BENIGN ESSENTIAL HYPERTENSION   Well controlled. Continue current meds.     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   Cherelle Midkiff Chales Abrahams, ANP

## 2014-02-25 NOTE — Progress Notes (Signed)
Chief Complaint   Patient presents with   ??? Other     hospital follow up,no cardiac symptoms.

## 2014-02-27 ENCOUNTER — Inpatient Hospital Stay (HOSPITAL_COMMUNITY)
Admission: EM | Admit: 2014-02-27 | Discharge: 2014-03-04 | DRG: 871 | Disposition: A | Discharge status: Home / Self Care | Payer: Medicare Other | Attending: Family Medicine | Admitting: Family Medicine

## 2014-02-27 ENCOUNTER — Emergency Department (HOSPITAL_COMMUNITY): Payer: Medicare Other

## 2014-02-27 ENCOUNTER — Encounter (HOSPITAL_COMMUNITY): Payer: Self-pay | Admitting: Emergency Medicine

## 2014-02-27 ENCOUNTER — Inpatient Hospital Stay (HOSPITAL_COMMUNITY): Payer: Medicare Other

## 2014-02-27 DIAGNOSIS — H510 Palsy (spasm) of conjugate gaze: Secondary | ICD-10-CM

## 2014-02-27 DIAGNOSIS — E872 Acidosis, unspecified: Secondary | ICD-10-CM | POA: Diagnosis present

## 2014-02-27 DIAGNOSIS — E1149 Type 2 diabetes mellitus with other diabetic neurological complication: Secondary | ICD-10-CM | POA: Diagnosis present

## 2014-02-27 DIAGNOSIS — N186 End stage renal disease: Secondary | ICD-10-CM | POA: Diagnosis present

## 2014-02-27 DIAGNOSIS — R0902 Hypoxemia: Secondary | ICD-10-CM | POA: Diagnosis present

## 2014-02-27 DIAGNOSIS — E1169 Type 2 diabetes mellitus with other specified complication: Secondary | ICD-10-CM | POA: Diagnosis present

## 2014-02-27 DIAGNOSIS — G4733 Obstructive sleep apnea (adult) (pediatric): Secondary | ICD-10-CM | POA: Diagnosis present

## 2014-02-27 DIAGNOSIS — R079 Chest pain, unspecified: Secondary | ICD-10-CM | POA: Diagnosis present

## 2014-02-27 DIAGNOSIS — J9600 Acute respiratory failure, unspecified whether with hypoxia or hypercapnia: Secondary | ICD-10-CM | POA: Diagnosis present

## 2014-02-27 DIAGNOSIS — K219 Gastro-esophageal reflux disease without esophagitis: Secondary | ICD-10-CM | POA: Diagnosis present

## 2014-02-27 DIAGNOSIS — I120 Hypertensive chronic kidney disease with stage 5 chronic kidney disease or end stage renal disease: Secondary | ICD-10-CM | POA: Diagnosis present

## 2014-02-27 DIAGNOSIS — E213 Hyperparathyroidism, unspecified: Secondary | ICD-10-CM | POA: Diagnosis present

## 2014-02-27 DIAGNOSIS — Z992 Dependence on renal dialysis: Secondary | ICD-10-CM

## 2014-02-27 DIAGNOSIS — R0602 Shortness of breath: Secondary | ICD-10-CM | POA: Diagnosis present

## 2014-02-27 DIAGNOSIS — J189 Pneumonia, unspecified organism: Secondary | ICD-10-CM

## 2014-02-27 DIAGNOSIS — IMO0001 Reserved for inherently not codable concepts without codable children: Secondary | ICD-10-CM

## 2014-02-27 DIAGNOSIS — Z7982 Long term (current) use of aspirin: Secondary | ICD-10-CM

## 2014-02-27 DIAGNOSIS — Z993 Dependence on wheelchair: Secondary | ICD-10-CM

## 2014-02-27 DIAGNOSIS — Z8249 Family history of ischemic heart disease and other diseases of the circulatory system: Secondary | ICD-10-CM

## 2014-02-27 DIAGNOSIS — Z833 Family history of diabetes mellitus: Secondary | ICD-10-CM | POA: Diagnosis not present

## 2014-02-27 DIAGNOSIS — E1165 Type 2 diabetes mellitus with hyperglycemia: Secondary | ICD-10-CM

## 2014-02-27 DIAGNOSIS — E1122 Type 2 diabetes mellitus with diabetic chronic kidney disease: Secondary | ICD-10-CM | POA: Diagnosis present

## 2014-02-27 DIAGNOSIS — Z7902 Long term (current) use of antithrombotics/antiplatelets: Secondary | ICD-10-CM

## 2014-02-27 DIAGNOSIS — Z823 Family history of stroke: Secondary | ICD-10-CM | POA: Diagnosis not present

## 2014-02-27 DIAGNOSIS — R0789 Other chest pain: Secondary | ICD-10-CM

## 2014-02-27 DIAGNOSIS — E662 Morbid (severe) obesity with alveolar hypoventilation: Secondary | ICD-10-CM | POA: Diagnosis present

## 2014-02-27 DIAGNOSIS — J9601 Acute respiratory failure with hypoxia: Secondary | ICD-10-CM

## 2014-02-27 DIAGNOSIS — Z794 Long term (current) use of insulin: Secondary | ICD-10-CM | POA: Diagnosis not present

## 2014-02-27 DIAGNOSIS — D638 Anemia in other chronic diseases classified elsewhere: Secondary | ICD-10-CM | POA: Diagnosis present

## 2014-02-27 DIAGNOSIS — G9340 Encephalopathy, unspecified: Secondary | ICD-10-CM | POA: Diagnosis present

## 2014-02-27 DIAGNOSIS — Z950 Presence of cardiac pacemaker: Secondary | ICD-10-CM | POA: Diagnosis not present

## 2014-02-27 DIAGNOSIS — K3184 Gastroparesis: Secondary | ICD-10-CM

## 2014-02-27 DIAGNOSIS — I10 Essential (primary) hypertension: Secondary | ICD-10-CM

## 2014-02-27 DIAGNOSIS — IMO0002 Reserved for concepts with insufficient information to code with codable children: Secondary | ICD-10-CM | POA: Diagnosis present

## 2014-02-27 DIAGNOSIS — H518 Other specified disorders of binocular movement: Secondary | ICD-10-CM

## 2014-02-27 DIAGNOSIS — A419 Sepsis, unspecified organism: Secondary | ICD-10-CM | POA: Diagnosis present

## 2014-02-27 DIAGNOSIS — H9190 Unspecified hearing loss, unspecified ear: Secondary | ICD-10-CM | POA: Diagnosis present

## 2014-02-27 LAB — BLOOD GAS, ARTERIAL
Acid-Base Excess: 4.5 mmol/L — ABNORMAL HIGH (ref 0.0–2.0)
Bicarbonate: 30.1 mEq/L — ABNORMAL HIGH (ref 20.0–24.0)
DRAWN BY: 23534
O2 CONTENT: 3 L/min
O2 Saturation: 95.7 %
PCO2 ART: 59.4 mmHg — AB (ref 35.0–45.0)
Patient temperature: 37
TCO2: 28.3 mmol/L (ref 0–100)
pH, Arterial: 7.325 — ABNORMAL LOW (ref 7.350–7.450)
pO2, Arterial: 87.3 mmHg (ref 80.0–100.0)

## 2014-02-27 LAB — TROPONIN I

## 2014-02-27 LAB — CBC WITH DIFFERENTIAL/PLATELET
BASOS PCT: 0 % (ref 0–1)
Basophils Absolute: 0 10*3/uL (ref 0.0–0.1)
EOS PCT: 2 % (ref 0–5)
Eosinophils Absolute: 0.2 10*3/uL (ref 0.0–0.7)
HEMATOCRIT: 33.1 % — AB (ref 36.0–46.0)
Hemoglobin: 10.6 g/dL — ABNORMAL LOW (ref 12.0–15.0)
Lymphocytes Relative: 17 % (ref 12–46)
Lymphs Abs: 1.7 10*3/uL (ref 0.7–4.0)
MCH: 30.6 pg (ref 26.0–34.0)
MCHC: 32 g/dL (ref 30.0–36.0)
MCV: 95.7 fL (ref 78.0–100.0)
MONO ABS: 0.4 10*3/uL (ref 0.1–1.0)
Monocytes Relative: 4 % (ref 3–12)
Neutro Abs: 7.4 10*3/uL (ref 1.7–7.7)
Neutrophils Relative %: 77 % (ref 43–77)
Platelets: 197 10*3/uL (ref 150–400)
RBC: 3.46 MIL/uL — ABNORMAL LOW (ref 3.87–5.11)
RDW: 14.6 % (ref 11.5–15.5)
WBC: 9.8 10*3/uL (ref 4.0–10.5)

## 2014-02-27 LAB — CBC
HEMATOCRIT: 31.1 % — AB (ref 36.0–46.0)
Hemoglobin: 10.1 g/dL — ABNORMAL LOW (ref 12.0–15.0)
MCH: 31.2 pg (ref 26.0–34.0)
MCHC: 32.5 g/dL (ref 30.0–36.0)
MCV: 96 fL (ref 78.0–100.0)
Platelets: 188 10*3/uL (ref 150–400)
RBC: 3.24 MIL/uL — AB (ref 3.87–5.11)
RDW: 14.7 % (ref 11.5–15.5)
WBC: 10 10*3/uL (ref 4.0–10.5)

## 2014-02-27 LAB — PROCALCITONIN: Procalcitonin: 0.73 ng/mL

## 2014-02-27 LAB — CREATININE, SERUM
Creatinine, Ser: 6.65 mg/dL — ABNORMAL HIGH (ref 0.50–1.10)
GFR calc non Af Amer: 6 mL/min — ABNORMAL LOW (ref >90)
GFR, EST AFRICAN AMERICAN: 8 mL/min — AB (ref >90)

## 2014-02-27 LAB — GLUCOSE, CAPILLARY: GLUCOSE-CAPILLARY: 213 mg/dL — AB (ref 70–99)

## 2014-02-27 LAB — COMPREHENSIVE METABOLIC PANEL
ALBUMIN: 3.8 g/dL (ref 3.5–5.2)
ALT: 20 U/L (ref 0–35)
ANION GAP: 14 (ref 5–15)
AST: 31 U/L (ref 0–37)
Alkaline Phosphatase: 109 U/L (ref 39–117)
BUN: 27 mg/dL — ABNORMAL HIGH (ref 6–23)
CALCIUM: 8.5 mg/dL (ref 8.4–10.5)
CO2: 30 mEq/L (ref 19–32)
CREATININE: 6.46 mg/dL — AB (ref 0.50–1.10)
Chloride: 93 mEq/L — ABNORMAL LOW (ref 96–112)
GFR calc Af Amer: 8 mL/min — ABNORMAL LOW (ref >90)
GFR, EST NON AFRICAN AMERICAN: 7 mL/min — AB (ref >90)
Glucose, Bld: 221 mg/dL — ABNORMAL HIGH (ref 70–99)
Potassium: 4.3 mEq/L (ref 3.7–5.3)
Sodium: 137 mEq/L (ref 137–147)
Total Bilirubin: 0.6 mg/dL (ref 0.3–1.2)
Total Protein: 8.3 g/dL (ref 6.0–8.3)

## 2014-02-27 LAB — I-STAT CG4 LACTIC ACID, ED: Lactic Acid, Venous: 0.79 mmol/L (ref 0.5–2.2)

## 2014-02-27 LAB — AMMONIA: Ammonia: 27 umol/L (ref 11–60)

## 2014-02-27 LAB — PRO B NATRIURETIC PEPTIDE: Pro B Natriuretic peptide (BNP): 429.8 pg/mL — ABNORMAL HIGH (ref 0–125)

## 2014-02-27 MED ORDER — TRAMADOL HCL 50 MG PO TABS
50.0000 mg | ORAL_TABLET | Freq: Every day | ORAL | Status: DC | PRN
  Administered 2014-02-27 – 2014-03-01 (×3): 50 mg via ORAL
  Filled 2014-02-27 (×3): qty 1

## 2014-02-27 MED ORDER — DEXTROSE 5 % IV SOLN
2.0000 g | Freq: Once | INTRAVENOUS | Status: AC
  Administered 2014-02-27: 2 g via INTRAVENOUS
  Filled 2014-02-27: qty 2

## 2014-02-27 MED ORDER — CARVEDILOL 12.5 MG PO TABS
25.0000 mg | ORAL_TABLET | Freq: Two times a day (BID) | ORAL | Status: DC
  Administered 2014-02-28 – 2014-03-04 (×9): 25 mg via ORAL
  Filled 2014-02-27 (×2): qty 2
  Filled 2014-02-27: qty 8
  Filled 2014-02-27 (×6): qty 2

## 2014-02-27 MED ORDER — SODIUM CHLORIDE 0.9 % IJ SOLN
3.0000 mL | Freq: Two times a day (BID) | INTRAMUSCULAR | Status: DC
  Administered 2014-02-27 – 2014-03-04 (×6): 3 mL via INTRAVENOUS

## 2014-02-27 MED ORDER — VANCOMYCIN HCL IN DEXTROSE 1-5 GM/200ML-% IV SOLN
INTRAVENOUS | Status: AC
  Filled 2014-02-27: qty 400

## 2014-02-27 MED ORDER — SODIUM CHLORIDE 0.9 % IJ SOLN
INTRAMUSCULAR | Status: AC
  Filled 2014-02-27: qty 21

## 2014-02-27 MED ORDER — DEXTROSE 5 % IV SOLN
INTRAVENOUS | Status: AC
  Filled 2014-02-27: qty 2

## 2014-02-27 MED ORDER — SODIUM CHLORIDE 0.9 % IV SOLN
1000.0000 mL | INTRAVENOUS | Status: DC
  Administered 2014-02-27: 1000 mL via INTRAVENOUS

## 2014-02-27 MED ORDER — VANCOMYCIN HCL 10 G IV SOLR
2000.0000 mg | Freq: Once | INTRAVENOUS | Status: AC
  Administered 2014-02-27: 2000 mg via INTRAVENOUS
  Filled 2014-02-27: qty 2000

## 2014-02-27 MED ORDER — SEVELAMER CARBONATE 800 MG PO TABS
800.0000 mg | ORAL_TABLET | Freq: Three times a day (TID) | ORAL | Status: DC
  Administered 2014-02-28 – 2014-03-04 (×12): 800 mg via ORAL
  Filled 2014-02-27 (×13): qty 1

## 2014-02-27 MED ORDER — ISOSORBIDE MONONITRATE ER 60 MG PO TB24
30.0000 mg | ORAL_TABLET | Freq: Every day | ORAL | Status: DC
  Administered 2014-02-27 – 2014-03-04 (×6): 30 mg via ORAL
  Filled 2014-02-27 (×6): qty 1

## 2014-02-27 MED ORDER — SIMVASTATIN 10 MG PO TABS
10.0000 mg | ORAL_TABLET | Freq: Every day | ORAL | Status: DC
  Administered 2014-02-28 – 2014-03-04 (×5): 10 mg via ORAL
  Filled 2014-02-27 (×5): qty 1

## 2014-02-27 MED ORDER — DEXTROSE 5 % IV SOLN
1.0000 g | Freq: Once | INTRAVENOUS | Status: AC
  Administered 2014-02-27: 1 g via INTRAVENOUS
  Filled 2014-02-27: qty 10

## 2014-02-27 MED ORDER — INSULIN ASPART 100 UNIT/ML ~~LOC~~ SOLN
0.0000 [IU] | Freq: Every day | SUBCUTANEOUS | Status: DC
  Administered 2014-02-27 – 2014-03-01 (×2): 2 [IU] via SUBCUTANEOUS
  Administered 2014-03-02: 3 [IU] via SUBCUTANEOUS

## 2014-02-27 MED ORDER — CINACALCET HCL 30 MG PO TABS
30.0000 mg | ORAL_TABLET | Freq: Every evening | ORAL | Status: DC
  Administered 2014-02-28 – 2014-03-03 (×3): 30 mg via ORAL
  Filled 2014-02-27 (×6): qty 1

## 2014-02-27 MED ORDER — SODIUM CHLORIDE 0.9 % IJ SOLN
INTRAMUSCULAR | Status: AC
  Filled 2014-02-27: qty 350

## 2014-02-27 MED ORDER — INSULIN ASPART 100 UNIT/ML ~~LOC~~ SOLN
0.0000 [IU] | Freq: Three times a day (TID) | SUBCUTANEOUS | Status: DC
  Administered 2014-02-28: 5 [IU] via SUBCUTANEOUS
  Administered 2014-02-28 (×2): 3 [IU] via SUBCUTANEOUS
  Administered 2014-03-01 (×2): 5 [IU] via SUBCUTANEOUS
  Administered 2014-03-02 – 2014-03-03 (×2): 3 [IU] via SUBCUTANEOUS
  Administered 2014-03-03: 5 [IU] via SUBCUTANEOUS
  Administered 2014-03-03 – 2014-03-04 (×2): 3 [IU] via SUBCUTANEOUS
  Administered 2014-03-04: 5 [IU] via SUBCUTANEOUS

## 2014-02-27 MED ORDER — SODIUM CHLORIDE 0.9 % IV SOLN: 250.0000 mL | INTRAVENOUS | Status: DC | PRN

## 2014-02-27 MED ORDER — CALCIUM ACETATE 667 MG PO CAPS
667.0000 mg | ORAL_CAPSULE | Freq: Three times a day (TID) | ORAL | Status: DC
  Administered 2014-02-27 – 2014-03-04 (×13): 667 mg via ORAL
  Filled 2014-02-27 (×14): qty 1

## 2014-02-27 MED ORDER — SODIUM CHLORIDE 0.9 % IJ SOLN: 3.0000 mL | INTRAMUSCULAR | Status: DC | PRN

## 2014-02-27 MED ORDER — QUETIAPINE FUMARATE ER 50 MG PO TB24
150.0000 mg | ORAL_TABLET | Freq: Every day | ORAL | Status: DC
  Administered 2014-02-28 – 2014-03-03 (×4): 150 mg via ORAL
  Filled 2014-02-27 (×5): qty 3

## 2014-02-27 MED ORDER — FENOFIBRATE 54 MG PO TABS
54.0000 mg | ORAL_TABLET | Freq: Every day | ORAL | Status: DC
  Administered 2014-02-27 – 2014-03-04 (×6): 54 mg via ORAL
  Filled 2014-02-27 (×7): qty 1

## 2014-02-27 MED ORDER — CLOPIDOGREL BISULFATE 75 MG PO TABS
75.0000 mg | ORAL_TABLET | Freq: Every day | ORAL | Status: DC
  Administered 2014-02-27 – 2014-03-04 (×6): 75 mg via ORAL
  Filled 2014-02-27 (×6): qty 1

## 2014-02-27 MED ORDER — DEXTROSE 5 % IV SOLN
500.0000 mg | INTRAVENOUS | Status: DC
Start: 2014-02-28 — End: 2014-02-28
  Filled 2014-02-27 (×2): qty 500

## 2014-02-27 MED ORDER — PREGABALIN 75 MG PO CAPS
75.0000 mg | ORAL_CAPSULE | Freq: Every day | ORAL | Status: DC
  Administered 2014-02-28 – 2014-03-04 (×5): 75 mg via ORAL
  Filled 2014-02-27 (×5): qty 1

## 2014-02-27 MED ORDER — FENOFIBRATE 54 MG PO TABS
ORAL_TABLET | ORAL | Status: AC
  Filled 2014-02-27: qty 1

## 2014-02-27 MED ORDER — DEXTROSE 5 % IV SOLN
1.0000 g | Freq: Three times a day (TID) | INTRAVENOUS | Status: DC
  Filled 2014-02-27 (×6): qty 1

## 2014-02-27 MED ORDER — ENOXAPARIN SODIUM 30 MG/0.3ML ~~LOC~~ SOLN
30.0000 mg | SUBCUTANEOUS | Status: DC
  Administered 2014-02-27 – 2014-03-03 (×5): 30 mg via SUBCUTANEOUS
  Filled 2014-02-27 (×5): qty 0.3

## 2014-02-27 MED ORDER — IOHEXOL 350 MG/ML SOLN
100.0000 mL | Freq: Once | INTRAVENOUS | Status: AC | PRN
  Administered 2014-02-27: 100 mL via INTRAVENOUS

## 2014-02-27 MED ORDER — DEXTROSE 5 % IV SOLN
500.0000 mg | Freq: Once | INTRAVENOUS | Status: AC
  Administered 2014-02-27: 500 mg via INTRAVENOUS
  Filled 2014-02-27: qty 500

## 2014-02-27 MED ORDER — FAMOTIDINE 20 MG PO TABS
20.0000 mg | ORAL_TABLET | Freq: Two times a day (BID) | ORAL | Status: DC
  Administered 2014-02-27 – 2014-02-28 (×2): 20 mg via ORAL
  Filled 2014-02-27 (×2): qty 1

## 2014-02-27 MED ORDER — INSULIN GLARGINE 100 UNIT/ML ~~LOC~~ SOLN
20.0000 [IU] | Freq: Every day | SUBCUTANEOUS | Status: DC
  Administered 2014-02-27 – 2014-03-03 (×5): 20 [IU] via SUBCUTANEOUS
  Filled 2014-02-27 (×6): qty 0.2

## 2014-02-27 MED ORDER — DEXTROSE 5 % IV SOLN
1.0000 g | INTRAVENOUS | Status: DC
  Filled 2014-02-27 (×2): qty 10

## 2014-02-27 MED ORDER — ASPIRIN EC 81 MG PO TBEC
81.0000 mg | DELAYED_RELEASE_TABLET | Freq: Every morning | ORAL | Status: DC
  Administered 2014-02-28 – 2014-03-04 (×5): 81 mg via ORAL
  Filled 2014-02-27 (×5): qty 1

## 2014-02-27 MED ORDER — DEXTROSE 5 % IV SOLN
INTRAVENOUS | Status: AC
  Filled 2014-02-27 (×2): qty 1

## 2014-02-27 NOTE — Progress Notes (Signed)
ANTIBIOTIC CONSULT NOTE - INITIAL  Pharmacy Consult for Zithromax and Rocephin Indication: pneumonia  Allergies  Allergen Reactions  . Dilaudid [Hydromorphone Hcl] Anaphylaxis  . Hydrocodone-Acetaminophen Anaphylaxis  . Tylenol [Acetaminophen] Anaphylaxis  . Ace Inhibitors   . Alprazolam Other (See Comments)    Hallucination  . Bee Venom Swelling  . Zolpidem Tartrate Other (See Comments)    Hallucinations    Patient Measurements:    Vital Signs: Temp: 100 F (37.8 C) (09/12 1341) Temp src: Oral (09/12 1341) BP: 126/59 mmHg (09/12 1341) Pulse Rate: 105 (09/12 1341) Intake/Output from previous day:   Intake/Output from this shift:    Labs: No results found for this basename: WBC, HGB, PLT, LABCREA, CREATININE,  in the last 72 hours The CrCl is unknown because both a height and weight (above a minimum accepted value) are required for this calculation. No results found for this basename: VANCOTROUGH, VANCOPEAK, VANCORANDOM, GENTTROUGH, GENTPEAK, GENTRANDOM, TOBRATROUGH, TOBRAPEAK, TOBRARND, AMIKACINPEAK, AMIKACINTROU, AMIKACIN,  in the last 72 hours   Microbiology: No results found for this or any previous visit (from the past 720 hour(s)).  Medical History: Past Medical History  Diagnosis Date  . Diabetes mellitus   . Osteomyelitis 2009  . Hypertension   . ESRD on hemodialysis   . Gastroparesis   . Depression   . 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  Asked to initiate Rocephin and Zithromax.  Goal of Therapy:  Eradicate infection.  Plan:  Zithromax 500mg  IV  q24hrs Switch to PO when improved Rocephin 1gm IV q24hrs Monitor labs, progress, and cultures  Hart Robinsons A 02/27/2014,2:46 PM

## 2014-02-27 NOTE — ED Notes (Signed)
No dyspnea noted in triage. nad noted. Pt denies any sob.

## 2014-02-27 NOTE — ED Provider Notes (Signed)
CSN: [id number]     Arrival date & time 02/27/14  1325 History   First MD Initiated Contact with Patient 02/27/14 1358    This chart was scribed for Orpah Greek, * by Evelene Croon, ED Scribe. This patient was seen in room APA09/APA09 and the patient's care was started 4:32 PM.  Chief Complaint  Patient presents with  . Abdominal Pain      The history is provided by the patient and the spouse.   HPI Comments:  Kayla Gibson is a 53 y.o. female who presents to the Emergency Department complaining of trouble hearing that has been constant since this am, states everything sounds like water. Pt also c/o moderate constant pain to her right rib area that started three days ago. Pt  Denies injury/fall. Pt also denies cough, vomiting, and diarrhea. No alleviating factors noted.         Past Medical History  Diagnosis Date  . Diabetes mellitus   . Osteomyelitis 2009  . Hypertension   . ESRD on hemodialysis   . Gastroparesis   . Depression   . 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 Back surgery    . Cholecystectomy    . Av fistula placement      left arm  . Pacemaker insertion      for gastroparesis  . Colonoscopy  11/06/2011    Procedure: COLONOSCOPY;  Surgeon: Rogene Houston, MD;  Location: [facility];  Service: Endoscopy;  Laterality: N/A;   Family History  Problem Relation Age of Onset  . Cancer Mother 40    stomach  . Diabetes Mother   . Stroke Father 57  . Heart failure Father   . Diabetes Sister   . Hypertension Sister    History  Substance Use Topics  . Smoking status: Never Smoker   . Smokeless tobacco: Not on file  . Alcohol Use: No   OB History   Grav Para Term Preterm Abortions TAB SAB Ect Mult Living                 Review of Systems  HENT: Positive for hearing loss.   Gastrointestinal: Negative for vomiting and diarrhea.  Musculoskeletal:       Rib pain   All other systems reviewed  and are negative.     Allergies  Dilaudid; Hydrocodone-acetaminophen; Tylenol; Ace inhibitors; Alprazolam; Bee venom; and Zolpidem tartrate  Home Medications   Prior to Admission medications   Medication Sig Start Date End Date Taking? Authorizing Provider  amLODipine (NORVASC) 10 MG tablet Take 10 mg by mouth daily.   Yes Historical Provider, MD  aspirin EC 81 MG tablet Take 81 mg by mouth every morning.    Yes Historical Provider, MD  calcium acetate (PHOSLO) 667 MG capsule Take 667 mg by mouth 3 (three) times daily.     Yes Historical Provider, MD  carvedilol (COREG) 25 MG tablet Take 25 mg by mouth 2 (two) times daily with a meal.     Yes Historical Provider, MD  cinacalcet (SENSIPAR) 30 MG tablet Take 30 mg by mouth every evening.     Yes Historical Provider, MD  clopidogrel (PLAVIX) 75 MG tablet Take 75 mg by mouth daily.   Yes Historical Provider, MD  famotidine (PEPCID) 20 MG tablet Take 20 mg by mouth 2 (two) times daily.   Yes Historical Provider, MD  fenofibrate (TRICOR) 48 MG tablet Take 48 mg by mouth  daily.   Yes Historical Provider, MD  hydrALAZINE (APRESOLINE) 50 MG tablet Take 50 mg by mouth 4 (four) times daily.   Yes Historical Provider, MD  insulin glargine (LANTUS) 100 UNIT/ML injection Inject 25 Units into the skin at bedtime.     Yes Historical Provider, MD  insulin lispro (HUMALOG) 100 UNIT/ML injection Inject 15 Units into the skin 3 (three) times daily before meals.     Yes Historical Provider, MD  isosorbide mononitrate (IMDUR) 30 MG 24 hr tablet Take 30 mg by mouth daily.   Yes Historical Provider, MD  lisinopril (PRINIVIL,ZESTRIL) 20 MG tablet Take 20 mg by mouth 2 (two) times daily.   Yes Historical Provider, MD  pravastatin (PRAVACHOL) 20 MG tablet Take 20 mg by mouth every morning.    Yes Historical Provider, MD  pregabalin (LYRICA) 75 MG capsule Take 75 mg by mouth daily.    Yes Historical Provider, MD  QUEtiapine Fumarate (SEROQUEL XR) 150 MG 24 hr tablet  Take 150 mg by mouth at bedtime.     Yes Historical Provider, MD  sevelamer (RENVELA) 800 MG tablet Take 800 mg by mouth 3 (three) times daily with meals.     Yes Historical Provider, MD  sucroferric oxyhydroxide (VELPHORO) 500 MG chewable tablet Chew 500 mg by mouth 3 (three) times daily with meals.   Yes Historical Provider, MD  traMADol (ULTRAM) 50 MG tablet Take 50 mg by mouth daily as needed. For back  pain    Yes Historical Provider, MD  promethazine (PHENERGAN) 25 MG suppository Place 1 suppository (25 mg total) rectally every 6 (six) hours as needed for nausea. 11/20/11 05/08/12  Prentiss Bells, MD   BP 104/67  Pulse 100  Temp(Src) 97.4 F (36.3 C) (Oral)  Resp 14  SpO2 99% Physical Exam  Nursing note and vitals reviewed. Constitutional: She appears well-developed and well-nourished. No distress.  HENT:  Head: Normocephalic and atraumatic.  Right Ear: Hearing normal.  Left Ear: Hearing normal.  Nose: Nose normal.  Mouth/Throat: Oropharynx is clear and moist and mucous membranes are normal.  Eyes: Conjunctivae and EOM are normal. Pupils are equal, round, and reactive to light.  Neck: Normal range of motion. Neck supple.  Cardiovascular: Regular rhythm, S1 normal and S2 normal.  Exam reveals no gallop and no friction rub.   No murmur heard. Pulmonary/Chest: Effort normal and breath sounds normal. No respiratory distress. She exhibits no tenderness.  Abdominal: Soft. Normal appearance and bowel sounds are normal. There is no hepatosplenomegaly. There is no tenderness. There is no rebound, no guarding, no tenderness at McBurney's point and negative Murphy's sign. No hernia.  Musculoskeletal: Normal range of motion.  Tenderness over right rib area    Neurological: She has normal strength. No cranial nerve deficit or sensory deficit. Coordination normal. GCS eye subscore is 4. GCS verbal subscore is 5. GCS motor subscore is 6.  Somnolent   Skin: Skin is warm, dry and intact. No rash  noted. No cyanosis.  Psychiatric: She has a normal mood and affect. Her speech is normal and behavior is normal. Thought content normal.    ED Course  Procedures (including critical care time)  Angiocath insertion Performed by: Aariv Medlock J.  Consent: Verbal consent obtained. Risks and benefits: risks, benefits and alternatives were discussed Time out: Immediately prior to procedure a "time out" was called to verify the correct patient, procedure, equipment, support staff and site/side marked as required.  Preparation: Patient was prepped and draped in the  usual sterile fashion.  Vein Location: R antecub  Ultrasound Guided  Gauge: 20G  Normal blood return and flush without difficulty Patient tolerance: Patient tolerated the procedure well with no immediate complications.      DIAGNOSTIC STUDIES:  Oxygen Saturation is 80% on RA, low by my interpretation.    COORDINATION OF CARE:  4:32 PM Discussed treatment plan with pt at bedside and pt agreed to plan.  Labs Review Labs Reviewed  CBC WITH DIFFERENTIAL - Abnormal; Notable for the following:    RBC 3.46 (*)    Hemoglobin 10.6 (*)    HCT 33.1 (*)    All other components within normal limits  COMPREHENSIVE METABOLIC PANEL - Abnormal; Notable for the following:    Chloride 93 (*)    Glucose, Bld 221 (*)    BUN 27 (*)    Creatinine, Ser 6.46 (*)    GFR calc non Af Amer 7 (*)    GFR calc Af Amer 8 (*)    All other components within normal limits  BLOOD GAS, ARTERIAL - Abnormal; Notable for the following:    pH, Arterial 7.325 (*)    pCO2 arterial 59.4 (*)    Bicarbonate 30.1 (*)    Acid-Base Excess 4.5 (*)    All other components within normal limits  PRO B NATRIURETIC PEPTIDE - Abnormal; Notable for the following:    Pro B Natriuretic peptide (BNP) 429.8 (*)    All other components within normal limits  CULTURE, BLOOD (ROUTINE X 2)  CULTURE, BLOOD (ROUTINE X 2)  URINE CULTURE  TROPONIN I   URINALYSIS, ROUTINE W REFLEX MICROSCOPIC  I-STAT CG4 LACTIC ACID, ED    Imaging Review Dg Chest Port 1 View  02/27/2014   CLINICAL DATA:  Short of breath.  Abdominal pain.  EXAM: PORTABLE CHEST - 1 VIEW  COMPARISON:  None.  FINDINGS: Heart size upper limits of normal for projection. Pulmonary vascular congestion is present with fullness of the RIGHT hilum. Infrahilar density is present which may represent atelectasis however aspiration or pneumonia cannot be excluded. No gross pleural effusion. Monitoring leads project over the chest.  IMPRESSION: Pulmonary vascular congestion and RIGHT infrahilar airspace disease which may represent pneumonia or aspiration pneumonitis.   Electronically Signed   By: Dereck Ligas M.D.   On: 02/27/2014 15:47     EKG Interpretation   Date/Time:  Saturday February 27 2014 13:59:25 EDT Ventricular Rate:  105 PR Interval:  176 QRS Duration: 93 QT Interval:  347 QTC Calculation: 459 R Axis:   36 Text Interpretation:  Sinus tachycardia Probable anteroseptal infarct, old  Confirmed by COOK  MD, BRIAN ([id number]) on 02/27/2014 2:11:15 PM      MDM   Final diagnoses:  None   SIRS/sepsis  Pneumonia  Acute respiratory failure  She presents to ER for multiple complaints. Patient reports that she has had a change in her hearing recently. He feels like "everything is under water". Visual inspection was unremarkable. She is hard of hearing at baseline. Etiology of this is unclear at this time. No other neurologic findings on examination.  Patient was, however, found to be tachycardic, hypoxic and febrile upon arrival. She has been experiencing a pain in the right rib fields for the last couple of days. The area is tender to touch. X-ray confirms pneumonia on the right side which explains the symptoms. Patient was very somnolent upon arrival. Blood gas confirms CO2 retention, likely causing some of her somnolence. She has become much more  awake and alert here in the  ER without any intervention. She is protecting her airway does not appear to be in any significant respiratory distress. Monitor closely, consider BiPAP if hypercarbia worsens. Patient will require hospitalization for pneumonia.  Patient is a dialysis patient, normally Monday Wednesday Friday. She does not have a local doctor. She is from [place], and receives her dialysis and Advil. Will require nephrology to follow. She will not require, however, urgent or emergent dialysis today.  I personally performed the services described in this documentation, which was scribed in my presence. The recorded information has been reviewed and is accurate.  CRITICAL CARE Performed by: Orpah Greek   Total critical care time: 76min  Critical care time was exclusive of separately billable procedures and treating other patients.  Critical care was necessary to treat or prevent imminent or life-threatening deterioration.  Critical care was time spent personally by me on the following activities: development of treatment plan with patient and/or surrogate as well as nursing, discussions with consultants, evaluation of patient's response to treatment, examination of patient, obtaining history from patient or surrogate, ordering and performing treatments and interventions, ordering and review of laboratory studies, ordering and review of radiographic studies, pulse oximetry and re-evaluation of patient's condition.     Orpah Greek, MD 02/27/14 [id number]

## 2014-02-27 NOTE — ED Notes (Signed)
Pt reports "it sounds like [place] am [place] the beach." pt family denies any recent injury/trauma. Pt also reports right abdominal pain since Thursday. Mild tremors noted in triage. Pt family reports " that comes and goes for the last several years." no neuro defecits noted in triage. Pt alert and oriented. Pt very hard of hearing.

## 2014-02-27 NOTE — ED Notes (Signed)
Pt placed on 2 liters. Pt oxygen on 2 liters 94%.

## 2014-02-27 NOTE — Progress Notes (Signed)
ANTIBIOTIC CONSULT NOTE - INITIAL  Pharmacy Consult for Vancomycin and renal dose adjustment of antibiotics  Indication: pneumonia  Allergies  Allergen Reactions  . Dilaudid [Hydromorphone Hcl] Anaphylaxis  . Hydrocodone-Acetaminophen Anaphylaxis  . Tylenol [Acetaminophen] Anaphylaxis  . Ace Inhibitors   . Alprazolam Other (See Comments)    Hallucination  . Bee Venom Swelling  . Zolpidem Tartrate Other (See Comments)    Hallucinations   Patient Measurements: Height: 5\' 2"  (157.5 cm) Weight: 259 lb 6.4 oz (117.663 kg) IBW/kg (Calculated) : 50.1  Vital Signs: Temp: 98.6 F (37 C) (09/12 2014) Temp src: Oral (09/12 2014) BP: 125/61 mmHg (09/12 2014) Pulse Rate: 98 (09/12 2028) Intake/Output from previous day:   Intake/Output from this shift:    Labs:  Recent Labs  02/27/14 1446  WBC 9.8  HGB 10.6*  PLT 197  CREATININE 6.46*   Estimated Creatinine Clearance: 12.5 ml/min (by C-G formula based on Cr of 6.46). No results found for this basename: VANCOTROUGH, VANCOPEAK, VANCORANDOM, GENTTROUGH, GENTPEAK, GENTRANDOM, TOBRATROUGH, TOBRAPEAK, TOBRARND, AMIKACINPEAK, AMIKACINTROU, AMIKACIN,  in the last 72 hours   Microbiology: No results found for this or any previous visit (from the past 720 hour(s)).  Medical History: Past Medical History  Diagnosis Date  . Diabetes mellitus   . Osteomyelitis 2009  . Hypertension   . ESRD on hemodialysis   . Gastroparesis   . Depression   . 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   Anti-infectives   Start     Dose/Rate Route Frequency Ordered Stop   02/28/14 1200  cefTRIAXone (ROCEPHIN) 1 g in dextrose 5 % 50 mL IVPB     1 g 100 mL/hr over 30 Minutes Intravenous Every 24 hours 02/27/14 1954     02/28/14 1100  azithromycin (ZITHROMAX) 500 mg in dextrose 5 % 250 mL IVPB     500 mg 250 mL/hr over 60 Minutes Intravenous Every 24 hours 02/27/14 1954     02/27/14 2200  ceFEPIme (MAXIPIME) 1 g in dextrose 5 % 50 mL IVPB  Status:   Discontinued     1 g 100 mL/hr over 30 Minutes Intravenous 3 times per day 02/27/14 1959 02/27/14 2034   02/27/14 2200  vancomycin (VANCOCIN) 2,000 mg in sodium chloride 0.9 % 500 mL IVPB     2,000 mg 250 mL/hr over 120 Minutes Intravenous  Once 02/27/14 2036     02/27/14 2100  ceFEPIme (MAXIPIME) 2 g in dextrose 5 % 50 mL IVPB     2 g 100 mL/hr over 30 Minutes Intravenous  Once 02/27/14 2035     02/27/14 1445  cefTRIAXone (ROCEPHIN) 1 g in dextrose 5 % 50 mL IVPB     1 g 100 mL/hr over 30 Minutes Intravenous  Once 02/27/14 1437 02/27/14 1718   02/27/14 1445  azithromycin (ZITHROMAX) 500 mg in dextrose 5 % 250 mL IVPB     500 mg 250 mL/hr over 60 Minutes Intravenous  Once 02/27/14 1437 02/27/14 1820      Assessment: 53yo female with ESRD requiring dialysis.  Admitted with presumed pneumonia.  Pt received Rocephin and Zithromax on admission.  Pt now has orders for Cefepime and Vancomycin.    Goal of Therapy:  Pre-Hemodialysis Vancomycin level goal range =15-25 mcg/ml  Plan:  Vancomycin 2000mg  IV tonight x 1 dose Adjust Cefepime- 2000mg  IV tonight x 1 dose (renally adjusted) Recommend to d/c Rocephin and Zithromax and continue Vancomycin and Cefepime F/U dialysis schedule for further dosing of abx.  Hart Robinsons A 02/27/2014,8:42  PM

## 2014-02-27 NOTE — H&P (Signed)
PCP: Kennieth Rad Renal: [facility]   Chief Complaint:  Ear fullness and righ flank pain  HPI: Kayla Gibson is a 53 y.o. 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   Presented with  3-4 days ago she started to have pain in her right side. The pain didn't change with respiration. Denies any fever or chills. Patient is from [place]. She has hx of ESRD on HD since 2008 on Monday, Wednesday Friday. Last Hd was on Friday and she was able to complete it. Husband states today patient became confused earlier today.  She was not able to understand where she was. ON arrival to Er she was found to be hypoxic down to 80% and improved on 2 L of O2. She is on CPAP at home at night. Denies any change in medication. CXR was done showing possible PNA.  Denies any leg swelling. They just had a car trip to [place]. ABG 7.325/59.4/87.3 Family states she have had gradual worsening in her confusion and slurring of the speech since 11 AM today. She is wheelchair at baseline. Denies localized weakness. Currently after being put on oxygen her mental status has improved.   Hospitalist was called for admission for HCAP  Review of Systems:    Pertinent positives include:  Ear fullness, chest wall pain,  confusion  Constitutional:  No weight loss, night sweats, Fevers, chills, fatigue, weight loss  HEENT:  No headaches, Difficulty swallowing,Tooth/dental problems,Sore throat,  No sneezing, itching, ear ache, nasal congestion, post nasal drip,  Cardio-vascular:  No chest pain, Orthopnea, PND, anasarca, dizziness, palpitations.no Bilateral lower extremity swelling  GI:  No heartburn, indigestion, abdominal pain, nausea, vomiting, diarrhea, change in bowel habits, loss of appetite, melena, blood in stool, hematemesis Resp:  no shortness of breath at rest. No dyspnea  on exertion, No excess mucus, no productive cough, No non-productive cough, No coughing up of blood.No change in color of mucus.No wheezing. Skin:  no rash or lesions. No jaundice GU:  no dysuria, change in color of urine, no urgency or frequency. No straining to urinate.  No flank pain.  Musculoskeletal:  No joint pain or no joint swelling. No decreased range of motion. No back pain.  Psych:  No change in mood or affect. No depression or anxiety. No memory loss.  Neuro: no localizing neurological complaints, no tingling, no weakness, no double vision, no gait abnormality, no slurred speech, no  Otherwise ROS are negative except for above, 10 systems were reviewed  Past Medical History: Past Medical History  Diagnosis Date  . Diabetes mellitus   . Osteomyelitis 2009  . Hypertension   . ESRD on hemodialysis   . Gastroparesis   . Depression   . 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 Back surgery    . Cholecystectomy    . Av fistula placement      left arm  . Pacemaker insertion      for gastroparesis  . Colonoscopy  11/06/2011    Procedure: COLONOSCOPY;  Surgeon: Rogene Houston, MD;  Location: [facility];  Service: Endoscopy;  Laterality: N/A;     Medications: Prior to Admission medications   Medication Sig Start Date End Date Taking? Authorizing Provider  amLODipine (NORVASC) 10 MG tablet Take 10 mg by mouth daily.   Yes Historical Provider, MD  aspirin EC 81 MG tablet  Take 81 mg by mouth every morning.    Yes Historical Provider, MD  calcium acetate (PHOSLO) 667 MG capsule Take 667 mg by mouth 3 (three) times daily.     Yes Historical Provider, MD  carvedilol (COREG) 25 MG tablet Take 25 mg by mouth 2 (two) times daily with a meal.     Yes Historical Provider, MD  cinacalcet (SENSIPAR) 30 MG tablet Take 30 mg by mouth every evening.     Yes Historical Provider, MD  clopidogrel (PLAVIX) 75 MG tablet Take 75 mg by mouth  daily.   Yes Historical Provider, MD  famotidine (PEPCID) 20 MG tablet Take 20 mg by mouth 2 (two) times daily.   Yes Historical Provider, MD  fenofibrate (TRICOR) 48 MG tablet Take 48 mg by mouth daily.   Yes Historical Provider, MD  hydrALAZINE (APRESOLINE) 50 MG tablet Take 50 mg by mouth 4 (four) times daily.   Yes Historical Provider, MD  insulin glargine (LANTUS) 100 UNIT/ML injection Inject 25 Units into the skin at bedtime.     Yes Historical Provider, MD  insulin lispro (HUMALOG) 100 UNIT/ML injection Inject 15 Units into the skin 3 (three) times daily before meals.     Yes Historical Provider, MD  isosorbide mononitrate (IMDUR) 30 MG 24 hr tablet Take 30 mg by mouth daily.   Yes Historical Provider, MD  lisinopril (PRINIVIL,ZESTRIL) 20 MG tablet Take 20 mg by mouth 2 (two) times daily.   Yes Historical Provider, MD  pravastatin (PRAVACHOL) 20 MG tablet Take 20 mg by mouth every morning.    Yes Historical Provider, MD  pregabalin (LYRICA) 75 MG capsule Take 75 mg by mouth daily.    Yes Historical Provider, MD  QUEtiapine Fumarate (SEROQUEL XR) 150 MG 24 hr tablet Take 150 mg by mouth at bedtime.     Yes Historical Provider, MD  sevelamer (RENVELA) 800 MG tablet Take 800 mg by mouth 3 (three) times daily with meals.     Yes Historical Provider, MD  sucroferric oxyhydroxide (VELPHORO) 500 MG chewable tablet Chew 500 mg by mouth 3 (three) times daily with meals.   Yes Historical Provider, MD  traMADol (ULTRAM) 50 MG tablet Take 50 mg by mouth daily as needed. For back  pain    Yes Historical Provider, MD  promethazine (PHENERGAN) 25 MG suppository Place 1 suppository (25 mg total) rectally every 6 (six) hours as needed for nausea. 11/20/11 05/08/12  Prentiss Bells, MD    Allergies:   Allergies  Allergen Reactions  . Dilaudid [Hydromorphone Hcl] Anaphylaxis  . Hydrocodone-Acetaminophen Anaphylaxis  . Tylenol [Acetaminophen] Anaphylaxis  . Ace Inhibitors   . Alprazolam Other (See Comments)     Hallucination  . Bee Venom Swelling  . Zolpidem Tartrate Other (See Comments)    Hallucinations    Social History:  Ambulatory  wheelchair bound,   Lives at home   With family     reports that she has never smoked. She does not have any smokeless tobacco history on file. She reports that she does not drink alcohol or use illicit drugs.    Family History: family history includes Cancer (age of onset: 43) in her mother; Diabetes in her mother and sister; Heart failure in her father; Hypertension in her sister; Stroke (age of onset: 62) in her father.    Physical Exam: Patient Vitals for the past 24 hrs:  BP Temp Temp src Pulse Resp SpO2  02/27/14 1714 104/60 mmHg - - 94 14 98 %  02/27/14 1553 104/67 mmHg 97.4 F (36.3 C) Oral 100 14 99 %  02/27/14 1404 - - - - - 100 %  02/27/14 1341 126/59 mmHg 100 F (37.8 C) Oral 105 20 80 %    1. General:  in No Acute distress 2. Psychological: Alert and   Oriented 3. Head/ENT:   Moist   Mucous Membranes                          Head Non traumatic, neck supple                            Poor Dentition                          No sinus tenderness 4. SKIN: normal  Skin turgor,  Skin clean Dry and intact no rash 5. Heart: Regular rate and rhythm no Murmur, Rub or gallop 6. Lungs: Clear to auscultation bilaterally, no wheezes or crackles, somewhat distant   7. Abdomen: Soft, non-tender, Non distended,obese 8. Lower extremities: no clubbing, cyanosis, or edema 9. Neurologically CN 2-12 intact, strength 5/5 in all 4 ext except right foot drop that is chronic. asterixis present 10. MSK: Normal range of motion Chest wall pain reproducible by palpation body mass index is unknown because there is no weight on file.   Labs on Admission:   Recent Labs  02/27/14 1446  NA 137  K 4.3  CL 93*  CO2 30  GLUCOSE 221*  BUN 27*  CREATININE 6.46*  CALCIUM 8.5    Recent Labs  02/27/14 1446  AST 31  ALT 20  ALKPHOS 109  BILITOT 0.6    PROT 8.3  ALBUMIN 3.8   No results found for this basename: LIPASE, AMYLASE,  in the last 72 hours  Recent Labs  02/27/14 1446  WBC 9.8  NEUTROABS 7.4  HGB 10.6*  HCT 33.1*  MCV 95.7  PLT 197    Recent Labs  02/27/14 1446  TROPONINI <0.30   No results found for this basename: TSH, T4TOTAL, FREET3, T3FREE, THYROIDAB,  in the last 72 hours No results found for this basename: VITAMINB12, FOLATE, FERRITIN, TIBC, IRON, RETICCTPCT,  in the last 72 hours Lab Results  Component Value Date   HGBA1C 7.6* 11/04/2011    The CrCl is unknown because both a height and weight (above a minimum accepted value) are required for this calculation. ABG    Component Value Date/Time   PHART 7.325* 02/27/2014 1507   HCO3 30.1* 02/27/2014 1507   TCO2 28.3 02/27/2014 1507   O2SAT 95.7 02/27/2014 1507     No results found for this basename: DDIMER     Other results:  I have pearsonaly reviewed this: ECG REPORT  Rate: 105  Rhythm: ST ST&T Change: no ischemic changes  BNP (last 3 results)  Recent Labs  02/27/14 1446  PROBNP 429.8*    There were no vitals filed for this visit.   Cultures:    Component Value Date/Time   SDES STOOL 11/06/2011 0830   SPECREQUEST NONE 11/06/2011 0830   CULT  Value: NO SALMONELLA, SHIGELLA, CAMPYLOBACTER, YERSINIA, OR E.COLI 0157:H7 ISOLATED Note: REDUCED NORMAL FLORA PRESENT 11/06/2011 0830   REPTSTATUS 11/10/2011 FINAL 11/06/2011 0830      Radiological Exams on Admission: Dg Chest Port 1 View  02/27/2014   CLINICAL DATA:  Short of breath.  Abdominal  pain.  EXAM: PORTABLE CHEST - 1 VIEW  COMPARISON:  None.  FINDINGS: Heart size upper limits of normal for projection. Pulmonary vascular congestion is present with fullness of the RIGHT hilum. Infrahilar density is present which may represent atelectasis however aspiration or pneumonia cannot be excluded. No gross pleural effusion. Monitoring leads project over the chest.  IMPRESSION: Pulmonary vascular  congestion and RIGHT infrahilar airspace disease which may represent pneumonia or aspiration pneumonitis.   Electronically Signed   By: Dereck Ligas M.D.   On: 02/27/2014 15:47    Chart has been reviewed  Assessment/Plan  53 yo F w hx of ESRD on HD M, W, F presents with confusion and right side chest pain in the setting of hypoxia and HCAP on CXR  Present on Admission:  . HCAP (healthcare-associated pneumonia) - admit on IV antibiotics, provide oxygen, given severity of hypoxia and hx of travel and chest pain will obtain CT angio to eval for PE . HTN (hypertension) - given soft BP will hold Norvasc, hydralazine . ESRD (end stage renal disease) - ER MD spoke to [facility] who is aware of the patient  . Diabetes mellitus type II, uncontrolled  - decrease Lantus while inpatient , SSI Given confusion - will obtain CT head. No localized neurological complaints, confusion improved with oxygen was likely due to hypoxia. Check ammonia level given asterixis. ABG showed mild hypercarbia, patient is improved currently avoid sedating medications.  OSA - CPAP at night . Hypoxia - provide oxygen, eval for PE . Chest pain - atypical likely musculoskeletal vs. pleuritic in the setting got HCAP but given risk factors will check for PE, cycle troponin   Prophylaxis:  Lovenox, Protonix  CODE STATUS:  FULL CODE   Other plan as per orders.  I have spent a total of 55 min on this admission  Kayla Gibson 02/27/2014, 5:22 PM  [facility]  Pager [phone]   If 7AM-7PM, please contact the day team taking care of the patient  [email or web address]  Password [email or web address]

## 2014-02-28 DIAGNOSIS — I10 Essential (primary) hypertension: Secondary | ICD-10-CM

## 2014-02-28 LAB — GLUCOSE, CAPILLARY
GLUCOSE-CAPILLARY: 193 mg/dL — AB (ref 70–99)
Glucose-Capillary: 223 mg/dL — ABNORMAL HIGH (ref 70–99)
Glucose-Capillary: 194 mg/dL — ABNORMAL HIGH (ref 70–99)
Glucose-Capillary: 171 mg/dL — ABNORMAL HIGH (ref 70–99)

## 2014-02-28 LAB — COMPREHENSIVE METABOLIC PANEL
ALT: 15 U/L (ref 0–35)
AST: 22 U/L (ref 0–37)
Albumin: 3.4 g/dL — ABNORMAL LOW (ref 3.5–5.2)
Alkaline Phosphatase: 94 U/L (ref 39–117)
Anion gap: 14 (ref 5–15)
BUN: 38 mg/dL — ABNORMAL HIGH (ref 6–23)
CALCIUM: 8.4 mg/dL (ref 8.4–10.5)
CO2: 28 meq/L (ref 19–32)
Chloride: 90 mEq/L — ABNORMAL LOW (ref 96–112)
Creatinine, Ser: 8.12 mg/dL — ABNORMAL HIGH (ref 0.50–1.10)
GFR calc Af Amer: 6 mL/min — ABNORMAL LOW (ref >90)
GFR calc non Af Amer: 5 mL/min — ABNORMAL LOW (ref >90)
Glucose, Bld: 200 mg/dL — ABNORMAL HIGH (ref 70–99)
Potassium: 3.9 mEq/L (ref 3.7–5.3)
SODIUM: 132 meq/L — AB (ref 137–147)
TOTAL PROTEIN: 7.5 g/dL (ref 6.0–8.3)
Total Bilirubin: 0.4 mg/dL (ref 0.3–1.2)

## 2014-02-28 LAB — TROPONIN I: Troponin I: <0.3 ng/mL (ref <0.30)

## 2014-02-28 MED ORDER — PENTAFLUOROPROP-TETRAFLUOROETH EX AERO
1.0000 "application " | INHALATION_SPRAY | CUTANEOUS | Status: DC | PRN
  Filled 2014-02-28: qty 30

## 2014-02-28 MED ORDER — LIDOCAINE-PRILOCAINE 2.5-2.5 % EX CREA: 1.0000 "application " | TOPICAL_CREAM | CUTANEOUS | Status: DC | PRN

## 2014-02-28 MED ORDER — FAMOTIDINE 20 MG PO TABS
20.0000 mg | ORAL_TABLET | Freq: Every day | ORAL | Status: DC
  Administered 2014-03-01 – 2014-03-04 (×4): 20 mg via ORAL
  Filled 2014-02-28 (×4): qty 1

## 2014-02-28 MED ORDER — NEPRO/CARBSTEADY PO LIQD: 237.0000 mL | ORAL | Status: DC | PRN

## 2014-02-28 MED ORDER — SODIUM CHLORIDE 0.9 % IV SOLN: 100.0000 mL | INTRAVENOUS | Status: DC | PRN

## 2014-02-28 MED ORDER — EPOETIN ALFA 10000 UNIT/ML IJ SOLN
10000.0000 [IU] | INTRAMUSCULAR | Status: DC
  Filled 2014-02-28 (×2): qty 1

## 2014-02-28 MED ORDER — LIDOCAINE HCL (PF) 1 % IJ SOLN: 5.0000 mL | INTRAMUSCULAR | Status: DC | PRN

## 2014-02-28 MED ORDER — BUDESONIDE 0.25 MG/2ML IN SUSP
RESPIRATORY_TRACT | Status: AC
  Filled 2014-02-28: qty 2

## 2014-02-28 MED ORDER — BUDESONIDE 0.25 MG/2ML IN SUSP
0.2500 mg | Freq: Two times a day (BID) | RESPIRATORY_TRACT | Status: DC
  Administered 2014-02-28 – 2014-03-04 (×8): 0.25 mg via RESPIRATORY_TRACT
  Filled 2014-02-28 (×10): qty 2

## 2014-02-28 MED ORDER — HEPARIN SODIUM (PORCINE) 1000 UNIT/ML DIALYSIS
1000.0000 [IU] | INTRAMUSCULAR | Status: DC | PRN
  Filled 2014-02-28: qty 1

## 2014-02-28 MED ORDER — ALTEPLASE 2 MG IJ SOLR
2.0000 mg | Freq: Once | INTRAMUSCULAR | Status: AC | PRN
  Filled 2014-02-28: qty 2

## 2014-02-28 MED ORDER — PANTOPRAZOLE SODIUM 40 MG PO TBEC
40.0000 mg | DELAYED_RELEASE_TABLET | Freq: Every day | ORAL | Status: DC
  Administered 2014-02-28 – 2014-03-04 (×5): 40 mg via ORAL
  Filled 2014-02-28 (×5): qty 1

## 2014-02-28 MED ORDER — INSULIN GLARGINE 100 UNIT/ML ~~LOC~~ SOLN
SUBCUTANEOUS | Status: AC
  Filled 2014-02-28: qty 10

## 2014-02-28 NOTE — Progress Notes (Signed)
Pt stating in upper 90's on c-pap. No sign of distress. Husband at bedside. Will continue to monitor.

## 2014-02-28 NOTE — Procedures (Signed)
   HEMODIALYSIS TREATMENT NOTE:  3.75 hour heparin-free dialysis completed via left forearm loop AVG with retrograde flow.  Goal NOT met:  BP unable to tolerate removal of 3 liters as ordered.  UF interrupted x30 minutes for asymptomatic SBP<90 x2.  Net UF removed 2.5 liters.  All blood was reinfused and hemostasis was achieved within 15 minutes.  Report given to [place], RN.  Kama Cammarano L. Osha Errico, RN, CDN

## 2014-02-28 NOTE — Consult Note (Signed)
Reason for Consult: End-stage disease and CHF Referring Physician: Dr. Kerney Elbe Kiernan is an 53 y.o. female.  HPI: She is a patient who has history of hypertension, diabetes and end-stage renal disease on maintenance hemodialysis presently came with complaints of her right flank pain and loss of hearing on the right side. Patient is still that she started having right-sided pain for the last 3 to4 days without much improvement. Is not aggravated by movement and or cough. The pain is also consistent. About 2 days ago she started having problem of hearing on the right side. She denies any drainage. She still is she's not feeling much better. According to her chart from this also says that she had some confusion after she finished her dialysis on Friday. In emergency room patient was found to have hypoxemia and she was put on oxygen with some improvement. Presently patient is on oxygen and she denies any difficulty breathing. She is on CPAP at home.  Past Medical History  Diagnosis Date  . Diabetes mellitus   . Osteomyelitis 2009  . Hypertension   . ESRD on hemodialysis   . Gastroparesis   . Depression   . 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 Back surgery    . Cholecystectomy    . Av fistula placement      left arm  . Pacemaker insertion      for gastroparesis  . Colonoscopy  11/06/2011    Procedure: COLONOSCOPY;  Surgeon: Rogene Houston, MD;  Location: [facility];  Service: Endoscopy;  Laterality: N/A;    Family History  Problem Relation Age of Onset  . Cancer Mother 26    stomach  . Diabetes Mother   . Stroke Father 6  . Heart failure Father   . Diabetes Sister   . Hypertension Sister     Social History:  reports that she has never smoked. She does not have any smokeless tobacco history on file. She reports that she does not drink alcohol or use illicit drugs.  Allergies:  Allergies  Allergen Reactions  .  Dilaudid [Hydromorphone Hcl] Anaphylaxis  . Hydrocodone-Acetaminophen Anaphylaxis  . Tylenol [Acetaminophen] Anaphylaxis  . Ace Inhibitors   . Alprazolam Other (See Comments)    Hallucination  . Bee Venom Swelling  . Zolpidem Tartrate Other (See Comments)    Hallucinations    Medications: I have reviewed the patient's current medications.  Results for orders placed during the hospital encounter of 02/27/14 (from the past 48 hour(s))  CULTURE, BLOOD (ROUTINE X 2)     Status: None   Collection Time    02/27/14  2:46 PM      Result Value Ref Range   Specimen Description RIGHT ANTECUBITAL     Special Requests BOTTLES DRAWN AEROBIC ONLY 10CC     Culture NO GROWTH 1 DAY     Report Status PENDING    CBC WITH DIFFERENTIAL     Status: Abnormal   Collection Time    02/27/14  2:46 PM      Result Value Ref Range   WBC 9.8  4.0 - 10.5 K/uL   RBC 3.46 (*) 3.87 - 5.11 MIL/uL   Hemoglobin 10.6 (*) 12.0 - 15.0 g/dL   HCT 33.1 (*) 36.0 - 46.0 %   MCV 95.7  78.0 - 100.0 fL   MCH 30.6  26.0 - 34.0 pg   MCHC 32.0  30.0 - 36.0 g/dL  RDW 14.6  11.5 - 15.5 %   Platelets 197  150 - 400 K/uL   Neutrophils Relative % 77  43 - 77 %   Neutro Abs 7.4  1.7 - 7.7 K/uL   Lymphocytes Relative 17  12 - 46 %   Lymphs Abs 1.7  0.7 - 4.0 K/uL   Monocytes Relative 4  3 - 12 %   Monocytes Absolute 0.4  0.1 - 1.0 K/uL   Eosinophils Relative 2  0 - 5 %   Eosinophils Absolute 0.2  0.0 - 0.7 K/uL   Basophils Relative 0  0 - 1 %   Basophils Absolute 0.0  0.0 - 0.1 K/uL  COMPREHENSIVE METABOLIC PANEL     Status: Abnormal   Collection Time    02/27/14  2:46 PM      Result Value Ref Range   Sodium 137  137 - 147 mEq/L   Potassium 4.3  3.7 - 5.3 mEq/L   Chloride 93 (*) 96 - 112 mEq/L   CO2 30  19 - 32 mEq/L   Glucose, Bld 221 (*) 70 - 99 mg/dL   BUN 27 (*) 6 - 23 mg/dL   Creatinine, Ser 6.46 (*) 0.50 - 1.10 mg/dL   Calcium 8.5  8.4 - 10.5 mg/dL   Total Protein 8.3  6.0 - 8.3 g/dL   Albumin 3.8  3.5 - 5.2  g/dL   AST 31  0 - 37 U/L   ALT 20  0 - 35 U/L   Alkaline Phosphatase 109  39 - 117 U/L   Total Bilirubin 0.6  0.3 - 1.2 mg/dL   GFR calc non Af Amer 7 (*) >90 mL/min   GFR calc Af Amer 8 (*) >90 mL/min   Comment: (NOTE)     The eGFR has been calculated using the CKD EPI equation.     This calculation has not been validated in all clinical situations.     eGFR's persistently <90 mL/min signify possible Chronic Kidney     Disease.   Anion gap 14  5 - 15  TROPONIN I     Status: None   Collection Time    02/27/14  2:46 PM      Result Value Ref Range   Troponin I <0.30  <0.30 ng/mL   Comment:            Due to the release kinetics of cTnI,     a negative result within the first hours     of the onset of symptoms does not rule out     myocardial infarction with certainty.     If myocardial infarction is still suspected,     repeat the test at appropriate intervals.  PRO B NATRIURETIC PEPTIDE     Status: Abnormal   Collection Time    02/27/14  2:46 PM      Result Value Ref Range   Pro B Natriuretic peptide (BNP) 429.8 (*) 0 - 125 pg/mL  CULTURE, BLOOD (ROUTINE X 2)     Status: None   Collection Time    02/27/14  2:47 PM      Result Value Ref Range   Specimen Description BLOOD RIGHT HAND     Special Requests BOTTLES DRAWN AEROBIC ONLY 10CC     Culture NO GROWTH 1 DAY     Report Status PENDING    I-STAT CG4 LACTIC ACID, ED     Status: None   Collection Time    02/27/14  2:58 PM      Result Value Ref Range   Lactic Acid, Venous 0.79  0.5 - 2.2 mmol/L  BLOOD GAS, ARTERIAL     Status: Abnormal   Collection Time    02/27/14  3:07 PM      Result Value Ref Range   O2 Content 3.0     Delivery systems NASAL CANNULA     pH, Arterial 7.325 (*) 7.350 - 7.450   pCO2 arterial 59.4 (*) 35.0 - 45.0 mmHg   Comment: CRITICAL RESULT CALLED TO, READ BACK BY AND VERIFIED WITH:     TIFFANY VOGLER,RN ON 02/27/2014 BY PEVIANY LAWSON,RRT [id number].   pO2, Arterial 87.3  80.0 - 100.0 mmHg    Bicarbonate 30.1 (*) 20.0 - 24.0 mEq/L   TCO2 28.3  0 - 100 mmol/L   Acid-Base Excess 4.5 (*) 0.0 - 2.0 mmol/L   O2 Saturation 95.7     Patient temperature 37.0     Collection site RIGHT RADIAL     Drawn by [phone]     Sample type ARTERIAL     Allens test (pass/fail) PASS  PASS  AMMONIA     Status: None   Collection Time    02/27/14  7:03 PM      Result Value Ref Range   Ammonia 27  11 - 60 umol/L  PROCALCITONIN     Status: None   Collection Time    02/27/14  8:10 PM      Result Value Ref Range   Procalcitonin 0.73     Comment:            Interpretation:     PCT > 0.5 ng/mL and <= 2 ng/mL:     Systemic infection (sepsis) is possible,     but other conditions are known to elevate     PCT as well.     (NOTE)             ICU PCT Algorithm               Non ICU PCT Algorithm        ----------------------------     ------------------------------             PCT < 0.25 ng/mL                 PCT < 0.1 ng/mL         Stopping of antibiotics            Stopping of antibiotics           strongly encouraged.               strongly encouraged.        ----------------------------     ------------------------------           PCT level decrease by               PCT < 0.25 ng/mL           >= 80% from peak PCT           OR PCT 0.25 - 0.5 ng/mL          Stopping of antibiotics                                                 encouraged.  Stopping of antibiotics               encouraged.        ----------------------------     ------------------------------           PCT level decrease by              PCT >= 0.25 ng/mL           < 80% from peak PCT            AND PCT >= 0.5 ng/mL            Continuing antibiotics                                                  encouraged.           Continuing antibiotics                encouraged.        ----------------------------     ------------------------------         PCT level increase compared          PCT > 0.5 ng/mL             with peak  PCT AND              PCT >= 0.5 ng/mL             Escalation of antibiotics                                              strongly encouraged.          Escalation of antibiotics            strongly encouraged.  GLUCOSE, CAPILLARY     Status: Abnormal   Collection Time    02/27/14  8:12 PM      Result Value Ref Range   Glucose-Capillary 213 (*) 70 - 99 mg/dL   Comment 1 Documented in Chart     Comment 2 Notify RN    CBC     Status: Abnormal   Collection Time    02/27/14  8:28 PM      Result Value Ref Range   WBC 10.0  4.0 - 10.5 K/uL   RBC 3.24 (*) 3.87 - 5.11 MIL/uL   Hemoglobin 10.1 (*) 12.0 - 15.0 g/dL   HCT 31.1 (*) 36.0 - 46.0 %   MCV 96.0  78.0 - 100.0 fL   MCH 31.2  26.0 - 34.0 pg   MCHC 32.5  30.0 - 36.0 g/dL   RDW 14.7  11.5 - 15.5 %   Platelets 188  150 - 400 K/uL  CREATININE, SERUM     Status: Abnormal   Collection Time    02/27/14  8:28 PM      Result Value Ref Range   Creatinine, Ser 6.65 (*) 0.50 - 1.10 mg/dL   GFR calc non Af Amer 6 (*) >90 mL/min   GFR calc Af Amer 8 (*) >90 mL/min   Comment: (NOTE)     The eGFR has been calculated using the CKD EPI equation.     This calculation has not been validated in all clinical situations.  eGFR's persistently <90 mL/min signify possible Chronic Kidney     Disease.  TROPONIN I     Status: None   Collection Time    02/27/14  8:28 PM      Result Value Ref Range   Troponin I <0.30  <0.30 ng/mL   Comment:            Due to the release kinetics of cTnI,     a negative result within the first hours     of the onset of symptoms does not rule out     myocardial infarction with certainty.     If myocardial infarction is still suspected,     repeat the test at appropriate intervals.  TROPONIN I     Status: None   Collection Time    02/28/14  1:47 AM      Result Value Ref Range   Troponin I <0.30  <0.30 ng/mL   Comment:            Due to the release kinetics of cTnI,     a negative result within the first hours      of the onset of symptoms does not rule out     myocardial infarction with certainty.     If myocardial infarction is still suspected,     repeat the test at appropriate intervals.  GLUCOSE, CAPILLARY     Status: Abnormal   Collection Time    02/28/14  7:13 AM      Result Value Ref Range   Glucose-Capillary 223 (*) 70 - 99 mg/dL   Comment 1 Notify RN      Ct Head Wo Contrast  02/27/2014   CLINICAL DATA:  Confusion.  EXAM: CT HEAD WITHOUT CONTRAST  TECHNIQUE: Contiguous axial images were obtained from the base of the skull through the vertex without intravenous contrast.  COMPARISON:  None.  FINDINGS: No acute cortical infarct, hemorrhage, or mass lesion ispresent. Ventricles are of normal size. No significant extra-axial fluid collection is present. The paranasal sinuses andmastoid air cells are clear. The osseous skull is intact.  IMPRESSION: 1. No acute intracranial abnormalities.  Normal brain.   Electronically Signed   By: Kerby Moors M.D.   On: 02/27/2014 21:48   Ct Angio Chest Pe W/cm &/or Wo Cm  02/27/2014   CLINICAL DATA:  Right rib pain for 3 days.  Hypoxia.  EXAM: CT ANGIOGRAPHY CHEST WITH CONTRAST  TECHNIQUE: Multidetector CT imaging of the chest was performed using the standard protocol during bolus administration of intravenous contrast. Multiplanar CT image reconstructions and MIPs were obtained to evaluate the vascular anatomy.  CONTRAST:  143m OMNIPAQUE IOHEXOL 350 MG/ML SOLN  COMPARISON:  None  FINDINGS: Dependent changes are identified within the posterior lung bases. No pleural effusion identified. No airspace consolidation or pulmonary edema noted.  Moderate cardiac enlargement. No pericardial effusion. There is a patulous and dilated thoracic esophagus. Fluid level is identified within the distal esophagus. There is anteroposterior tracheal narrowing. There is luminal narrowing of both the right and left mainstem bronchi. No mediastinal or hilar adenopathy. The main pulmonary  artery appears patent. No lobar or segmental pulmonary artery filling defects identified to suggest an acute pulmonary embolus.  Incidental imaging through the upper abdomen is unremarkable.  Review of the visualized osseous structures is on unremarkable.  Review of the MIP images confirms the above findings.  IMPRESSION: 1. No evidence for acute pulmonary embolus. 2. Moderate cardiac enlargement. 3. Patulous and dilated esophagus  with distal fluid level. This is of uncertain significance but may be related to patient's gastroparesis. 4. 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 Findings may be related to patient's hypoxia.   Electronically Signed   By: Kerby Moors M.D.   On: 02/27/2014 22:03   Dg Chest Port 1 View  02/27/2014   CLINICAL DATA:  Short of breath.  Abdominal pain.  EXAM: PORTABLE CHEST - 1 VIEW  COMPARISON:  None.  FINDINGS: Heart size upper limits of normal for projection. Pulmonary vascular congestion is present with fullness of the RIGHT hilum. Infrahilar density is present which may represent atelectasis however aspiration or pneumonia cannot be excluded. No gross pleural effusion. Monitoring leads project over the chest.  IMPRESSION: Pulmonary vascular congestion and RIGHT infrahilar airspace disease which may represent pneumonia or aspiration pneumonitis.   Electronically Signed   By: Dereck Ligas M.D.   On: 02/27/2014 15:47    Review of Systems  Constitutional: Negative for fever.  HENT: Positive for hearing loss. Negative for ear discharge and ear pain.   Respiratory: Negative for cough and shortness of breath.   Cardiovascular: Negative for chest pain.  Gastrointestinal: Negative for nausea and vomiting.   Blood pressure 150/75, pulse 91, temperature 98.4 F (36.9 C), temperature source Oral, resp. rate 20, height 5' 2" (1.575 m), weight 117.663 kg (259 lb 6.4 oz), SpO2 100.00%. Physical  Exam  Constitutional: She is oriented to person, place, and time. No distress.  Eyes: No scleral icterus.  Neck: JVD present.  Cardiovascular: Normal rate and regular rhythm.   Respiratory: No respiratory distress. She has no wheezes.  GI: There is no tenderness. There is no rebound.  Musculoskeletal: She exhibits edema.  Neurological: She is alert and oriented to person, place, and time.    Assessment/Plan: Problem #1 hypoxemia/difficulty breathing: Presently patient on oxygen. Possibly secondary to CHF however obstructive sleep apnea and pneumonia need to be entertained as an additional possibility. Problem #2 end-stage renal disease she status post hemodialysis on Friday. Presently patient does not have any nausea or vomiting. Problem #3 hypertension: Her blood pressure is reasonably controlled Problem #4 history of diabetes Problem #5 history of gastroparesis Problem #6 history of healthcare associated pneumonia Problem #7 anemia: Her hemoglobin and hematocrit is without target goal Problem #8 metabolic bone disease: Her calcium is range. Plan: We'll make arrangements for patient to get dialysis today We'll try to about 3 L if her blood pressure tolerates. We'll use 3K/2.5 calcium bath Will dialyze her for 3 hours and 45 minutes which is her regular schedule.  , S 02/28/2014, 9:52 AM

## 2014-02-28 NOTE — Progress Notes (Addendum)
PT Oxygen saturation dropped down to 70's on CPAP 12.5 , room air. Checked machine and noted oxygen tubing had come loose from CPAP. Replaced and oxygen saturation came up to 95. PT is on auto titrate CPAP.

## 2014-02-28 NOTE — Progress Notes (Addendum)
Pt's O2sat is dropping to 85% on C-Pap. Pt is alert and oriented. Face mask checked. C-pap seems to be functioning at this time. Respiratory called.

## 2014-02-28 NOTE — Progress Notes (Signed)
[facility] PROGRESS NOTE  Kayla Gibson [id number] DOB: Jan 28, 1961 DOA: 02/27/2014 PCP: Default, Provider, MD  Assessment/Plan: 1-acute respiratory failure with hypoxia: Appears to be multifactorial secondary to vascular congestion do to acute on chronic diastolic heart failure; healthcare associated pneumonia; Obstructive sleep apnea and obesity hypoventilation syndrome.  -Will continue broad-spectrum antibiotics for infection,  Will start Pulmicort and flutter valve -Continue oxygen supplementation as needed -Hemodialysis with intention of fluid removal to be done by renal service  -Patient advised to be compliant with low-sodium diet and also CPAP each bedtime -Will follow clinical evolution and response to current treatment  2-encephalopathy: Appears to be secondary to hypoxia/mild hypercapnia (CO2 on admission 60) -He improve after oxygen supplementation was provided and the patient used CPAP overnight -Other consideration includes pneumonia; which will continue treating as mentioned above with broad-spectrum antibiotics.  3-diabetes: Continue sliding scale insulin and Lantus -Will check A1c   4-end-stage renal disease: Continue hemodialysis. Renal service is on board  5-hyperparathyroidism: Continue Renvela  6-hypertension: Blood pressure on admission soft.  -Will continue holding Norvasc and hydralazine.  -Resume as soon as blood pressure improved   7-obstructive sleep apnea: Continue CPAP   8-chest pain atypical: Appears to be secondary to GERD versus pneumonia.  -Troponin has been negative and CT of her chest has rule out pulmonary embolism.  -Will start PPI along with her famotidine -Continue treatment for pneumonia   Code Status: Full Family Communication: husband at bedside Disposition Plan: Home when medically stable (pneumonia control, Volume control, breathing improved)   Consultants:  Renal service  Procedures:  See below for x-ray  reports  Antibiotics:  Vancomycin  Cefepime  HPI/Subjective: Feeling better, low grade fever present in the morning; reports that her breathing is improving and the patient is now alert, awake and oriented x3.  Objective: Filed Vitals:   02/28/14 1500  BP: 108/52  Pulse: 75  Temp:   Resp:    No intake or output data in the 24 hours ending 02/28/14 1526 Filed Weights   02/27/14 2034  Weight: 117.663 kg (259 lb 6.4 oz)    Exam:   General:  Alert, awake and oriented x3; low-grade fever early this morning; denies chest pain and endorses improvement in her breathing.  Cardiovascular: S1 and S2, no rubs, no gallops. No JVD appreciated on exam  Respiratory: Scattered rhonchi, mild expiratory wheezing; bibasilar crackles  Abdomen: Obese, soft, nontender, nondistended, positive bowel sounds  Musculoskeletal: 1-2+ lower extremity edema, no cyanosis, no clubbing.  Data Reviewed: Basic Metabolic Panel:  Recent Labs Lab 02/27/14 1446 02/27/14 2028  NA 137  --   K 4.3  --   CL 93*  --   CO2 30  --   GLUCOSE 221*  --   BUN 27*  --   CREATININE 6.46* 6.65*  CALCIUM 8.5  --    Liver Function Tests:  Recent Labs Lab 02/27/14 1446  AST 31  ALT 20  ALKPHOS 109  BILITOT 0.6  PROT 8.3  ALBUMIN 3.8    Recent Labs Lab 02/27/14 1903  AMMONIA 27   CBC:  Recent Labs Lab 02/27/14 1446 02/27/14 2028  WBC 9.8 10.0  NEUTROABS 7.4  --   HGB 10.6* 10.1*  HCT 33.1* 31.1*  MCV 95.7 96.0  PLT 197 188   Cardiac Enzymes:  Recent Labs Lab 02/27/14 1446 02/27/14 2028 02/28/14 0147  TROPONINI <0.30 <0.30 <0.30   BNP (last 3 results)  Recent Labs  02/27/14 1446  PROBNP 429.8*   CBG:  Recent Labs Lab 02/27/14 2012 02/28/14 0713 02/28/14 1136  GLUCAP 213* 223* 194*    Recent Results (from the past 240 hour(s))  CULTURE, BLOOD (ROUTINE X 2)     Status: None   Collection Time    02/27/14  2:46 PM      Result Value Ref Range Status   Specimen  Description RIGHT ANTECUBITAL   Final   Special Requests BOTTLES DRAWN AEROBIC ONLY 10CC   Final   Culture NO GROWTH 1 DAY   Final   Report Status PENDING   Incomplete  CULTURE, BLOOD (ROUTINE X 2)     Status: None   Collection Time    02/27/14  2:47 PM      Result Value Ref Range Status   Specimen Description BLOOD RIGHT HAND   Final   Special Requests BOTTLES DRAWN AEROBIC ONLY 10CC   Final   Culture NO GROWTH 1 DAY   Final   Report Status PENDING   Incomplete     Studies: Ct Head Wo Contrast  02/27/2014   CLINICAL DATA:  Confusion.  EXAM: CT HEAD WITHOUT CONTRAST  TECHNIQUE: Contiguous axial images were obtained from the base of the skull through the vertex without intravenous contrast.  COMPARISON:  None.  FINDINGS: No acute cortical infarct, hemorrhage, or mass lesion ispresent. Ventricles are of normal size. No significant extra-axial fluid collection is present. The paranasal sinuses andmastoid air cells are clear. The osseous skull is intact.  IMPRESSION: 1. No acute intracranial abnormalities.  Normal brain.   Electronically Signed   By: Kerby Moors M.D.   On: 02/27/2014 21:48   Ct Angio Chest Pe W/cm &/or Wo Cm  02/27/2014   CLINICAL DATA:  Right rib pain for 3 days.  Hypoxia.  EXAM: CT ANGIOGRAPHY CHEST WITH CONTRAST  TECHNIQUE: Multidetector CT imaging of the chest was performed using the standard protocol during bolus administration of intravenous contrast. Multiplanar CT image reconstructions and MIPs were obtained to evaluate the vascular anatomy.  CONTRAST:  159mL OMNIPAQUE IOHEXOL 350 MG/ML SOLN  COMPARISON:  None  FINDINGS: Dependent changes are identified within the posterior lung bases. No pleural effusion identified. No airspace consolidation or pulmonary edema noted.  Moderate cardiac enlargement. No pericardial effusion. There is a patulous and dilated thoracic esophagus. Fluid level is identified within the distal esophagus. There is anteroposterior tracheal narrowing.  There is luminal narrowing of both the right and left mainstem bronchi. No mediastinal or hilar adenopathy. The main pulmonary artery appears patent. No lobar or segmental pulmonary artery filling defects identified to suggest an acute pulmonary embolus.  Incidental imaging through the upper abdomen is unremarkable.  Review of the visualized osseous structures is on unremarkable.  Review of the MIP images confirms the above findings.  IMPRESSION: 1. No evidence for acute pulmonary embolus. 2. Moderate cardiac enlargement. 3. Patulous and dilated esophagus with distal fluid level. This is of uncertain significance but may be related to patient's gastroparesis. 4. 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 Findings may be related to patient's hypoxia.   Electronically Signed   By: Kerby Moors M.D.   On: 02/27/2014 22:03   Dg Chest Port 1 View  02/27/2014   CLINICAL DATA:  Short of breath.  Abdominal pain.  EXAM: PORTABLE CHEST - 1 VIEW  COMPARISON:  None.  FINDINGS: Heart size upper limits of normal for projection. Pulmonary vascular congestion is present with fullness of the  RIGHT hilum. Infrahilar density is present which may represent atelectasis however aspiration or pneumonia cannot be excluded. No gross pleural effusion. Monitoring leads project over the chest.  IMPRESSION: Pulmonary vascular congestion and RIGHT infrahilar airspace disease which may represent pneumonia or aspiration pneumonitis.   Electronically Signed   By: Dereck Ligas M.D.   On: 02/27/2014 15:47    Scheduled Meds: . aspirin EC  81 mg Oral q morning - 10a  . budesonide (PULMICORT) nebulizer solution  0.25 mg Nebulization BID  . calcium acetate  667 mg Oral TID  . carvedilol  25 mg Oral BID WC  . cinacalcet  30 mg Oral QPM  . clopidogrel  75 mg Oral Daily  . enoxaparin (LOVENOX) injection  30 mg Subcutaneous Q24H  . [START ON 03/01/2014]  epoetin alfa  10,000 Units Intravenous Q M,W,F-HD  . [START ON 03/01/2014] famotidine  20 mg Oral Daily  . fenofibrate  54 mg Oral Daily  . insulin aspart  0-15 Units Subcutaneous TID WC  . insulin aspart  0-5 Units Subcutaneous QHS  . insulin glargine  20 Units Subcutaneous QHS  . isosorbide mononitrate  30 mg Oral Daily  . pregabalin  75 mg Oral Daily  . QUEtiapine Fumarate  150 mg Oral QHS  . sevelamer carbonate  800 mg Oral TID WC  . simvastatin  10 mg Oral q1800  . sodium chloride  3 mL Intravenous Q12H   Continuous Infusions:   Principal Problem:   HCAP (healthcare-associated pneumonia) Active Problems:   ESRD (end stage renal disease)   HTN (hypertension)   Diabetes mellitus type II, uncontrolled   Hypoxia   Chest pain    Time spent: More than 30 minutes    Barton Dubois  [facility] Pager [phone] If 7PM-7AM, please contact night-coverage at [email or web address], password [facility] 02/28/2014, 3:26 PM  LOS: 1 day

## 2014-02-28 NOTE — Progress Notes (Signed)
Lab unable to draw morning labs after several attempts.  Left upper extremity restricted.  Lab draws only to right upper extremity.  Lab stated that they will wait and come back later to try again.  Dr. Dyann Kief notified.

## 2014-03-01 ENCOUNTER — Inpatient Hospital Stay (HOSPITAL_COMMUNITY): Payer: Medicare Other

## 2014-03-01 ENCOUNTER — Inpatient Hospital Stay (HOSPITAL_COMMUNITY)
Admit: 2014-03-01 | Discharge: 2014-03-01 | Disposition: A | Discharge status: Home / Self Care | Payer: Medicare Other | Attending: Neurology | Admitting: Neurology

## 2014-03-01 DIAGNOSIS — H510 Palsy (spasm) of conjugate gaze: Secondary | ICD-10-CM

## 2014-03-01 LAB — HEMOGLOBIN A1C
Hgb A1c MFr Bld: 7.7 % — ABNORMAL HIGH (ref <5.7)
Mean Plasma Glucose: 174 mg/dL — ABNORMAL HIGH (ref <117)

## 2014-03-01 LAB — GLUCOSE, CAPILLARY
GLUCOSE-CAPILLARY: 188 mg/dL — AB (ref 70–99)
GLUCOSE-CAPILLARY: 217 mg/dL — AB (ref 70–99)
Glucose-Capillary: 233 mg/dL — ABNORMAL HIGH (ref 70–99)
Glucose-Capillary: 197 mg/dL — ABNORMAL HIGH (ref 70–99)
Glucose-Capillary: 217 mg/dL — ABNORMAL HIGH (ref 70–99)

## 2014-03-01 LAB — BLOOD GAS, ARTERIAL
ACID-BASE EXCESS: 3.2 mmol/L — AB (ref 0.0–2.0)
BICARBONATE: 28.3 meq/L — AB (ref 20.0–24.0)
FIO2: 0.24 %
O2 Content: 1 L/min
O2 Saturation: 97.8 %
PCO2 ART: 52.3 mmHg — AB (ref 35.0–45.0)
PH ART: 7.353 (ref 7.350–7.450)
PO2 ART: 109 mmHg — AB (ref 80.0–100.0)
Patient temperature: 37
TCO2: 26.7 mmol/L (ref 0–100)

## 2014-03-01 LAB — CBC
HCT: 29.4 % — ABNORMAL LOW (ref 36.0–46.0)
HEMOGLOBIN: 9.6 g/dL — AB (ref 12.0–15.0)
MCH: 31 pg (ref 26.0–34.0)
MCHC: 32.7 g/dL (ref 30.0–36.0)
MCV: 94.8 fL (ref 78.0–100.0)
Platelets: 165 10*3/uL (ref 150–400)
RBC: 3.1 MIL/uL — AB (ref 3.87–5.11)
RDW: 14.5 % (ref 11.5–15.5)
WBC: 7.5 10*3/uL (ref 4.0–10.5)

## 2014-03-01 LAB — BASIC METABOLIC PANEL
Anion gap: 13 (ref 5–15)
BUN: 22 mg/dL (ref 6–23)
CHLORIDE: 94 meq/L — AB (ref 96–112)
CO2: 30 mEq/L (ref 19–32)
Calcium: 8.7 mg/dL (ref 8.4–10.5)
Creatinine, Ser: 5.71 mg/dL — ABNORMAL HIGH (ref 0.50–1.10)
GFR calc Af Amer: 9 mL/min — ABNORMAL LOW (ref >90)
GFR calc non Af Amer: 8 mL/min — ABNORMAL LOW (ref >90)
Glucose, Bld: 224 mg/dL — ABNORMAL HIGH (ref 70–99)
POTASSIUM: 4.3 meq/L (ref 3.7–5.3)
Sodium: 137 mEq/L (ref 137–147)

## 2014-03-01 LAB — PHOSPHORUS: Phosphorus: 4.3 mg/dL (ref 2.3–4.6)

## 2014-03-01 LAB — HEPATITIS B SURFACE ANTIGEN: HEP B S AG: NEGATIVE

## 2014-03-01 LAB — PROCALCITONIN: Procalcitonin: 0.66 ng/mL

## 2014-03-01 MED ORDER — EPOETIN ALFA 10000 UNIT/ML IJ SOLN
10000.0000 [IU] | INTRAMUSCULAR | Status: DC
  Administered 2014-03-02 – 2014-03-04 (×2): 10000 [IU] via INTRAVENOUS
  Filled 2014-03-01 (×2): qty 1

## 2014-03-01 MED ORDER — LORATADINE 10 MG PO TABS
10.0000 mg | ORAL_TABLET | Freq: Every day | ORAL | Status: DC
  Administered 2014-03-02 – 2014-03-04 (×3): 10 mg via ORAL
  Filled 2014-03-01 (×3): qty 1

## 2014-03-01 MED ORDER — VANCOMYCIN HCL IN DEXTROSE 1-5 GM/200ML-% IV SOLN
1000.0000 mg | INTRAVENOUS | Status: DC
  Administered 2014-03-02 – 2014-03-04 (×2): 1000 mg via INTRAVENOUS
  Filled 2014-03-01 (×2): qty 200

## 2014-03-01 MED ORDER — ONDANSETRON 4 MG PO TBDP
8.0000 mg | ORAL_TABLET | Freq: Three times a day (TID) | ORAL | Status: DC | PRN
  Filled 2014-03-01: qty 2

## 2014-03-01 MED ORDER — DEXTROSE 5 % IV SOLN
2.0000 g | INTRAVENOUS | Status: DC
  Administered 2014-03-02 – 2014-03-04 (×2): 2 g via INTRAVENOUS
  Filled 2014-03-01 (×2): qty 2

## 2014-03-01 NOTE — Progress Notes (Addendum)
Subjective: Interval History: has no complaint of of difficulty in breathing. Her chest pain is better and she does not any nausea or vomitng.  Objective: Vital signs in last 24 hours: Temp:  [97.9 F (36.6 C)-98.7 F (37.1 C)] 98.7 F (37.1 C) (09/13 2017) Pulse Rate:  [73-88] 85 (09/14 0544) Resp:  [16-20] 20 (09/14 0544) BP: (86-159)/(37-66) 159/66 mmHg (09/14 0544) SpO2:  [94 %-100 %] 100 % (09/14 0544) Weight change:   Intake/Output from previous day: 09/13 0701 - 09/14 0700 In: -  Out: 2530  Intake/Output this shift:   She is alert and no apparent distress Chest is clear Heart RRR no murmur Abdomen: Obese and none tender Extremities: No edema Lab Results:  Recent Labs  02/27/14 2028 03/01/14 0856  WBC 10.0 7.5  HGB 10.1* 9.6*  HCT 31.1* 29.4*  PLT 188 165   BMET:  Recent Labs  02/28/14 1445 03/01/14 0856  NA 132* 137  K 3.9 4.3  CL 90* 94*  CO2 28 30  GLUCOSE 200* 224*  BUN 38* 22  CREATININE 8.12* 5.71*  CALCIUM 8.4 8.7   No results found for this basename: PTH,  in the last 72 hours Iron Studies: No results found for this basename: IRON, TIBC, TRANSFERRIN, FERRITIN,  in the last 72 hours  Studies/Results: Ct Head Wo Contrast  02/27/2014   CLINICAL DATA:  Confusion.  EXAM: CT HEAD WITHOUT CONTRAST  TECHNIQUE: Contiguous axial images were obtained from the base of the skull through the vertex without intravenous contrast.  COMPARISON:  None.  FINDINGS: No acute cortical infarct, hemorrhage, or mass lesion ispresent. Ventricles are of normal size. No significant extra-axial fluid collection is present. The paranasal sinuses andmastoid air cells are clear. The osseous skull is intact.  IMPRESSION: 1. No acute intracranial abnormalities.  Normal brain.   Electronically Signed   By: Kerby Moors M.D.   On: 02/27/2014 21:48   Ct Angio Chest Pe W/cm &/or Wo Cm  02/27/2014   CLINICAL DATA:  Right rib pain for 3 days.  Hypoxia.  EXAM: CT ANGIOGRAPHY CHEST  WITH CONTRAST  TECHNIQUE: Multidetector CT imaging of the chest was performed using the standard protocol during bolus administration of intravenous contrast. Multiplanar CT image reconstructions and MIPs were obtained to evaluate the vascular anatomy.  CONTRAST:  142mL OMNIPAQUE IOHEXOL 350 MG/ML SOLN  COMPARISON:  None  FINDINGS: Dependent changes are identified within the posterior lung bases. No pleural effusion identified. No airspace consolidation or pulmonary edema noted.  Moderate cardiac enlargement. No pericardial effusion. There is a patulous and dilated thoracic esophagus. Fluid level is identified within the distal esophagus. There is anteroposterior tracheal narrowing. There is luminal narrowing of both the right and left mainstem bronchi. No mediastinal or hilar adenopathy. The main pulmonary artery appears patent. No lobar or segmental pulmonary artery filling defects identified to suggest an acute pulmonary embolus.  Incidental imaging through the upper abdomen is unremarkable.  Review of the visualized osseous structures is on unremarkable.  Review of the MIP images confirms the above findings.  IMPRESSION: 1. No evidence for acute pulmonary embolus. 2. Moderate cardiac enlargement. 3. Patulous and dilated esophagus with distal fluid level. This is of uncertain significance but may be related to patient's gastroparesis. 4. 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 Findings may be related to patient's hypoxia.   Electronically Signed   By: Kerby Moors M.D.   On: 02/27/2014 22:03  Dg Chest Port 1 View  02/27/2014   CLINICAL DATA:  Short of breath.  Abdominal pain.  EXAM: PORTABLE CHEST - 1 VIEW  COMPARISON:  None.  FINDINGS: Heart size upper limits of normal for projection. Pulmonary vascular congestion is present with fullness of the RIGHT hilum. Infrahilar density is present which may represent atelectasis  however aspiration or pneumonia cannot be excluded. No gross pleural effusion. Monitoring leads project over the chest.  IMPRESSION: Pulmonary vascular congestion and RIGHT infrahilar airspace disease which may represent pneumonia or aspiration pneumonitis.   Electronically Signed   By: Dereck Ligas M.D.   On: 02/27/2014 15:47    I have reviewed the patient's current medications.  Assessment/Plan: Problem #1 Right chest/flank pain has improved Problem#2 Hypoxemia: Possibly a combination of pneumonia/sleep apnea/CHF. Patient presently of oxygen. She is s/p hemodialysis yesterday with fluid removal feeling better  Problem#3 Hypertension: Her blood pressure is reasanably controlled Problem#4 Anemia: Her hemoglobin is below our target goal Problem#5 Metabolic bone disease: Her calcium and phosphorus in range Problem#6 ESRD: patient does not have uremic sign and symptoms and her potassium is normal Problem#7 Obesity Plan: For hemodialysis in am We will try to remove 3 liters in am Use 3 k /2.5 ca bath   LOS: 2 days   Melinna Linarez S 03/01/2014,9:38 AM

## 2014-03-01 NOTE — Progress Notes (Signed)
Pt on CPAP of 8 with 2lpm  Pt tolerate well will continue to monitor.

## 2014-03-01 NOTE — Progress Notes (Signed)
Inpatient Diabetes Program Recommendations  AACE/ADA: New Consensus Statement on Inpatient Glycemic Control (2013)  Target Ranges:  Prepandial:   less than 140 mg/dL      Peak postprandial:   less than 180 mg/dL (1-2 hours)      Critically ill patients:  140 - 180 mg/dL   Results for CALLOWAY, SABELLA (MRN [id number]) as of 03/01/2014 09:13  Ref. Range 02/28/2014 07:13 02/28/2014 11:36 02/28/2014 19:04 02/28/2014 20:15 03/01/2014 08:05  Glucose-Capillary Latest Range: 70-99 mg/dL 223 (H) 194 (H) 171 (H) 193 (H) 233 (H)   Diabetes history: DM2 Outpatient Diabetes medications: Lantus 25 units QHS, Humalog 15 units TID with meals Current orders for Inpatient glycemic control: Lantus 20 units QHS, Novolog 0-15 units AC, Novolog 0-5 units HS  Inpatient Diabetes Program Recommendations Insulin - Basal: Please consider increasing Lantus to 22 units QHS. Insulin - Meal Coverage: Please consider ordering Novolog 3 units TID wtih meals for meal coverage.  Thanks, Barnie Alderman, RN, MSN, CCRN Diabetes Coordinator Inpatient Diabetes Program [phone] (Team Pager) [phone] ([facility] office) [phone] [facility] office)

## 2014-03-01 NOTE — Progress Notes (Signed)
[facility] PROGRESS NOTE  Arti Kose [id number] DOB: 1961-06-09 DOA: 02/27/2014 PCP: Default, Provider, MD  Assessment/Plan: 1-acute respiratory failure with hypoxia: Appears to be multifactorial secondary to vascular congestion do to acute on chronic diastolic heart failure; healthcare associated pneumonia; Obstructive sleep apnea and obesity hypoventilation syndrome.  -Will continue broad-spectrum antibiotics for infection,  Will continue Pulmicort and flutter valve -Continue oxygen supplementation as needed -Hemodialysis with intention of fluid removal to be continue by renal service  -Patient advised to be compliant with low-sodium diet and also CPAP each bedtime -Will follow clinical evolution and response to current treatment  2-encephalopathy: Appears to be secondary to hypoxia/mild hypercapnia (CO2 on admission 60) -mentation improve after oxygen supplementation was provided and the patient has been compliant using CPAP at night -Other consideration includes pneumonia; which will continue treating as mentioned above with broad-spectrum antibiotics.  3-diabetes: Continue sliding scale insulin and Lantus -A1c 7.7  4-end-stage renal disease: Continue hemodialysis. Renal service is on board  5-hyperparathyroidism: Continue Renvela  6-hypertension: Blood pressure stable/soft.  -Will continue holding Norvasc and hydralazine.  -Resume as soon as blood pressure improved   7-obstructive sleep apnea: Continue CPAP   8-chest pain atypical: Appears to be secondary to GERD versus pneumonia.  -Troponin has been negative and CT of her chest has rule out pulmonary embolism.  -Will start PPI along with her famotidine -Continue treatment for pneumonia   9-spacing, lateral gaze and partial unresponsiveness: CT stat ordered and negative for acute abnormalities; ABG with normal CO2/O2; patient is now normal. Husband expressed this are exactly the same episodes she experienced at  home and sometimes have associated muscular shaking. -will check EEG  -due to presumed TIA; will continue ASA and plavix  Code Status: Full Family Communication: husband at bedside Disposition Plan: Home when medically stable (pneumonia control, Volume control, breathing improved)   Consultants:  Renal service  Procedures:  See below for x-ray reports  Antibiotics:  Vancomycin  Cefepime  HPI/Subjective: Patient with episode of unresponsiveness, lateral gaze and spacing; spontaneously resolved and no focal deficit appreciated after event.  Objective: Filed Vitals:   03/01/14 0956  BP: 145/63  Pulse: 86  Temp: 98.2 F (36.8 C)  Resp: 18    Intake/Output Summary (Last 24 hours) at 03/01/14 1820 Last data filed at 03/01/14 1233  Gross per 24 hour  Intake    360 ml  Output   2530 ml  Net  -2170 ml   Filed Weights   02/27/14 2034  Weight: 117.663 kg (259 lb 6.4 oz)    Exam:   General:  Patient with episode of unresponsiveness, lateral gaze and spacing; spontaneously resolved and no focal deficit appreciated after event.  Cardiovascular: S1 and S2, no rubs, no gallops. No JVD appreciated on exam  Respiratory: improved air movement, no expiratory wheezing; very mild bibasilar crackles  Abdomen: Obese, soft, nontender, nondistended, positive bowel sounds  Musculoskeletal: 1++ lower extremity edema, no cyanosis, no clubbing.  Data Reviewed: Basic Metabolic Panel:  Recent Labs Lab 02/27/14 1446 02/27/14 2028 02/28/14 1445 03/01/14 0856  NA 137  --  132* 137  K 4.3  --  3.9 4.3  CL 93*  --  90* 94*  CO2 30  --  28 30  GLUCOSE 221*  --  200* 224*  BUN 27*  --  38* 22  CREATININE 6.46* 6.65* 8.12* 5.71*  CALCIUM 8.5  --  8.4 8.7  PHOS  --   --   --  4.3  Liver Function Tests:  Recent Labs Lab 02/27/14 1446 02/28/14 1445  AST 31 22  ALT 20 15  ALKPHOS 109 94  BILITOT 0.6 0.4  PROT 8.3 7.5  ALBUMIN 3.8 3.4*    Recent Labs Lab  02/27/14 1903  AMMONIA 27   CBC:  Recent Labs Lab 02/27/14 1446 02/27/14 2028 03/01/14 0856  WBC 9.8 10.0 7.5  NEUTROABS 7.4  --   --   HGB 10.6* 10.1* 9.6*  HCT 33.1* 31.1* 29.4*  MCV 95.7 96.0 94.8  PLT 197 188 165   Cardiac Enzymes:  Recent Labs Lab 02/27/14 1446 02/27/14 2028 02/28/14 0147 02/28/14 1445  TROPONINI <0.30 <0.30 <0.30 <0.30   BNP (last 3 results)  Recent Labs  02/27/14 1446  PROBNP 429.8*   CBG:  Recent Labs Lab 02/28/14 2015 03/01/14 0805 03/01/14 1157 03/01/14 1241 03/01/14 1648  GLUCAP 193* 233* 197* 188* 217*    Recent Results (from the past 240 hour(s))  CULTURE, BLOOD (ROUTINE X 2)     Status: None   Collection Time    02/27/14  2:46 PM      Result Value Ref Range Status   Specimen Description RIGHT ANTECUBITAL   Final   Special Requests BOTTLES DRAWN AEROBIC ONLY 10CC   Final   Culture NO GROWTH 2 DAYS   Final   Report Status PENDING   Incomplete  CULTURE, BLOOD (ROUTINE X 2)     Status: None   Collection Time    02/27/14  2:47 PM      Result Value Ref Range Status   Specimen Description BLOOD RIGHT HAND   Final   Special Requests BOTTLES DRAWN AEROBIC ONLY 10CC   Final   Culture NO GROWTH 2 DAYS   Final   Report Status PENDING   Incomplete     Studies: Dg Chest 1 View  03/01/2014   CLINICAL DATA:  Shortness of breath.  EXAM: CHEST - 1 VIEW  COMPARISON:  02/27/2014.  FINDINGS: Examination is limited by body habitus.  The heart is mildly enlarged but stable. No definite acute pulmonary findings. The bony thorax is grossly normal.  IMPRESSION: Limited examination but no definite acute pulmonary findings.  Stable mild cardiac enlargement   Electronically Signed   By: Kalman Jewels M.D.   On: 03/01/2014 13:54   Ct Head Wo Contrast  03/01/2014   CLINICAL DATA:  Altered mental status, difficulty speaking  EXAM: CT HEAD WITHOUT CONTRAST  TECHNIQUE: Contiguous axial images were obtained from the base of the skull through the  vertex without intravenous contrast.  COMPARISON:  Noncontrast CT scan of brain dated February 27, 2014.  FINDINGS: The ventricles are normal in size and position. There is no intracranial hemorrhage nor intracranial mass effect. There is no evidence of acute ischemic change. The cerebellum and brainstem are unremarkable.  The observed paranasal sinuses and mastoid air cells exhibit no air-fluid levels. There is no acute skull fracture.  IMPRESSION: There is no acute intracranial abnormality. There has not been significant interval change since the study of February 27, 2014.   Electronically Signed   By: David  Martinique   On: 03/01/2014 14:21   Ct Head Wo Contrast  02/27/2014   CLINICAL DATA:  Confusion.  EXAM: CT HEAD WITHOUT CONTRAST  TECHNIQUE: Contiguous axial images were obtained from the base of the skull through the vertex without intravenous contrast.  COMPARISON:  None.  FINDINGS: No acute cortical infarct, hemorrhage, or mass lesion ispresent. Ventricles are of normal  size. No significant extra-axial fluid collection is present. The paranasal sinuses andmastoid air cells are clear. The osseous skull is intact.  IMPRESSION: 1. No acute intracranial abnormalities.  Normal brain.   Electronically Signed   By: Kerby Moors M.D.   On: 02/27/2014 21:48   Ct Angio Chest Pe W/cm &/or Wo Cm  02/27/2014   CLINICAL DATA:  Right rib pain for 3 days.  Hypoxia.  EXAM: CT ANGIOGRAPHY CHEST WITH CONTRAST  TECHNIQUE: Multidetector CT imaging of the chest was performed using the standard protocol during bolus administration of intravenous contrast. Multiplanar CT image reconstructions and MIPs were obtained to evaluate the vascular anatomy.  CONTRAST:  196mL OMNIPAQUE IOHEXOL 350 MG/ML SOLN  COMPARISON:  None  FINDINGS: Dependent changes are identified within the posterior lung bases. No pleural effusion identified. No airspace consolidation or pulmonary edema noted.  Moderate cardiac enlargement. No pericardial  effusion. There is a patulous and dilated thoracic esophagus. Fluid level is identified within the distal esophagus. There is anteroposterior tracheal narrowing. There is luminal narrowing of both the right and left mainstem bronchi. No mediastinal or hilar adenopathy. The main pulmonary artery appears patent. No lobar or segmental pulmonary artery filling defects identified to suggest an acute pulmonary embolus.  Incidental imaging through the upper abdomen is unremarkable.  Review of the visualized osseous structures is on unremarkable.  Review of the MIP images confirms the above findings.  IMPRESSION: 1. No evidence for acute pulmonary embolus. 2. Moderate cardiac enlargement. 3. Patulous and dilated esophagus with distal fluid level. This is of uncertain significance but may be related to patient's gastroparesis. 4. 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 Findings may be related to patient's hypoxia.   Electronically Signed   By: Kerby Moors M.D.   On: 02/27/2014 22:03    Scheduled Meds: . aspirin EC  81 mg Oral q morning - 10a  . budesonide (PULMICORT) nebulizer solution  0.25 mg Nebulization BID  . calcium acetate  667 mg Oral TID  . carvedilol  25 mg Oral BID WC  . [START ON 03/02/2014] ceFEPime (MAXIPIME) IV  2 g Intravenous Q T,Th,Sa-HD  . cinacalcet  30 mg Oral QPM  . clopidogrel  75 mg Oral Daily  . enoxaparin (LOVENOX) injection  30 mg Subcutaneous Q24H  . [START ON 03/02/2014] epoetin alfa  10,000 Units Intravenous Q T,Th,Sa-HD  . famotidine  20 mg Oral Daily  . fenofibrate  54 mg Oral Daily  . insulin aspart  0-15 Units Subcutaneous TID WC  . insulin aspart  0-5 Units Subcutaneous QHS  . insulin glargine  20 Units Subcutaneous QHS  . isosorbide mononitrate  30 mg Oral Daily  . pantoprazole  40 mg Oral Daily  . pregabalin  75 mg Oral Daily  . QUEtiapine Fumarate  150 mg Oral QHS  . sevelamer  carbonate  800 mg Oral TID WC  . simvastatin  10 mg Oral q1800  . sodium chloride  3 mL Intravenous Q12H  . [START ON 03/02/2014] vancomycin  1,000 mg Intravenous Q T,Th,Sa-HD   Continuous Infusions:   Principal Problem:   HCAP (healthcare-associated pneumonia) Active Problems:   ESRD (end stage renal disease)   HTN (hypertension)   Diabetes mellitus type II, uncontrolled   Hypoxia   Chest pain    Time spent: More than 30 minutes    Barton Dubois  [facility] Pager [phone] If 7PM-7AM, please contact night-coverage at  [email or web address], password [facility] 03/01/2014, 6:20 PM  LOS: 2 days

## 2014-03-01 NOTE — Progress Notes (Signed)
ANTIBIOTIC CONSULT NOTE - follow up  Pharmacy Consult for Vancomycin and renal dose adjustment of antibiotics  Indication: pneumonia  Allergies  Allergen Reactions  . Dilaudid [Hydromorphone Hcl] Anaphylaxis  . Hydrocodone-Acetaminophen Anaphylaxis  . Tylenol [Acetaminophen] Anaphylaxis  . Ace Inhibitors   . Alprazolam Other (See Comments)    Hallucination  . Bee Venom Swelling  . Zolpidem Tartrate Other (See Comments)    Hallucinations   Patient Measurements: Height: 5\' 2"  (157.5 cm) Weight: 259 lb 6.4 oz (117.663 kg) IBW/kg (Calculated) : 50.1  Vital Signs: Temp: 98.2 F (36.8 C) (09/14 0956) Temp src: Oral (09/14 0956) BP: 145/63 mmHg (09/14 0956) Pulse Rate: 86 (09/14 0956) Intake/Output from previous day: 09/13 0701 - 09/14 0700 In: -  Out: 2530  Intake/Output from this shift:    Labs:  Recent Labs  02/27/14 1446 02/27/14 2028 02/28/14 1445 03/01/14 0856  WBC 9.8 10.0  --  7.5  HGB 10.6* 10.1*  --  9.6*  PLT 197 188  --  165  CREATININE 6.46* 6.65* 8.12* 5.71*   Estimated Creatinine Clearance: 14.2 ml/min (by C-G formula based on Cr of 5.71). No results found for this basename: VANCOTROUGH, VANCOPEAK, VANCORANDOM, [place], GENTPEAK, GENTRANDOM, TOBRATROUGH, TOBRAPEAK, TOBRARND, AMIKACINPEAK, AMIKACINTROU, AMIKACIN,  in the last 72 hours   Microbiology: Recent Results (from the past 720 hour(s))  CULTURE, BLOOD (ROUTINE X 2)     Status: None   Collection Time    02/27/14  2:46 PM      Result Value Ref Range Status   Specimen Description RIGHT ANTECUBITAL   Final   Special Requests BOTTLES DRAWN AEROBIC ONLY 10CC   Final   Culture NO GROWTH 2 DAYS   Final   Report Status PENDING   Incomplete  CULTURE, BLOOD (ROUTINE X 2)     Status: None   Collection Time    02/27/14  2:47 PM      Result Value Ref Range Status   Specimen Description BLOOD RIGHT HAND   Final   Special Requests BOTTLES DRAWN AEROBIC ONLY 10CC   Final   Culture NO GROWTH 2 DAYS    Final   Report Status PENDING   Incomplete   Medical History: Past Medical History  Diagnosis Date  . Diabetes mellitus   . Osteomyelitis 2009  . Hypertension   . ESRD on hemodialysis   . Gastroparesis   . Depression   . 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   Anti-infectives   Start     Dose/Rate Route Frequency Ordered Stop   02/28/14 1200  cefTRIAXone (ROCEPHIN) 1 g in dextrose 5 % 50 mL IVPB  Status:  Discontinued     1 g 100 mL/hr over 30 Minutes Intravenous Every 24 hours 02/27/14 1954 02/28/14 1525   02/28/14 1100  azithromycin (ZITHROMAX) 500 mg in dextrose 5 % 250 mL IVPB  Status:  Discontinued     500 mg 250 mL/hr over 60 Minutes Intravenous Every 24 hours 02/27/14 1954 02/28/14 1525   02/27/14 2200  ceFEPIme (MAXIPIME) 1 g in dextrose 5 % 50 mL IVPB  Status:  Discontinued     1 g 100 mL/hr over 30 Minutes Intravenous 3 times per day 02/27/14 1959 02/27/14 2034   02/27/14 2200  vancomycin (VANCOCIN) 2,000 mg in sodium chloride 0.9 % 500 mL IVPB     2,000 mg 250 mL/hr over 120 Minutes Intravenous  Once 02/27/14 2036 02/28/14 0000   02/27/14 2100  ceFEPIme (MAXIPIME) 2 g in dextrose 5 %  50 mL IVPB     2 g 100 mL/hr over 30 Minutes Intravenous  Once 02/27/14 2035 02/27/14 2337   02/27/14 1445  cefTRIAXone (ROCEPHIN) 1 g in dextrose 5 % 50 mL IVPB     1 g 100 mL/hr over 30 Minutes Intravenous  Once 02/27/14 1437 02/27/14 1718   02/27/14 1445  azithromycin (ZITHROMAX) 500 mg in dextrose 5 % 250 mL IVPB     500 mg 250 mL/hr over 60 Minutes Intravenous  Once 02/27/14 1437 02/27/14 1820     Assessment: 53yo female with ESRD requiring dialysis.  Admitted with presumed pneumonia.  Pt received Rocephin and Zithromax on admission.  Pt now has orders for Cefepime and Vancomycin.    Goal of Therapy:  Pre-Hemodialysis Vancomycin level goal range =15-25 mcg/ml  Plan:  Vancomycin 1000mg  IV after each dialysis Cefepime 2000mg  IV IV after each dialysis Monitor labs, progress, and  cultures  Hart Robinsons A 03/01/2014,10:19 AM

## 2014-03-01 NOTE — Progress Notes (Signed)
EEG Completed; Results Pending  

## 2014-03-02 DIAGNOSIS — K3184 Gastroparesis: Secondary | ICD-10-CM

## 2014-03-02 LAB — BASIC METABOLIC PANEL
Anion gap: 14 (ref 5–15)
BUN: 31 mg/dL — ABNORMAL HIGH (ref 6–23)
CALCIUM: 8.6 mg/dL (ref 8.4–10.5)
CO2: 29 meq/L (ref 19–32)
CREATININE: 7.5 mg/dL — AB (ref 0.50–1.10)
Chloride: 95 mEq/L — ABNORMAL LOW (ref 96–112)
GFR calc Af Amer: 7 mL/min — ABNORMAL LOW (ref >90)
GFR calc non Af Amer: 6 mL/min — ABNORMAL LOW (ref >90)
Glucose, Bld: 152 mg/dL — ABNORMAL HIGH (ref 70–99)
Potassium: 4.2 mEq/L (ref 3.7–5.3)
Sodium: 138 mEq/L (ref 137–147)

## 2014-03-02 LAB — GLUCOSE, CAPILLARY
Glucose-Capillary: 167 mg/dL — ABNORMAL HIGH (ref 70–99)
Glucose-Capillary: 180 mg/dL — ABNORMAL HIGH (ref 70–99)
Glucose-Capillary: 297 mg/dL — ABNORMAL HIGH (ref 70–99)

## 2014-03-02 MED ORDER — SODIUM CHLORIDE 0.9 % IV SOLN: 100.0000 mL | INTRAVENOUS | Status: DC | PRN

## 2014-03-02 MED ORDER — ALTEPLASE 2 MG IJ SOLR
2.0000 mg | Freq: Once | INTRAMUSCULAR | Status: AC | PRN
Start: 2014-03-02 — End: 2014-03-02
  Filled 2014-03-02: qty 2

## 2014-03-02 NOTE — Progress Notes (Addendum)
[facility] PROGRESS NOTE  Kayla Gibson [id number] DOB: March 20, 1961 DOA: 02/27/2014 PCP: Default, Provider, MD  Assessment/Plan: 1-acute respiratory failure with hypoxia: Appears to be multifactorial secondary to vascular congestion do to acute on chronic diastolic heart failure; healthcare associated pneumonia; Obstructive sleep apnea and obesity hypoventilation syndrome.  -Will continue broad-spectrum antibiotics for infection,  Will continue Pulmicort and flutter valve -Continue oxygen supplementation as needed, wean as tolerated  -Hemodialysis with intention of fluid removal to be continue by renal service; appreciate assistance and rec's -Patient advised to be compliant with low-sodium diet and also CPAP every night -Will follow clinical evolution  -potentially home on 9/16 if ok with renal service; antibiotics can continue during her HD treatments in outpatient setting until 9/22 -will benefit of adjustment to her dry weight. -advise to lose weight  2-encephalopathy: Appears to be secondary to hypoxia/mild hypercapnia (CO2 on admission 60) -mentation improve after oxygen supplementation was provided and the patient has been compliant using CPAP at night while hospitalized  -Other consideration includes pneumonia; which will continue treating as mentioned above with broad-spectrum antibiotics. -EEG negative -TSH and B12 pending   3-diabetes: Continue sliding scale insulin and Lantus -A1c 7.7  4-end-stage renal disease: Continue hemodialysis. Renal service is on board  5-hyperparathyroidism: Continue Renvela  6-hypertension: Blood pressure stable/soft.  -Will continue holding Norvasc and hydralazine.  -Resume as soon as blood pressure improved -pretty much managed with HD currently -will monitor   7-obstructive sleep apnea: Continue CPAP QHS  8-chest pain atypical: Appears to be secondary to GERD and pneumonia.  -Troponin has been negative and CT of her chest has  rule out pulmonary embolism.  -Will continue PPI along with her famotidine -Continue treatment for pneumonia   9-spacing, lateral gaze and partial unresponsiveness: CT stat ordered and negative for acute abnormalities; ABG with normal CO2/O2; patient is back to normal.  -EEG unremarkable  -due to presumed TIA; will continue ASA and plavix and follow closely.  10-Gastroparesis: continue metoclopramide   Code Status: Full Family Communication: husband at bedside Disposition Plan: Home when medically stable (pneumonia control, Volume control, breathing improved); most likely home in 1-2 days.   Consultants:  Renal service  Procedures:  See below for x-ray reports  Antibiotics:  Vancomycin 9/12  Cefepime 9/12  HPI/Subjective: Patient feeling better, no further episodes of unresponsiveness; denies CP and endorses breathing is significantly improved.  Objective: Filed Vitals:   03/02/14 1816  BP: 133/60  Pulse: 88  Temp: 98.7 F (37.1 C)  Resp: 20    Intake/Output Summary (Last 24 hours) at 03/02/14 1836 Last data filed at 03/02/14 1700  Gross per 24 hour  Intake    240 ml  Output   2652 ml  Net  -2412 ml   Filed Weights   02/27/14 2034  Weight: 117.663 kg (259 lb 6.4 oz)    Exam:   General:  Patient without further episodes of unresponsiveness, lateral gaze and/or spacing; reproting feeling a whole lot better and breathing easier. Denies CP.  Cardiovascular: S1 and S2, no rubs, no gallops. No JVD appreciated on exam (but difficult to assess due to body habitus)  Respiratory: improved air movement, no expiratory wheezing; faint bibasilar crackles  Abdomen: Obese, soft, nontender, nondistended, positive bowel sounds  Musculoskeletal: 1++ lower extremity edema, no cyanosis, no clubbing.  Data Reviewed: Basic Metabolic Panel:  Recent Labs Lab 02/27/14 1446 02/27/14 2028 02/28/14 1445 03/01/14 0856 03/02/14 0617  NA 137  --  132* 137 138  K 4.3  --  3.9 4.3 4.2  CL 93*  --  90* 94* 95*  CO2 30  --  28 30 29   GLUCOSE 221*  --  200* 224* 152*  BUN 27*  --  38* 22 31*  CREATININE 6.46* 6.65* 8.12* 5.71* 7.50*  CALCIUM 8.5  --  8.4 8.7 8.6  PHOS  --   --   --  4.3  --    Liver Function Tests:  Recent Labs Lab 02/27/14 1446 02/28/14 1445  AST 31 22  ALT 20 15  ALKPHOS 109 94  BILITOT 0.6 0.4  PROT 8.3 7.5  ALBUMIN 3.8 3.4*    Recent Labs Lab 02/27/14 1903  AMMONIA 27   CBC:  Recent Labs Lab 02/27/14 1446 02/27/14 2028 03/01/14 0856  WBC 9.8 10.0 7.5  NEUTROABS 7.4  --   --   HGB 10.6* 10.1* 9.6*  HCT 33.1* 31.1* 29.4*  MCV 95.7 96.0 94.8  PLT 197 188 165   Cardiac Enzymes:  Recent Labs Lab 02/27/14 1446 02/27/14 2028 02/28/14 0147 02/28/14 1445  TROPONINI <0.30 <0.30 <0.30 <0.30   BNP (last 3 results)  Recent Labs  02/27/14 1446  PROBNP 429.8*   CBG:  Recent Labs Lab 03/01/14 1241 03/01/14 1648 03/01/14 2051 03/02/14 0725 03/02/14 1632  GLUCAP 188* 217* 217* 167* 180*    Recent Results (from the past 240 hour(s))  CULTURE, BLOOD (ROUTINE X 2)     Status: None   Collection Time    02/27/14  2:46 PM      Result Value Ref Range Status   Specimen Description BLOOD RIGHT ANTECUBITAL   Final   Special Requests BOTTLES DRAWN AEROBIC ONLY 10CC   Final   Culture NO GROWTH 3 DAYS   Final   Report Status PENDING   Incomplete  CULTURE, BLOOD (ROUTINE X 2)     Status: None   Collection Time    02/27/14  2:47 PM      Result Value Ref Range Status   Specimen Description BLOOD RIGHT HAND   Final   Special Requests BOTTLES DRAWN AEROBIC ONLY 10CC   Final   Culture NO GROWTH 3 DAYS   Final   Report Status PENDING   Incomplete     Studies: Dg Chest 1 View  03/01/2014   CLINICAL DATA:  Shortness of breath.  EXAM: CHEST - 1 VIEW  COMPARISON:  02/27/2014.  FINDINGS: Examination is limited by body habitus.  The heart is mildly enlarged but stable. No definite acute pulmonary findings. The bony  thorax is grossly normal.  IMPRESSION: Limited examination but no definite acute pulmonary findings.  Stable mild cardiac enlargement   Electronically Signed   By: Kalman Jewels M.D.   On: 03/01/2014 13:54   Ct Head Wo Contrast  03/01/2014   CLINICAL DATA:  Altered mental status, difficulty speaking  EXAM: CT HEAD WITHOUT CONTRAST  TECHNIQUE: Contiguous axial images were obtained from the base of the skull through the vertex without intravenous contrast.  COMPARISON:  Noncontrast CT scan of brain dated February 27, 2014.  FINDINGS: The ventricles are normal in size and position. There is no intracranial hemorrhage nor intracranial mass effect. There is no evidence of acute ischemic change. The cerebellum and brainstem are unremarkable.  The observed paranasal sinuses and mastoid air cells exhibit no air-fluid levels. There is no acute skull fracture.  IMPRESSION: There is no acute intracranial abnormality. There has not been significant interval change since the study of February 27, 2014.  Electronically Signed   By: David  Martinique   On: 03/01/2014 14:21    Scheduled Meds: . aspirin EC  81 mg Oral q morning - 10a  . budesonide (PULMICORT) nebulizer solution  0.25 mg Nebulization BID  . calcium acetate  667 mg Oral TID  . carvedilol  25 mg Oral BID WC  . ceFEPime (MAXIPIME) IV  2 g Intravenous Q T,Th,Sa-HD  . cinacalcet  30 mg Oral QPM  . clopidogrel  75 mg Oral Daily  . enoxaparin (LOVENOX) injection  30 mg Subcutaneous Q24H  . epoetin alfa  10,000 Units Intravenous Q T,Th,Sa-HD  . famotidine  20 mg Oral Daily  . fenofibrate  54 mg Oral Daily  . insulin aspart  0-15 Units Subcutaneous TID WC  . insulin aspart  0-5 Units Subcutaneous QHS  . insulin glargine  20 Units Subcutaneous QHS  . isosorbide mononitrate  30 mg Oral Daily  . loratadine  10 mg Oral Daily  . pantoprazole  40 mg Oral Daily  . pregabalin  75 mg Oral Daily  . QUEtiapine Fumarate  150 mg Oral QHS  . sevelamer carbonate   800 mg Oral TID WC  . simvastatin  10 mg Oral q1800  . sodium chloride  3 mL Intravenous Q12H  . vancomycin  1,000 mg Intravenous Q T,Th,Sa-HD   Continuous Infusions:   Principal Problem:   HCAP (healthcare-associated pneumonia) Active Problems:   ESRD (end stage renal disease)   HTN (hypertension)   Diabetes mellitus type II, uncontrolled   Hypoxia   Chest pain    Time spent: More than 30 minutes    Barton Dubois  [facility] Pager [phone] If 7PM-7AM, please contact night-coverage at [email or web address], password [email or web address] 03/02/2014, 6:36 PM  LOS: 3 days

## 2014-03-02 NOTE — Progress Notes (Signed)
Inpatient Diabetes Program Recommendations  AACE/ADA: New Consensus Statement on Inpatient Glycemic Control (2013)  Target Ranges:  Prepandial:   less than 140 mg/dL      Peak postprandial:   less than 180 mg/dL (1-2 hours)      Critically ill patients:  140 - 180 mg/dL   Results for Kayla Gibson, Kayla Gibson (MRN [id number]) as of 03/02/2014 09:55  Ref. Range 03/01/2014 08:05 03/01/2014 11:57 03/01/2014 12:41 03/01/2014 16:48 03/01/2014 20:51 03/02/2014 07:25  Glucose-Capillary Latest Range: 70-99 mg/dL 233 (H) 197 (H) 188 (H) 217 (H) 217 (H) 167 (H)   Diabetes history: DM2  Outpatient Diabetes medications: Lantus 25 units QHS, Humalog 15 units TID with meals  Current orders for Inpatient glycemic control: Lantus 20 units QHS, Novolog 0-15 units AC, Novolog 0-5 units HS   Inpatient Diabetes Program Recommendations Insulin - Basal: Please consider increasing Lantus to 22 units QHS. Insulin - Meal Coverage: Please consider ordering Novolog 3 units TID wtih meals for meal coverage if patient eats at least 50% of meals.  Thanks, Barnie Alderman, RN, MSN, CCRN Diabetes Coordinator Inpatient Diabetes Program [phone] (Team Pager) [phone] ([place] office) [phone] [facility] office)

## 2014-03-02 NOTE — Progress Notes (Addendum)
Kayla Gibson  MRN: [id number]  DOB/AGE: [birth year] 53 y.o.  Primary Care Physician:Default, Provider, MD  Admit date: 02/27/2014  Chief Complaint:  Chief Complaint  Patient presents with  . Abdominal Pain    S-Pt presented on  02/27/2014 with  Chief Complaint  Patient presents with  . Abdominal Pain  .    Pt today feels better  Meds . aspirin EC  81 mg Oral q morning - 10a  . budesonide (PULMICORT) nebulizer solution  0.25 mg Nebulization BID  . calcium acetate  667 mg Oral TID  . carvedilol  25 mg Oral BID WC  . ceFEPime (MAXIPIME) IV  2 g Intravenous Q T,Th,Sa-HD  . cinacalcet  30 mg Oral QPM  . clopidogrel  75 mg Oral Daily  . enoxaparin (LOVENOX) injection  30 mg Subcutaneous Q24H  . epoetin alfa  10,000 Units Intravenous Q T,Th,Sa-HD  . famotidine  20 mg Oral Daily  . fenofibrate  54 mg Oral Daily  . insulin aspart  0-15 Units Subcutaneous TID WC  . insulin aspart  0-5 Units Subcutaneous QHS  . insulin glargine  20 Units Subcutaneous QHS  . isosorbide mononitrate  30 mg Oral Daily  . loratadine  10 mg Oral Daily  . pantoprazole  40 mg Oral Daily  . pregabalin  75 mg Oral Daily  . QUEtiapine Fumarate  150 mg Oral QHS  . sevelamer carbonate  800 mg Oral TID WC  . simvastatin  10 mg Oral q1800  . sodium chloride  3 mL Intravenous Q12H  . vancomycin  1,000 mg Intravenous Q T,Th,Sa-HD         Physical Exam: Vital signs in last 24 hours: Temp:  [97.9 F (36.6 C)-99.5 F (37.5 C)] 97.9 F (36.6 C) (09/15 0526) Pulse Rate:  [74-88] 74 (09/15 0526) Resp:  [18-20] 20 (09/15 0526) BP: (116-145)/(52-70) 125/69 mmHg (09/15 0526) SpO2:  [98 %-100 %] 98 % (09/15 0747) Weight change:  Last BM Date: 02/26/14  Intake/Output from previous day: 09/14 0701 - 09/15 0700 In: 600 [P.O.:600] Out: -      Physical Exam: General- pt is awake,alert, oriented to time place and person Resp- No acute REsp distress, CTA B/L NO Rhonchi CVS- S1S2 regular in rate and  rhythm GIT- BS+, soft, NT, ND EXT- NO LE Edema, Cyanosis Access AVG + two needles in situ  Lab Results: CBC  Recent Labs  02/27/14 2028 03/01/14 0856  WBC 10.0 7.5  HGB 10.1* 9.6*  HCT 31.1* 29.4*  PLT 188 165    BMET  Recent Labs  03/01/14 0856 03/02/14 0617  NA 137 138  K 4.3 4.2  CL 94* 95*  CO2 30 29  GLUCOSE 224* 152*  BUN 22 31*  CREATININE 5.71* 7.50*  CALCIUM 8.7 8.6    MICRO Recent Results (from the past 240 hour(s))  CULTURE, BLOOD (ROUTINE X 2)     Status: None   Collection Time    02/27/14  2:46 PM      Result Value Ref Range Status   Specimen Description RIGHT ANTECUBITAL   Final   Special Requests BOTTLES DRAWN AEROBIC ONLY 10CC   Final   Culture NO GROWTH 2 DAYS   Final   Report Status PENDING   Incomplete  CULTURE, BLOOD (ROUTINE X 2)     Status: None   Collection Time    02/27/14  2:47 PM      Result Value Ref Range Status   Specimen Description BLOOD RIGHT  HAND   Final   Special Requests BOTTLES DRAWN AEROBIC ONLY 10CC   Final   Culture NO GROWTH 2 DAYS   Final   Report Status PENDING   Incomplete      Lab Results  Component Value Date   CALCIUM 8.6 03/02/2014   PHOS 4.3 03/01/2014      Impression: 1)Renal  ESRD on HD.                On MWF schedule a outpt                ON TTS as inpt.                  2)HTN  Medication- On Alpha and beta Blockers  On Vasodilators  3)Anemia HGb at goal (9--11)   4)CKD Mineral-Bone Disorder Phosphorus at goal. Calcium at goal.  5)ID  Pneumonia On ABX  Primary MD following  6)Electrolytes Normokalemic NOrmonatremic   7)Acid base Co2 at goal   8)Resp- admitted with acute resp failure Multifactorial in etiology Volume + HCAP + OSA. Now better  Plan:  Pt is being dialyzed today.      BHUTANI,MANPREET S 03/02/2014, 9:00 AM

## 2014-03-02 NOTE — Procedures (Signed)
[place] A. Merlene Laughter, MD     [email or web address]           HISTORY: The patient is a 53 year old presents with confusion and altered mental status. This is been done to evaluate for nonconvulsive seizures.  MEDICATIONS: Scheduled Meds: Continuous Infusions: PRN Meds:.    Prior to Admission medications   Medication Sig Start Date End Date Taking? Authorizing Provider  amLODipine (NORVASC) 10 MG tablet Take 10 mg by mouth daily.    Historical Provider, MD  aspirin EC 81 MG tablet Take 81 mg by mouth every morning.     Historical Provider, MD  calcium acetate (PHOSLO) 667 MG capsule Take 667 mg by mouth 3 (three) times daily.      Historical Provider, MD  carvedilol (COREG) 25 MG tablet Take 25 mg by mouth 2 (two) times daily with a meal.      Historical Provider, MD  cinacalcet (SENSIPAR) 30 MG tablet Take 30 mg by mouth every evening.      Historical Provider, MD  clopidogrel (PLAVIX) 75 MG tablet Take 75 mg by mouth daily.    Historical Provider, MD  famotidine (PEPCID) 20 MG tablet Take 20 mg by mouth 2 (two) times daily.    Historical Provider, MD  fenofibrate (TRICOR) 48 MG tablet Take 48 mg by mouth daily.    Historical Provider, MD  hydrALAZINE (APRESOLINE) 50 MG tablet Take 50 mg by mouth 4 (four) times daily.    Historical Provider, MD  insulin glargine (LANTUS) 100 UNIT/ML injection Inject 25 Units into the skin at bedtime.      Historical Provider, MD  insulin lispro (HUMALOG) 100 UNIT/ML injection Inject 15 Units into the skin 3 (three) times daily before meals.      Historical Provider, MD  isosorbide mononitrate (IMDUR) 30 MG 24 hr tablet Take 30 mg by mouth daily.    Historical Provider, MD  lisinopril (PRINIVIL,ZESTRIL) 20 MG tablet Take 20 mg by mouth 2 (two) times daily.    Historical Provider, MD  pravastatin (PRAVACHOL) 20 MG tablet Take 20 mg by mouth every morning.     Historical Provider, MD  pregabalin (LYRICA) 75 MG capsule Take 75 mg by  mouth daily.     Historical Provider, MD  promethazine (PHENERGAN) 25 MG suppository Place 1 suppository (25 mg total) rectally every 6 (six) hours as needed for nausea. 11/20/11 05/08/12  Prentiss Bells, MD  QUEtiapine Fumarate (SEROQUEL XR) 150 MG 24 hr tablet Take 150 mg by mouth at bedtime.      Historical Provider, MD  sevelamer (RENVELA) 800 MG tablet Take 800 mg by mouth 3 (three) times daily with meals.      Historical Provider, MD  sucroferric oxyhydroxide (VELPHORO) 500 MG chewable tablet Chew 500 mg by mouth 3 (three) times daily with meals.    Historical Provider, MD  traMADol (ULTRAM) 50 MG tablet Take 50 mg by mouth daily as needed. For back  pain     Historical Provider, MD      ANALYSIS: A 16 channel recording using standard 10 20 measurements is conducted for 21 minutes. There is a posterior dominant rhythm of 8 Hz which attenuates with eye opening. Beta activity is observed in the frontal areas. Most of the recording is observed with stage II sleep. K complexes and see spindles are observed. Photic stimulation and hyperventilation were not carried out. There are no epileptiform activity is observed. Additionally, there are no focal or lateralized slowing.  IMPRESSION: 1. Unremarkable routine electrographic recording of the awake and sleep states.      Jalin Alicea A. Merlene Laughter, M.D.  Diplomate, Tax adviser of Psychiatry and Neurology ( Neurology).

## 2014-03-02 NOTE — Procedures (Signed)
   HEMODIALYSIS TREATMENT NOTE:  3.75 hour heparin-free dialysis completed via left forearm loop AVG (15g/retrograde flow).  Goal NOT met:  BP unable to tolerate removal of 3 liters as ordered.  Ultrafiltration interrupted x23 minutes per standing order to maintain SBP>90. Epogen 10Ku, Vancomycin 1g, Maxipime 2g given with HD.  All blood was reinfused and hemostasis was achieved within 15 minutes.  Report called to Lonzo Cloud, RN.  Laquanna Veazey L. Heidi Maclin, RN, CDN

## 2014-03-03 DIAGNOSIS — J9600 Acute respiratory failure, unspecified whether with hypoxia or hypercapnia: Secondary | ICD-10-CM

## 2014-03-03 LAB — PROCALCITONIN: Procalcitonin: 0.83 ng/mL

## 2014-03-03 LAB — GLUCOSE, CAPILLARY
GLUCOSE-CAPILLARY: 182 mg/dL — AB (ref 70–99)
GLUCOSE-CAPILLARY: 241 mg/dL — AB (ref 70–99)
Glucose-Capillary: 200 mg/dL — ABNORMAL HIGH (ref 70–99)
Glucose-Capillary: 166 mg/dL — ABNORMAL HIGH (ref 70–99)

## 2014-03-03 LAB — VITAMIN B12: Vitamin B-12: 395 pg/mL (ref 211–911)

## 2014-03-03 LAB — TSH: TSH: 5.81 u[IU]/mL — ABNORMAL HIGH (ref 0.350–4.500)

## 2014-03-03 NOTE — Progress Notes (Signed)
Inpatient Diabetes Program Recommendations  AACE/ADA: New Consensus Statement on Inpatient Glycemic Control (2013)  Target Ranges:  Prepandial:   less than 140 mg/dL      Peak postprandial:   less than 180 mg/dL (1-2 hours)      Critically ill patients:  140 - 180 mg/dL   Results for Kayla Gibson, Kayla Gibson (MRN [id number]) as of 03/03/2014 07:59  Ref. Range 03/02/2014 07:25 03/02/2014 16:32 03/02/2014 20:45 03/03/2014 07:20  Glucose-Capillary Latest Range: 70-99 mg/dL 167 (H) 180 (H) 297 (H) 182 (H)   Diabetes history: DM2 Outpatient Diabetes medications: Lantus 25 units QHS, Humalog 15 units TID with meals  Current orders for Inpatient glycemic control: Lantus 20 units QHS, Novolog 0-15 units AC, Novolog 0-5 units HS   Inpatient Diabetes Program Recommendations Insulin - Basal: Please consider increasing Lantus to 22 units QHS. Insulin - Meal Coverage: Post prandial glucose consistently elevated. Please consider ordering Novolog 3 units TID wtih meals for meal coverage.  Thanks, Barnie Alderman, RN, MSN, CCRN Diabetes Coordinator Inpatient Diabetes Program [phone] (Team Pager) [phone] ([place] office) [phone] [facility] office)

## 2014-03-03 NOTE — Progress Notes (Signed)
Kayla Gibson  MRN: [id number]  DOB/AGE: January 26, 1961 53 y.o.  Primary Care Physician:Kayla DESAI, MD  Admit date: 02/27/2014  Chief Complaint:  Chief Complaint  Patient presents with  . Abdominal Pain    S-Pt presented on  02/27/2014 with  Chief Complaint  Patient presents with  . Abdominal Pain  .    Pt today feels better  Meds . aspirin EC  81 mg Oral q morning - 10a  . budesonide (PULMICORT) nebulizer solution  0.25 mg Nebulization BID  . calcium acetate  667 mg Oral TID  . carvedilol  25 mg Oral BID WC  . ceFEPime (MAXIPIME) IV  2 g Intravenous Q T,Th,Sa-HD  . cinacalcet  30 mg Oral QPM  . clopidogrel  75 mg Oral Daily  . enoxaparin (LOVENOX) injection  30 mg Subcutaneous Q24H  . epoetin alfa  10,000 Units Intravenous Q T,Th,Sa-HD  . famotidine  20 mg Oral Daily  . fenofibrate  54 mg Oral Daily  . insulin aspart  0-15 Units Subcutaneous TID WC  . insulin aspart  0-5 Units Subcutaneous QHS  . insulin glargine  20 Units Subcutaneous QHS  . isosorbide mononitrate  30 mg Oral Daily  . loratadine  10 mg Oral Daily  . pantoprazole  40 mg Oral Daily  . pregabalin  75 mg Oral Daily  . QUEtiapine Fumarate  150 mg Oral QHS  . sevelamer carbonate  800 mg Oral TID WC  . simvastatin  10 mg Oral q1800  . sodium chloride  3 mL Intravenous Q12H  . vancomycin  1,000 mg Intravenous Q T,Th,Sa-HD         Physical Exam: Vital signs in last 24 hours: Temp:  [98.1 F (36.7 C)-98.7 F (37.1 C)] 98.3 F (36.8 C) (09/16 1442) Pulse Rate:  [70-88] 74 (09/16 1442) Resp:  [16-20] 18 (09/16 1442) BP: (103-133)/(48-61) 103/48 mmHg (09/16 1442) SpO2:  [94 %-100 %] 96 % (09/16 1442) Weight:  [258 lb 6.4 oz (117.209 kg)] 258 lb 6.4 oz (117.209 kg) (09/16 0554) Weight change:  Last BM Date: 03/02/14  Intake/Output from previous day: 09/15 0701 - 09/16 0700 In: 240 [P.O.:240] Out: 2652  Total I/O In: 720 [P.O.:720] Out: -    Physical Exam: General- pt is awake,alert,  oriented to time place and person Resp- No acute REsp distress, CTA B/L NO Rhonchi CVS- S1S2 regular in rate and rhythm GIT- BS+, soft, NT, ND EXT- NO LE Edema, Cyanosis Access AVG +    Lab Results: CBC  Recent Labs  03/01/14 0856  WBC 7.5  HGB 9.6*  HCT 29.4*  PLT 165    BMET  Recent Labs  03/01/14 0856 03/02/14 0617  NA 137 138  K 4.3 4.2  CL 94* 95*  CO2 30 29  GLUCOSE 224* 152*  BUN 22 31*  CREATININE 5.71* 7.50*  CALCIUM 8.7 8.6    MICRO Recent Results (from the past 240 hour(s))  CULTURE, BLOOD (ROUTINE X 2)     Status: None   Collection Time    02/27/14  2:46 PM      Result Value Ref Range Status   Specimen Description BLOOD RIGHT ANTECUBITAL   Final   Special Requests BOTTLES DRAWN AEROBIC ONLY 10CC   Final   Culture NO GROWTH 4 DAYS   Final   Report Status PENDING   Incomplete  CULTURE, BLOOD (ROUTINE X 2)     Status: None   Collection Time    02/27/14  2:47 PM  Result Value Ref Range Status   Specimen Description BLOOD RIGHT HAND   Final   Special Requests BOTTLES DRAWN AEROBIC ONLY 10CC   Final   Culture NO GROWTH 4 DAYS   Final   Report Status PENDING   Incomplete      Lab Results  Component Value Date   CALCIUM 8.6 03/02/2014   PHOS 4.3 03/01/2014      Impression: 1)Renal  ESRD on HD.                Pt dialyzed yesterday                  2)HTN  Medication- On Alpha and beta Blockers  On Vasodilators  3)Anemia HGb at goal (9--11)   4)CKD Mineral-Bone Disorder Phosphorus at goal. Calcium at goal.  5)ID  Pneumonia On ABX  Primary MD following  6)Electrolytes Normokalemic NOrmonatremic   7)Acid base Co2 at goal   8)Resp- admitted with acute resp failure Multifactorial in etiology Volume + HCAP + OSA. Now better  Plan:  Will continue current tx Will dialyze in am ,if still admitted.     [name] S 03/03/2014, 3:38 PM

## 2014-03-03 NOTE — Progress Notes (Signed)
PROGRESS NOTE  Kayla Gibson [id number] DOB: [birth year] DOA: 02/27/2014 PCP: Kennieth Rad, MD in [place]  Summary: 53 year old woman presented with right-sided rib pain and history of confusion. Initial evaluation was notable for hypoxia, hypercapnia with respiratory acidosis and possible pneumonia and pulmonary congestion. She was admitted, placed on CPAP and improved. Reported confusion improved with resolution of hypoxia.  Assessment/Plan: 1. Acute respiratory failure with hypoxia and hypercapnia, respiratory acidosis. Hypercapnia now compensated, respiratory acidosis resolved with NIPPV. Hypoxia it appears improved as well. Likely multifactorial, obstructive sleep apnea, obesity hypoventilation syndrome, abnormal trachea may also be contributing. Also considered were pneumonia and CHF, but see below. 2. No echocardiogram on file, she has no history of heart failure none. Vascular congestion likely secondary to volume overload. No evidence of CHF. 3. Possible healthcare associated pneumonia--seen on initial chest x-ray but followup CT angiogram demonstrate no evidence of infiltrate or consolidation. 4. Obstructive sleep apnea, obesity hypoventilation syndrome 5. Acute encephalopathy, secondary to hypercapnia, hypoxemia. Resolved with compliance with CPAP, oxygen. EEG unremarkable. 6. Anemia of chronic disease, stable. 7. Diabetes mellitus with h/o gastroparesis, Hgb A1c 7.7. Stable. 8. TSH elevated 5.810. Not on any medications. Followup testing in the outpatient setting, consider T3/T4. 9. Patulous and dilated esophagus with distal fluid level. This is of uncertain significance but may be related to patient's gastroparesis. Followup with her primary care physician as an outpatient. 10. End-stage renal disease. 11. Hypertension. Hold Norvasc and hydralazine with low blood pressures. 12. Atypical chest pain, secondary to pneumonia, GERD. Troponins negative. CT chest negative, no evidence  of pulmonary embolism. Continue treatment for GERD and pneumonia. 13. Episode of bilateral upper extremity tremor by report several days ago. CT head unremarkable, EEG unremarkable. TIA questioned, however, per patient this has been ongoing since 2007, sometimes lasting all day. History not suggestive of TIA/stroke or seizure. Suspect functional. No further evaluation suggested. Patient continues on aspirin Plavix. 27. 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 Findings may be related to patient's hypoxia"--per radiologist. Recommend outpatient follow-up with pulmonology.   Fish empiric antibiotics 9/17. Wean oxygen as tolerate.  Stress compliance with CPAP and diet low sodium  Check SpO2, assess for home oxygen needs.  Anticipate discharge 9/17.  Code Status: full code DVT prophylaxis: Lovenox Family Communication:  Disposition Plan: home  Murray Hodgkins, MD  [facility]  Pager [phone] If 7PM-7AM, please contact night-coverage at [email or web address], password [facility] 03/03/2014, 12:19 PM  LOS: 4 days   Consultants:  Nephrology  Procedures:    Antibiotics:  Vancomycin 9/12  >>  Cefepime 9/12 >>   HPI/Subjective: Feeling much better. Breathing better. Tolerating diet. No pain. She does have chronic reflux. Right-sided rib pain has resolved. Hearing is at baseline. She reports having had a spell of shaking of her arms several days ago. She reports these spells have been ongoing since 2007, sometimes last all day, feature predominantly tremors in both arms.  Objective: Filed Vitals:   03/03/14 0215 03/03/14 0554 03/03/14 0555 03/03/14 0700  BP: 108/60  110/61   Pulse: 70  74   Temp: 98.1 F (36.7 C)  98.1 F (36.7 C)   TempSrc: Oral  Oral   Resp: 18  18   Height:      Weight:  117.209 kg (258 lb 6.4 oz)    SpO2: 95%  94% 95%    Intake/Output Summary (Last 24 hours) at 03/03/14  1219 Last data filed at 03/03/14 0800  Gross per 24 hour  Intake    480 ml  Output   2652 ml  Net  -2172 ml     Filed Weights   02/27/14 2034 03/03/14 0554  Weight: 117.663 kg (259 lb 6.4 oz) 117.209 kg (258 lb 6.4 oz)    Exam:     Afebrile, vital signs stable. 94% SpO2 on 1 L. Gen. Appears calm, comfortable.  Psych. Speech fluent and clear.  Eyes. Pupils equal, round, react to light.  Cardiovascular. Regular rate and rhythm. No murmur, rub or gallop. No lower extremity edema. Telemetry sinus rhythm.  Respiratory. Clear to auscultation bilaterally. No wheezes, rales or rhonchi. Normal respiratory  Skin. Appears grossly unremarkable.  Musculoskeletal. Moves all extremities well. Grossly normal tone.  Neurologic. No focal neurologic deficits noted.  Data Reviewed:  Capillary blood sugars stable.  Scheduled Meds: . aspirin EC  81 mg Oral q morning - 10a  . budesonide (PULMICORT) nebulizer solution  0.25 mg Nebulization BID  . calcium acetate  667 mg Oral TID  . carvedilol  25 mg Oral BID WC  . ceFEPime (MAXIPIME) IV  2 g Intravenous Q T,Th,Sa-HD  . cinacalcet  30 mg Oral QPM  . clopidogrel  75 mg Oral Daily  . enoxaparin (LOVENOX) injection  30 mg Subcutaneous Q24H  . epoetin alfa  10,000 Units Intravenous Q T,Th,Sa-HD  . famotidine  20 mg Oral Daily  . fenofibrate  54 mg Oral Daily  . insulin aspart  0-15 Units Subcutaneous TID WC  . insulin aspart  0-5 Units Subcutaneous QHS  . insulin glargine  20 Units Subcutaneous QHS  . isosorbide mononitrate  30 mg Oral Daily  . loratadine  10 mg Oral Daily  . pantoprazole  40 mg Oral Daily  . pregabalin  75 mg Oral Daily  . QUEtiapine Fumarate  150 mg Oral QHS  . sevelamer carbonate  800 mg Oral TID WC  . simvastatin  10 mg Oral q1800  . sodium chloride  3 mL Intravenous Q12H  . vancomycin  1,000 mg Intravenous Q T,Th,Sa-HD   Continuous Infusions:   Principal Problem:   HCAP (healthcare-associated  pneumonia) Active Problems:   ESRD (end stage renal disease)   HTN (hypertension)   Diabetes mellitus type II, uncontrolled   Hypoxia   Chest pain   Time spent 25 minutes

## 2014-03-04 LAB — BASIC METABOLIC PANEL
Anion gap: 14 (ref 5–15)
BUN: 40 mg/dL — ABNORMAL HIGH (ref 6–23)
CO2: 27 mEq/L (ref 19–32)
Calcium: 8.7 mg/dL (ref 8.4–10.5)
Chloride: 95 mEq/L — ABNORMAL LOW (ref 96–112)
Creatinine, Ser: 7.52 mg/dL — ABNORMAL HIGH (ref 0.50–1.10)
GFR calc Af Amer: 6 mL/min — ABNORMAL LOW (ref >90)
GFR calc non Af Amer: 6 mL/min — ABNORMAL LOW (ref >90)
Glucose, Bld: 196 mg/dL — ABNORMAL HIGH (ref 70–99)
Potassium: 4.3 mEq/L (ref 3.7–5.3)
Sodium: 136 mEq/L — ABNORMAL LOW (ref 137–147)

## 2014-03-04 LAB — CULTURE, BLOOD (ROUTINE X 2)
Culture: NO GROWTH
Culture: NO GROWTH

## 2014-03-04 LAB — CBC
HCT: 29.5 % — ABNORMAL LOW (ref 36.0–46.0)
Hemoglobin: 9.5 g/dL — ABNORMAL LOW (ref 12.0–15.0)
MCH: 30.4 pg (ref 26.0–34.0)
MCHC: 32.2 g/dL (ref 30.0–36.0)
MCV: 94.6 fL (ref 78.0–100.0)
Platelets: 177 10*3/uL (ref 150–400)
RBC: 3.12 MIL/uL — ABNORMAL LOW (ref 3.87–5.11)
RDW: 15 % (ref 11.5–15.5)
WBC: 6.7 10*3/uL (ref 4.0–10.5)

## 2014-03-04 LAB — GLUCOSE, CAPILLARY
GLUCOSE-CAPILLARY: 175 mg/dL — AB (ref 70–99)
Glucose-Capillary: 210 mg/dL — ABNORMAL HIGH (ref 70–99)

## 2014-03-04 MED ORDER — STROKE: EARLY STAGES OF RECOVERY BOOK
Freq: Once | Status: DC
Start: 2014-03-04 — End: 2014-03-04
  Filled 2014-03-04: qty 1

## 2014-03-04 MED ORDER — PANTOPRAZOLE SODIUM 40 MG PO TBEC: 40.0000 mg | DELAYED_RELEASE_TABLET | Freq: Every day | ORAL | Status: DC

## 2014-03-04 NOTE — Progress Notes (Signed)
AVS reviewed with patient.  Verbalized understanding of discharge instructions, physician follow-up, and medications.  Patient provided with stroke education.  Patient's IV removed.  Site WNL.  Patient transported by pt advocate via w/c to main entrance for discharge.  Patient stable at time of discharge.

## 2014-03-04 NOTE — Discharge Summary (Addendum)
Physician Discharge Summary  Kayla Gibson [id number] DOB: November 51, 1962 DOA: 02/27/2014  PCP: Kennieth Rad, MD  Admit date: 02/27/2014 Discharge date: 03/04/2014  Recommendations for Outpatient Follow-up:  1. Compliance with low salt diet, diabetic diet, CPAP. 2. Abnormal appearance of the esophagus on CT. See study below. Consider outpatient GI evaluation. 3. Abnormal appearance of the trachea and narrowing of mainstem bronchi, see CT findings below. Consider outpatient pulmonology evaluation. 4. Chronic bilateral tremors of unclear etiology. See discussion below 5. Elevated TSH of unclear significance, see discussion below. Could consider T3, T4.   Follow-up Information   Follow up with Kennieth Rad, MD. Schedule an appointment as soon as possible for a visit in 1 week.   Contact information:   [facility] [address] [place] [address] [phone]      Discharge Diagnoses:  1. Acute respiratory failure with hypercapnia, hypoxia, respiratory acidosis; possible sepsis on admission. 2. Possible healthcare associated pneumonia 3. Acute encephalopathy 4. Obstructive sleep apnea 5. Obesity hypoventilation syndrome 6. Anemia of chronic disease 7. Diabetes mellitus with history of gastroparesis 8. Elevated TSH 9. Lactulose and dilated esophagus of unclear etiology and significance 10. End stage renal disease 11. Atypical chest pain 12. 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 Disposition: Home.  Diet recommendation: Heart healthy diabetic diet.  Filed Weights   03/03/14 0554 03/04/14 1049 03/04/14 1520  Weight: 117.209 kg (258 lb 6.4 oz) 117.6 kg (259 lb 4.2 oz) 114.3 kg (251 lb 15.8 oz)    History of present illness:  53 year old woman presented with right-sided rib pain and history of confusion. Initial evaluation was notable for  hypoxia, hypercapnia with respiratory acidosis and possible pneumonia and pulmonary congestion. She was admitted, placed on CPAP and improved. Reported confusion improved with resolution of hypoxia.  Hospital Course:  Kayla Gibson improved with treatment for acute respiratory failure with NIPPV with resolution of respiratory acidosis and hypoxia. She was treated empirically for possible pneumonia seen initially on chest x-ray although not confirmed on chest CT. Her hypoxia at this point has resolved and is likely multifactorial including obstructive sleep apnea, obesity hypoventilation syndrome. Other considerations were possible pneumonia, CHF but see below. She completed a course of antibiotics for possible pneumonia but given lack of radiographic evidence antibiotics discontinued after a total of 7 days (on HD). She is now stable for discharge.  1. Acute respiratory failure with hypoxia and hypercapnia, respiratory acidosis. Hypercapnia now compensated, respiratory acidosis resolved with NIPPV. Hypoxia resolved. Likely multifactorial, obstructive sleep apnea, obesity hypoventilation syndrome, abnormal trachea may also be contributing. Also considered were pneumonia and CHF, but see below. 2. No echocardiogram on file, she has no history of heart failure none. Vascular congestion likely secondary to volume overload. No evidence of CHF. 3. Possible healthcare associated pneumonia--seen on initial chest x-ray but followup CT angiogram demonstrated no evidence of infiltrate or consolidation. 4. Obstructive sleep apnea on CPAP at home, obesity hypoventilation syndrome 5. Acute encephalopathy, secondary to hypercapnia, hypoxia. Resolved with compliance with CPAP, oxygen. EEG unremarkable. 6. Anemia of chronic disease, stable. 7. Diabetes mellitus with h/o gastroparesis, Hgb A1c 7.7. Stable. 8. TSH elevated 5.810. Not on any medications. Followup testing in the outpatient setting, consider  T3/T4. 9. Patulous and dilated esophagus with distal fluid level. This is of uncertain significance but may be related to patient's gastroparesis. Followup with her primary care physician as an outpatient. Asymptomatic.  10. End-stage  renal disease. Stable. 11. Hypertension. Hold Norvasc and hydralazine with low blood pressures. 12. Atypical chest pain, secondary to possible pneumonia, GERD. Troponins negative. CT chest negative, no evidence of pulmonary embolism. Continue treatment for GERD and pneumonia. 13. Episode of bilateral upper extremity tremor by report several days ago. CT head unremarkable, EEG unremarkable. TIA questioned, however, per patient this has been ongoing since 2007, sometimes lasting all day. History not suggestive of TIA/stroke or seizure. Suspect functional. No further evaluation suggested. Patient continues on aspirin, Plavix. 31. 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 Findings may be related to patient's hypoxia"--per radiologist. Recommend outpatient follow-up with pulmonology. Discharge home today. Ambulated with RN, 96% with ambulation on RA.  Consider outpatient referral to pulmonology, patient from [facility], will defer to PCP.  Stress compliance with CPAP and diet low sodium  Consultants:  Nephrology Procedures:  Antibiotics:  Vancomycin 9/12 >> 9/17  Cefepime 9/12 >> 9/17  Discharge Instructions  Discharge Instructions   Activity as tolerated - No restrictions    Complete by:  As directed      Diet - low sodium heart healthy    Complete by:  As directed      Diet Carb Modified    Complete by:  As directed      Discharge instructions    Complete by:  As directed   Call your physician or seek immediate medical attention for shortness of breath, fever, chest pain or worsening of condition. Be sure to stay on a low-salt diet and diabetic diet. Be sure to use your CPAP  machine each night.          Current Discharge Medication List    START taking these medications   Details  pantoprazole (PROTONIX) 40 MG tablet Take 1 tablet (40 mg total) by mouth daily. Qty: 30 tablet, Refills: 0      CONTINUE these medications which have NOT CHANGED   Details  aspirin EC 81 MG tablet Take 81 mg by mouth every morning.     calcium acetate (PHOSLO) 667 MG capsule Take 667 mg by mouth 3 (three) times daily.      carvedilol (COREG) 25 MG tablet Take 25 mg by mouth 2 (two) times daily with a meal.      cinacalcet (SENSIPAR) 30 MG tablet Take 30 mg by mouth every evening.      clopidogrel (PLAVIX) 75 MG tablet Take 75 mg by mouth daily.    famotidine (PEPCID) 20 MG tablet Take 20 mg by mouth 2 (two) times daily.    fenofibrate (TRICOR) 48 MG tablet Take 48 mg by mouth daily.    hydrALAZINE (APRESOLINE) 50 MG tablet Take 50 mg by mouth 4 (four) times daily.    insulin glargine (LANTUS) 100 UNIT/ML injection Inject 25 Units into the skin at bedtime.      insulin lispro (HUMALOG) 100 UNIT/ML injection Inject 15 Units into the skin 3 (three) times daily before meals.      isosorbide mononitrate (IMDUR) 30 MG 24 hr tablet Take 30 mg by mouth daily.    pravastatin (PRAVACHOL) 20 MG tablet Take 20 mg by mouth every morning.     pregabalin (LYRICA) 75 MG capsule Take 75 mg by mouth daily.     QUEtiapine Fumarate (SEROQUEL XR) 150 MG 24 hr tablet Take 150 mg by mouth at bedtime.      sevelamer (RENVELA) 800 MG tablet Take 800 mg by  mouth 3 (three) times daily with meals.      sucroferric oxyhydroxide (VELPHORO) 500 MG chewable tablet Chew 500 mg by mouth 3 (three) times daily with meals.    traMADol (ULTRAM) 50 MG tablet Take 50 mg by mouth daily as needed. For back  pain     promethazine (PHENERGAN) 25 MG suppository Place 1 suppository (25 mg total) rectally every 6 (six) hours as needed for nausea. Qty: 12 each, Refills: 0      STOP taking these  medications     amLODipine (NORVASC) 10 MG tablet      lisinopril (PRINIVIL,ZESTRIL) 20 MG tablet        Allergies  Allergen Reactions  . Dilaudid [Hydromorphone Hcl] Anaphylaxis  . Hydrocodone-Acetaminophen Anaphylaxis  . Tylenol [Acetaminophen] Anaphylaxis  . Ace Inhibitors   . Alprazolam Other (See Comments)    Hallucination  . Bee Venom Swelling  . Zolpidem Tartrate Other (See Comments)    Hallucinations    The results of significant diagnostics from this hospitalization (including imaging, microbiology, ancillary and laboratory) are listed below for reference.    Significant Diagnostic Studies: Dg Chest 1 View  03/01/2014   CLINICAL DATA:  Shortness of breath.  EXAM: CHEST - 1 VIEW  COMPARISON:  02/27/2014.  FINDINGS: Examination is limited by body habitus.  The heart is mildly enlarged but stable. No definite acute pulmonary findings. The bony thorax is grossly normal.  IMPRESSION: Limited examination but no definite acute pulmonary findings.  Stable mild cardiac enlargement   Electronically Signed   By: Kalman Jewels M.D.   On: 03/01/2014 13:54   Ct Head Wo Contrast  03/01/2014   CLINICAL DATA:  Altered mental status, difficulty speaking  EXAM: CT HEAD WITHOUT CONTRAST  TECHNIQUE: Contiguous axial images were obtained from the base of the skull through the vertex without intravenous contrast.  COMPARISON:  Noncontrast CT scan of brain dated February 27, 2014.  FINDINGS: The ventricles are normal in size and position. There is no intracranial hemorrhage nor intracranial mass effect. There is no evidence of acute ischemic change. The cerebellum and brainstem are unremarkable.  The observed paranasal sinuses and mastoid air cells exhibit no air-fluid levels. There is no acute skull fracture.  IMPRESSION: There is no acute intracranial abnormality. There has not been significant interval change since the study of February 27, 2014.   Electronically Signed   By: David  Martinique   On:  03/01/2014 14:21   Ct Head Wo Contrast  02/27/2014   CLINICAL DATA:  Confusion.  EXAM: CT HEAD WITHOUT CONTRAST  TECHNIQUE: Contiguous axial images were obtained from the base of the skull through the vertex without intravenous contrast.  COMPARISON:  None.  FINDINGS: No acute cortical infarct, hemorrhage, or mass lesion ispresent. Ventricles are of normal size. No significant extra-axial fluid collection is present. The paranasal sinuses andmastoid air cells are clear. The osseous skull is intact.  IMPRESSION: 1. No acute intracranial abnormalities.  Normal brain.   Electronically Signed   By: Kerby Moors M.D.   On: 02/27/2014 21:48   Ct Angio Chest Pe W/cm &/or Wo Cm  02/27/2014   CLINICAL DATA:  Right rib pain for 3 days.  Hypoxia.  EXAM: CT ANGIOGRAPHY CHEST WITH CONTRAST  TECHNIQUE: Multidetector CT imaging of the chest was performed using the standard protocol during bolus administration of intravenous contrast. Multiplanar CT image reconstructions and MIPs were obtained to evaluate the vascular anatomy.  CONTRAST:  14mL OMNIPAQUE IOHEXOL 350 MG/ML SOLN  COMPARISON:  None  FINDINGS: Dependent changes are identified within the posterior lung bases. No pleural effusion identified. No airspace consolidation or pulmonary edema noted.  Moderate cardiac enlargement. No pericardial effusion. There is a patulous and dilated thoracic esophagus. Fluid level is identified within the distal esophagus. There is anteroposterior tracheal narrowing. There is luminal narrowing of both the right and left mainstem bronchi. No mediastinal or hilar adenopathy. The main pulmonary artery appears patent. No lobar or segmental pulmonary artery filling defects identified to suggest an acute pulmonary embolus.  Incidental imaging through the upper abdomen is unremarkable.  Review of the visualized osseous structures is on unremarkable.  Review of the MIP images confirms the above findings.  IMPRESSION: 1. No evidence for acute  pulmonary embolus. 2. Moderate cardiac enlargement. 3. Patulous and dilated esophagus with distal fluid level. This is of uncertain significance but may be related to patient's gastroparesis. 4. 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 Findings may be related to patient's hypoxia.   Electronically Signed   By: Kerby Moors M.D.   On: 02/27/2014 22:03   Dg Chest Port 1 View  02/27/2014   CLINICAL DATA:  Short of breath.  Abdominal pain.  EXAM: PORTABLE CHEST - 1 VIEW  COMPARISON:  None.  FINDINGS: Heart size upper limits of normal for projection. Pulmonary vascular congestion is present with fullness of the RIGHT hilum. Infrahilar density is present which may represent atelectasis however aspiration or pneumonia cannot be excluded. No gross pleural effusion. Monitoring leads project over the chest.  IMPRESSION: Pulmonary vascular congestion and RIGHT infrahilar airspace disease which may represent pneumonia or aspiration pneumonitis.   Electronically Signed   By: Dereck Ligas M.D.   On: 02/27/2014 15:47    Microbiology: Recent Results (from the past 240 hour(s))  CULTURE, BLOOD (ROUTINE X 2)     Status: None   Collection Time    02/27/14  2:46 PM      Result Value Ref Range Status   Specimen Description BLOOD RIGHT ANTECUBITAL   Final   Special Requests BOTTLES DRAWN AEROBIC ONLY 10CC   Final   Culture NO GROWTH 5 DAYS   Final   Report Status 03/04/2014 FINAL   Final  CULTURE, BLOOD (ROUTINE X 2)     Status: None   Collection Time    02/27/14  2:47 PM      Result Value Ref Range Status   Specimen Description BLOOD RIGHT HAND   Final   Special Requests BOTTLES DRAWN AEROBIC ONLY 10CC   Final   Culture NO GROWTH 5 DAYS   Final   Report Status 03/04/2014 FINAL   Final     Labs: Basic Metabolic Panel:  Recent Labs Lab 02/27/14 1446 02/27/14 2028 02/28/14 1445 03/01/14 0856 03/02/14 0617  03/04/14 0534  NA 137  --  132* 137 138 136*  K 4.3  --  3.9 4.3 4.2 4.3  CL 93*  --  90* 94* 95* 95*  CO2 30  --  28 30 29 27   GLUCOSE 221*  --  200* 224* 152* 196*  BUN 27*  --  38* 22 31* 40*  CREATININE 6.46* 6.65* 8.12* 5.71* 7.50* 7.52*  CALCIUM 8.5  --  8.4 8.7 8.6 8.7  PHOS  --   --   --  4.3  --   --    Liver Function Tests:  Recent Labs Lab 02/27/14 1446 02/28/14 1445  AST 31 22  ALT 20 15  ALKPHOS 109 94  BILITOT 0.6 0.4  PROT 8.3 7.5  ALBUMIN 3.8 3.4*    Recent Labs Lab 02/27/14 1903  AMMONIA 27   CBC:  Recent Labs Lab 02/27/14 1446 02/27/14 2028 03/01/14 0856 03/04/14 0534  WBC 9.8 10.0 7.5 6.7  NEUTROABS 7.4  --   --   --   HGB 10.6* 10.1* 9.6* 9.5*  HCT 33.1* 31.1* 29.4* 29.5*  MCV 95.7 96.0 94.8 94.6  PLT 197 188 165 177   Cardiac Enzymes:  Recent Labs Lab 02/27/14 1446 02/27/14 2028 02/28/14 0147 02/28/14 1445  TROPONINI <0.30 <0.30 <0.30 <0.30     Recent Labs  02/27/14 1446  PROBNP 429.8*   CBG:  Recent Labs Lab 03/03/14 1105 03/03/14 1653 03/03/14 2132 03/04/14 0723 03/04/14 1549  GLUCAP 241* 200* 166* 210* 175*    Principal Problem:   HCAP (healthcare-associated pneumonia) Active Problems:   ESRD (end stage renal disease)   HTN (hypertension)   Diabetes mellitus type II, uncontrolled   Hypoxia   Chest pain   Time coordinating discharge: 40 minutes  Signed:  Murray Hodgkins, MD [facility] 03/04/2014, 5:55 PM

## 2014-03-04 NOTE — Care Management Note (Signed)
    Page 1 of 1   03/04/2014     5:12:59 PM CARE MANAGEMENT NOTE 03/04/2014  Patient:  [facility]   Account Number:  [id number]  Date Initiated:  03/04/2014  Documentation initiated by:  Theophilus Kinds  Subjective/Objective Assessment:   pt admitted with HCAP. She is from home with spouse and will be returning home. She is independend in the home, with a walker and a W/C that she uses as needed. Spouse assists as needed     Action/Plan:   No needs identified   Anticipated DC Date:  03/04/2014   Anticipated DC Plan:  [place]  CM consult      Choice offered to / List presented to:             Status of service:  Completed, signed off Medicare Important Message given?  YES (If response is "NO", the following Medicare IM given date fields will be blank) Date Medicare IM given:  03/04/2014 Medicare IM given by:  Vladimir Creeks Date Additional Medicare IM given:   Additional Medicare IM given by:    Discharge Disposition:  HOME/SELF CARE  Per UR Regulation:  Reviewed for med. necessity/level of care/duration of stay  If discussed at [place] of Stay Meetings, dates discussed:   03/04/2014    Comments:  03/04/14 1700 Vladimir Creeks RN/CM

## 2014-03-04 NOTE — Progress Notes (Addendum)
Subjective: Interval History: Patient feeling much better and right sided chest pain has improved. Patient difficulty in breathing.  Objective: Vital signs in last 24 hours: Temp:  [97.4 F (36.3 C)-98.5 F (36.9 C)] 98.5 F (36.9 C) (09/17 0459) Pulse Rate:  [68-85] 81 (09/17 0459) Resp:  [18] 18 (09/17 0459) BP: (101-147)/(48-80) 108/48 mmHg (09/17 0459) SpO2:  [93 %-100 %] 93 % (09/17 0719) Weight change:   Intake/Output from previous day: 09/16 0701 - 09/17 0700 In: 960 [P.O.:960] Out: -  Intake/Output this shift:   She is alert and no apparent distress Chest is clear Heart RRR no murmur Abdomen: Obese and none tender Extremities: No edema Lab Results:  Recent Labs  03/01/14 0856 03/04/14 0534  WBC 7.5 6.7  HGB 9.6* 9.5*  HCT 29.4* 29.5*  PLT 165 177   BMET:   Recent Labs  03/02/14 0617 03/04/14 0534  NA 138 136*  K 4.2 4.3  CL 95* 95*  CO2 29 27  GLUCOSE 152* 196*  BUN 31* 40*  CREATININE 7.50* 7.52*  CALCIUM 8.6 8.7   No results found for this basename: PTH,  in the last 72 hours Iron Studies: No results found for this basename: IRON, TIBC, TRANSFERRIN, FERRITIN,  in the last 72 hours  Studies/Results: No results found.  I have reviewed the patient's current medications.  Assessment/Plan: Problem #1 Right chest/flank pain has improved Problem#2 Hypoxemia: Possibly a combination of pneumonia/sleep apnea/CHF. Patient is asymptomatic Problem#3 Hypertension: Her blood pressure is reasanably controlled Problem#4 Anemia: Her hemoglobin is below our target goal but is stable . Patient presently on Epogen during dialysis iv. Problem#5 Metabolic bone disease: Her calcium and phosphorus in range Problem#6 ESRD: patient does not have uremic sign and symptoms and her potassium is normal Problem#7 Obesity Plan: For hemodialysis today Next dialysis will be on tomorrow or Saturday if she is going to be discharged today .Discussed with patient and she told  me she will make arrangement by calling her  Dialysis unit when she is diacharged Use 3 k /2.5 ca bath   LOS: 5 days   Courtny Bennison S 03/04/2014,8:11 AM

## 2014-03-04 NOTE — Care Management Utilization Note (Signed)
UR completed 

## 2014-03-04 NOTE — Progress Notes (Signed)
PROGRESS NOTE  Kayla Gibson [id number] DOB: 10/04/1960 DOA: 02/27/2014 PCP: Kennieth Rad, MD in [place]  Summary: 53 year old woman presented with right-sided rib pain and history of confusion. Initial evaluation was notable for hypoxia, hypercapnia with respiratory acidosis and possible pneumonia and pulmonary congestion. She was admitted, placed on CPAP and improved. Reported confusion improved with resolution of hypoxia.  Assessment/Plan: 1. Acute respiratory failure with hypoxia and hypercapnia, respiratory acidosis. Hypercapnia now compensated, respiratory acidosis resolved with NIPPV. Hypoxia resolved. Likely multifactorial, obstructive sleep apnea, obesity hypoventilation syndrome, abnormal trachea may also be contributing. Also considered were pneumonia and CHF, but see below. 2. No echocardiogram on file, she has no history of heart failure none. Vascular congestion likely secondary to volume overload. No evidence of CHF. 3. Possible healthcare associated pneumonia--seen on initial chest x-ray but followup CT angiogram demonstrated no evidence of infiltrate or consolidation. 4. Obstructive sleep apnea on CPAP at home, obesity hypoventilation syndrome 5. Acute encephalopathy, secondary to hypercapnia, hypoxia. Resolved with compliance with CPAP, oxygen. EEG unremarkable. 6. Anemia of chronic disease, stable. 7. Diabetes mellitus with h/o gastroparesis, Hgb A1c 7.7. Stable. 8. TSH elevated 5.810. Not on any medications. Followup testing in the outpatient setting, consider T3/T4. 9. Patulous and dilated esophagus with distal fluid level. This is of uncertain significance but may be related to patient's gastroparesis. Followup with her primary care physician as an outpatient. Asymptomatic.  10. End-stage renal disease. Stable. 11. Hypertension. Hold Norvasc and hydralazine with low blood pressures. 12. Atypical chest pain, secondary to possible pneumonia, GERD. Troponins negative. CT  chest negative, no evidence of pulmonary embolism. Continue treatment for GERD and pneumonia. 13. Episode of bilateral upper extremity tremor by report several days ago. CT head unremarkable, EEG unremarkable. TIA questioned, however, per patient this has been ongoing since 2007, sometimes lasting all day. History not suggestive of TIA/stroke or seizure. Suspect functional. No further evaluation suggested. Patient continues on aspirin, Plavix. 73. 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 Findings may be related to patient's hypoxia"--per radiologist. Recommend outpatient follow-up with pulmonology.   Discharge home today. Ambulated with RN, 96% with ambulation on RA.  Consider outpatient referral to pulmonology, patient from [facility], will defer to PCP.  Stress compliance with CPAP and diet low sodium  Murray Hodgkins, MD  [facility]  Pager [phone] If 7PM-7AM, please contact night-coverage at [email or web address], password [facility] 03/04/2014, 2:20 PM  LOS: 5 days   Consultants:  Nephrology  Procedures:    Antibiotics:  Vancomycin 9/12  >> 9/17 Cefepime 9/12 >> 9/17  HPI/Subjective: Overall feeling better today. No complaints. Ready to go home.  Objective: Filed Vitals:   03/04/14 1230 03/04/14 1300 03/04/14 1330 03/04/14 1400  BP: 112/51 102/62 109/81 129/71  Pulse: 85 73 89 72  Temp:      TempSrc:      Resp:      Height:      Weight:      SpO2:        Intake/Output Summary (Last 24 hours) at 03/04/14 1420 Last data filed at 03/04/14 0800  Gross per 24 hour  Intake    480 ml  Output      0 ml  Net    480 ml     Filed Weights   03/03/14 0554 03/04/14 1049  Weight: 117.209 kg (258 lb 6.4 oz) 117.6 kg (259 lb 4.2 oz)    Exam:     Afebrile, stable vital  signs. No hypoxia.  Gen. Appears calm, comfortable.  Alert, speech fluent and clear.  Cardiovascular regular rate and  rhythm. No murmur, rub or gallop.  Respiratory clear to auscultation bilaterally. No wheezes, rales or rhonchi. Normal respiratory effort.  Data Reviewed:  Capillary blood sugars stable. Basic metabolic panel consistent with end-stage renal disease. CBC stable.  Scheduled Meds: .  stroke: mapping our early stages of recovery book   Does not apply Once  . aspirin EC  81 mg Oral q morning - 10a  . budesonide (PULMICORT) nebulizer solution  0.25 mg Nebulization BID  . calcium acetate  667 mg Oral TID  . carvedilol  25 mg Oral BID WC  . ceFEPime (MAXIPIME) IV  2 g Intravenous Q T,Th,Sa-HD  . cinacalcet  30 mg Oral QPM  . clopidogrel  75 mg Oral Daily  . enoxaparin (LOVENOX) injection  30 mg Subcutaneous Q24H  . epoetin alfa  10,000 Units Intravenous Q T,Th,Sa-HD  . famotidine  20 mg Oral Daily  . fenofibrate  54 mg Oral Daily  . insulin aspart  0-15 Units Subcutaneous TID WC  . insulin aspart  0-5 Units Subcutaneous QHS  . insulin glargine  20 Units Subcutaneous QHS  . isosorbide mononitrate  30 mg Oral Daily  . loratadine  10 mg Oral Daily  . pantoprazole  40 mg Oral Daily  . pregabalin  75 mg Oral Daily  . QUEtiapine Fumarate  150 mg Oral QHS  . sevelamer carbonate  800 mg Oral TID WC  . simvastatin  10 mg Oral q1800  . sodium chloride  3 mL Intravenous Q12H  . vancomycin  1,000 mg Intravenous Q T,Th,Sa-HD   Continuous Infusions:   Principal Problem:   HCAP (healthcare-associated pneumonia) Active Problems:   ESRD (end stage renal disease)   HTN (hypertension)   Diabetes mellitus type II, uncontrolled   Hypoxia   Chest pain

## 2014-03-04 NOTE — Progress Notes (Signed)
ANTIBIOTIC CONSULT NOTE - follow up  Pharmacy Consult for Vancomycin and renal dose adjustment of antibiotics  Indication: pneumonia  Allergies  Allergen Reactions  . Dilaudid [Hydromorphone Hcl] Anaphylaxis  . Hydrocodone-Acetaminophen Anaphylaxis  . Tylenol [Acetaminophen] Anaphylaxis  . Ace Inhibitors   . Alprazolam Other (See Comments)    Hallucination  . Bee Venom Swelling  . Zolpidem Tartrate Other (See Comments)    Hallucinations   Patient Measurements: Height: 5\' 2"  (157.5 cm) Weight: 258 lb 6.4 oz (117.209 kg) IBW/kg (Calculated) : 50.1  Vital Signs: Temp: 98.4 F (36.9 C) (09/17 0817) Temp src: Oral (09/17 0817) BP: 123/57 mmHg (09/17 0817) Pulse Rate: 77 (09/17 0817) Intake/Output from previous day: 09/16 0701 - 09/17 0700 In: 960 [P.O.:960] Out: -  Intake/Output from this shift: Total I/O In: 240 [P.O.:240] Out: -   Labs:  Recent Labs  03/02/14 0617 03/04/14 0534  WBC  --  6.7  HGB  --  9.5*  PLT  --  177  CREATININE 7.50* 7.52*   Estimated Creatinine Clearance: 10.7 ml/min (by C-G formula based on Cr of 7.52). No results found for this basename: VANCOTROUGH, VANCOPEAK, VANCORANDOM, [place], GENTPEAK, GENTRANDOM, [place], TOBRAPEAK, TOBRARND, AMIKACINPEAK, AMIKACINTROU, AMIKACIN,  in the last 72 hours   Microbiology: Recent Results (from the past 720 hour(s))  CULTURE, BLOOD (ROUTINE X 2)     Status: None   Collection Time    02/27/14  2:46 PM      Result Value Ref Range Status   Specimen Description BLOOD RIGHT ANTECUBITAL   Final   Special Requests BOTTLES DRAWN AEROBIC ONLY 10CC   Final   Culture NO GROWTH 5 DAYS   Final   Report Status 03/04/2014 FINAL   Final  CULTURE, BLOOD (ROUTINE X 2)     Status: None   Collection Time    02/27/14  2:47 PM      Result Value Ref Range Status   Specimen Description BLOOD RIGHT HAND   Final   Special Requests BOTTLES DRAWN AEROBIC ONLY 10CC   Final   Culture NO GROWTH 5 DAYS   Final   Report Status 03/04/2014 FINAL   Final   Medical History: Past Medical History  Diagnosis Date  . Diabetes mellitus   . Osteomyelitis 2009  . Hypertension   . ESRD on hemodialysis   . Gastroparesis   . Depression   . 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   Anti-infectives   Start     Dose/Rate Route Frequency Ordered Stop   03/02/14 1200  vancomycin (VANCOCIN) IVPB 1000 mg/200 mL premix     1,000 mg 200 mL/hr over 60 Minutes Intravenous Every T-Th-Sa (Hemodialysis) 03/01/14 1022     03/02/14 1200  ceFEPIme (MAXIPIME) 2 g in dextrose 5 % 50 mL IVPB     2 g 100 mL/hr over 30 Minutes Intravenous Every T-Th-Sa (Hemodialysis) 03/01/14 1022     02/28/14 1200  cefTRIAXone (ROCEPHIN) 1 g in dextrose 5 % 50 mL IVPB  Status:  Discontinued     1 g 100 mL/hr over 30 Minutes Intravenous Every 24 hours 02/27/14 1954 02/28/14 1525   02/28/14 1100  azithromycin (ZITHROMAX) 500 mg in dextrose 5 % 250 mL IVPB  Status:  Discontinued     500 mg 250 mL/hr over 60 Minutes Intravenous Every 24 hours 02/27/14 1954 02/28/14 1525   02/27/14 2200  ceFEPIme (MAXIPIME) 1 g in dextrose 5 % 50 mL IVPB  Status:  Discontinued     1 g  100 mL/hr over 30 Minutes Intravenous 3 times per day 02/27/14 1959 02/27/14 2034   02/27/14 2200  vancomycin (VANCOCIN) 2,000 mg in sodium chloride 0.9 % 500 mL IVPB     2,000 mg 250 mL/hr over 120 Minutes Intravenous  Once 02/27/14 2036 02/28/14 0000   02/27/14 2100  ceFEPIme (MAXIPIME) 2 g in dextrose 5 % 50 mL IVPB     2 g 100 mL/hr over 30 Minutes Intravenous  Once 02/27/14 2035 02/27/14 2337   02/27/14 1445  cefTRIAXone (ROCEPHIN) 1 g in dextrose 5 % 50 mL IVPB     1 g 100 mL/hr over 30 Minutes Intravenous  Once 02/27/14 1437 02/27/14 1718   02/27/14 1445  azithromycin (ZITHROMAX) 500 mg in dextrose 5 % 250 mL IVPB     500 mg 250 mL/hr over 60 Minutes Intravenous  Once 02/27/14 1437 02/27/14 1820     Assessment: 53yo female with ESRD requiring dialysis.  Day #6 Cefepime and  Vancomycin.  Micro (-).  Likely D/C ABX today per MD note on 9/16.  Goal of Therapy:  Pre-Hemodialysis Vancomycin level goal range =15-25 mcg/ml  Plan:  Vancomycin 1000mg  IV after each dialysis Cefepime 2000mg  IV IV after each dialysis Monitor labs, progress, and cultures  Pricilla Larsson 03/04/2014,10:46 AM

## 2014-03-04 NOTE — Procedures (Signed)
   HEMODIALYSIS TREATMENT NOTE:  4 hour heparin-free dialysis completed via left forearm AVG (15g/RETROGRADE flow).  Goal met:  Tolerated removal of 3.3 liters without interruption in ultrafiltration.  Vancomycin 1g, Maxipime 2g, Epogen 10Ku given with HD.  All blood was reinfused and hemostasis was achieved within 15 minutes.  Report given to Gershon Cull, RN.  Bergen Magner L. Rhayne Chatwin, RN, CDN

## 2014-03-04 NOTE — Progress Notes (Signed)
Late entry 03/03/2014 1900:  Patient's O2 sats while ambulating on RA 96%.  Patient ambulating on unit with front wheel walker accompanied by husband.  Tolerated well. No c/o shortness of breath or wheezing.

## 2014-03-05 NOTE — Progress Notes (Signed)
UR chart review completed.  

## 2014-04-16 NOTE — Progress Notes (Signed)
Patient is >30 days post episode without further incidents.  Patient has attended follow up visit at the [facility] office.  Will close post hospitalization episode from 02/01/14 and follow as needed.

## 2014-06-17 ENCOUNTER — Encounter: Attending: Interventional Cardiology | Primary: Family Medicine

## 2014-06-29 ENCOUNTER — Ambulatory Visit
Admit: 2014-06-29 | Discharge: 2014-06-29 | Payer: MEDICARE | Attending: Interventional Cardiology | Primary: Family Medicine

## 2014-06-29 DIAGNOSIS — R0602 Shortness of breath: Secondary | ICD-10-CM

## 2014-06-29 NOTE — Progress Notes (Signed)
Chief Complaint   Patient presents with   ??? Other     3 month, chest tightness in middle of chest on and off, soboe, no other cardiac symptoms

## 2014-06-29 NOTE — Progress Notes (Signed)
Garret Reddish NP  Subjective/HPI:     Karen Fuller is a 54 y.o. female is here for routine f/u.  The patient denies exertional chest pain/ shortness of breath, orthopnea, PND, LE edema, palpitations, syncope, presyncope or fatigue.       CARDIAC STRUCTURES:  -- ??Global left ventricular function was normal.    -- ??CORONARY CIRCULATION:  -- ??Ostial LAD: There was a diffuse 50 % stenosis.  -- ??1st diagonal: The vessel was small to medium sized. There was a  diffuse 80 % stenosis in the proximal third of the vessel segment.    -- ??1ST LESION INTERVENTIONS:  -- ??A percutaneous intervention was performed on the 50 % lesion in the  ostial LAD. -- ??Myocardial Fractional Flow Reserve (FFR) measurement was  performed using a 0.014" pressure-monitoring Wire Sens Prss AERIS 175cm  guide-wire. Steady baseline values were obtained. Mean arterial pressure  and mean distal coronary pressures were then obtained at maximum  hyperemia. FFR was calculated to be 0.88. Based on the results of this  study, the lesion was judged to be non-significant and no intervention was  performed.        PCP Provider  Phys Other, MD  Past Medical History   Diagnosis Date   ??? Reflux 01/12/2009   ??? DM (diabetes mellitus) ([place]) 01/12/2009   ??? HTN (hypertension) 01/12/2009   ??? Chronic kidney failure 01/12/2009   ??? Abdominal pain 01/12/2009   ??? Nausea & vomiting 01/12/2009   ??? Gastroparesis 01/12/2009   ??? GERD (gastroesophageal reflux disease)    ??? Renal disease    ??? Anemia NEC    ??? Musculoskeletal disorder    ??? Depression    ??? Calculus of kidney    ??? ESRD (end stage renal disease) ([place]) mwf   ??? 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 Hx cesarean section       x2   ??? Hx cholecystectomy     ??? Hx other surgical       jtube removed   ??? Hx orthopaedic       2 back surgeries     Allergies   Allergen Reactions   ??? Bee Venom (Honey Bee) Anaphylaxis   ??? Lortab [Hydrocodone-Acetaminophen] Other (comments)      Stopped Breathing     ??? Tylenol [Acetaminophen] Other (comments)     Stopped Breathing   ??? Ambien [Zolpidem] Other (comments)     Hallucinations   ??? Hydromorphone Shortness of Breath   ??? Xanax [Alprazolam] Other (comments)     hallucinations      Family History   Problem Relation Age of Onset   ??? Cancer Mother    ??? Diabetes Mother    ??? Hypertension Mother    ??? Stroke Father    ??? Diabetes Sister    ??? Hypertension Sister       Current Outpatient Prescriptions   Medication Sig   ??? sucroferric oxyhydroxide 500 mg chew Take  by mouth.   ??? clopidogrel (PLAVIX) 75 mg tablet Take  by mouth daily.   ??? b complex-vitamin c-folic acid 0.8 mg (NEPHRO-VITE) 0.8 mg tab tablet Take 1 Tab by mouth daily.   ??? isosorbide mononitrate ER (IMDUR) 30 mg tablet Take 1 Tab by mouth daily.   ??? famotidine (PEPCID) 20 mg tablet Take 20 mg by mouth two (2) times a day.   ??? fenofibrate nanocrystallized (TRICOR)  48 mg tablet Take  by mouth daily.   ??? cinacalcet (SENSIPAR) 30 mg tablet Take 30 mg by mouth daily.   ??? lisinopril (PRINIVIL, ZESTRIL) 20 mg tablet Take  by mouth daily.   ??? traMADol (ULTRAM) 50 mg tablet Take 50 mg by mouth every six (6) hours as needed for Pain.   ??? hydrALAZINE (APRESOLINE) 50 mg tablet Take 25 mg by mouth four (4) times daily.   ??? sevelamer carbonate (RENVELA) 800 mg tab tab Take  by mouth three (3) times daily.   ??? pravastatin (PRAVACHOL) 20 mg tablet Take 20 mg by mouth daily.     ??? insulin lispro (HUMALOG) 100 unit/mL injection 15 Units by SubCUTAneous route once. With Each Meal    ??? aspirin 81 mg chewable tablet Take 81 mg by mouth daily.   ??? calcium acetate (PHOSLO) 667 mg Cap Take  by mouth three (3) times daily (with meals).   ??? pregabalin (LYRICA) 75 mg capsule Take 75 mg by mouth two (2) times a day.   ??? quetiapine (SEROQUEL) 50 mg tablet Take 150 mg by mouth daily.   ??? amlodipine (NORVASC) 10 mg tablet Take  by mouth daily.   ??? carvedilol (COREG) 25 mg tablet Take 25 mg by mouth two (2) times daily  (with meals).   ??? insulin glargine (LANTUS) 100 unit/mL injection 25 Units by SubCUTAneous route. At Bedtime       No current facility-administered medications for this visit.      Filed Vitals:    06/29/14 1111   BP: 130/70   Pulse: 70   Resp: 16   Height: '5\' 2"'  (1.575 m)   Weight: 251 lb 4.8 oz (113.989 kg)     History     Social History   ??? Marital Status: MARRIED     Spouse Name: N/A     Number of Children: N/A   ??? Years of Education: N/A     Occupational History   ??? Not on file.     Social History Main Topics   ??? Smoking status: Never Smoker    ??? Smokeless tobacco: Not on file   ??? Alcohol Use: No   ??? Drug Use: No   ??? Sexual Activity: Not on file     Other Topics Concern   ??? Not on file     Social History Narrative       I have reviewed the nurses notes, vitals, problem list, allergy list, medical history, family, social history and medications.    Review of Symptoms:    General: Pt denies excessive weight gain or loss. Pt is able to conduct ADL's  HEENT: Denies blurred vision, headaches, epistaxis and difficulty swallowing.  Respiratory: Denies shortness of breath,+ DOE, wheezing or stridor.  Cardiovascular: Denies precordial pain, palpitations, edema or PND  Gastrointestinal: Denies poor appetite, indigestion, abdominal pain or blood in stool      Physical Exam: ??    General: Well developed, in no acute distress, cooperative and alert  HEENT: No carotid bruits, no JVD, trach is midline. Neck Supple, PEERL, EOM intact.  Heart: ??Normal S1/S2 negative S3 or S4. Regular, 1/6 murmur, gallop or rub.??  Respiratory: Clear bilaterally x 4, no wheezing or rales  Abdomen:?? ??Soft, non-tender, no masses, bowel sounds are active.??  Extremities:  No edema, normal cap refill, no cyanosis, atraumatic.   Neuro: A&Ox3, speech clear, in motor wheel chair     Cardiographics    ECG:   No  results found for this or any previous visit.      Cardiology Labs:  Lab Results   Component Value Date/Time     CHOLESTEROL, TOTAL 192 06/27/2010 12:00 AM    HDL CHOLESTEROL 34 06/27/2010 12:00 AM    LDL, CALCULATED 116 06/27/2010 12:00 AM    TRIGLYCERIDE 209 06/27/2010 12:00 AM       Lab Results   Component Value Date/Time    SODIUM 136 02/01/2014 09:45 AM    POTASSIUM 5.4 02/01/2014 09:45 AM    CHLORIDE 97 02/01/2014 09:45 AM    CO2 27 02/01/2014 09:45 AM    ANION GAP 12 02/01/2014 09:45 AM    GLUCOSE 144 02/01/2014 09:45 AM    BUN 51 02/01/2014 09:45 AM    CREATININE 11.84 02/01/2014 09:45 AM    BUN/CREATININE RATIO 4 02/01/2014 09:45 AM    GFR EST AA 4 02/01/2014 09:45 AM    GFR EST NON-AA 3 02/01/2014 09:45 AM    CALCIUM 8.5 02/01/2014 09:45 AM    BILIRUBIN, TOTAL 0.4 02/01/2014 09:45 AM    ALT 20 02/01/2014 09:45 AM    AST 20 02/01/2014 09:45 AM    ALK. PHOSPHATASE 118 02/01/2014 09:45 AM    PROTEIN, TOTAL 8.2 02/01/2014 09:45 AM    ALBUMIN 3.6 02/01/2014 09:45 AM    GLOBULIN 4.6 02/01/2014 09:45 AM    A-G RATIO 0.8 02/01/2014 09:45 AM           Assessment:     Assessment:     Tanveer was seen today for other.    Diagnoses and all orders for this visit:    SOBOE (shortness of breath on exertion)  Orders:  -     AMB POC EKG ROUTINE W/ 12 LEADS, INTER & REP    Systolic murmur    Chronic kidney failure, stage 5 (HCC)    Essential hypertension        ICD-10-CM ICD-9-CM    1. SOBOE (shortness of breath on exertion) R06.02 786.05 AMB POC EKG ROUTINE W/ 12 LEADS, INTER & REP   2. Systolic murmur A63.0 160.1    3. Chronic kidney failure, stage 5 (HCC) N18.5 585.5    4. Essential hypertension I10 401.9      Orders Placed This Encounter   ??? AMB POC EKG ROUTINE W/ 12 LEADS, INTER & REP     Order Specific Question:  Reason for Exam:     Answer:  routine   ??? sucroferric oxyhydroxide 500 mg chew     Sig: Take  by mouth.        Plan:     Patient presents doing well and is stable from cardiac stand point. ESRD: on HD post treatment BP 128/70.  Cath nonobstructive   Continue current care and f/u in 6 months.    MARC A ARNOLD, NP        Pt seen and examined in details. Agree with NP A&P.      Angelique Holm, MD

## 2014-12-28 ENCOUNTER — Ambulatory Visit
Admit: 2014-12-28 | Discharge: 2014-12-28 | Payer: MEDICARE | Attending: Interventional Cardiology | Primary: Family Medicine

## 2014-12-28 DIAGNOSIS — I2510 Atherosclerotic heart disease of native coronary artery without angina pectoris: Secondary | ICD-10-CM

## 2014-12-28 NOTE — Progress Notes (Signed)
12/28/2014 10:49 AM      Subjective:     Karen Fuller is here for f/u visit and pre op evaluation prior to ankle surgery. She denies chest pain, chest pressure/discomfort, dyspnea, palpitations, irregular heart beats, near-syncope, syncope, orthopnea, paroxysmal nocturnal dyspnea, exertional chest pressure/discomfort, claudication.    BP 110/70 mmHg   Pulse 66   Resp 18   Ht 5\' 2"  (1.575 m)   Wt 240 lb (108.863 kg)   BMI 43.89 kg/m2  Current Outpatient Prescriptions   Medication Sig   ??? sucroferric oxyhydroxide 500 mg chew Take  by mouth.   ??? clopidogrel (PLAVIX) 75 mg tablet Take  by mouth daily.   ??? b complex-vitamin c-folic acid 0.8 mg (NEPHRO-VITE) 0.8 mg tab tablet Take 1 Tab by mouth daily.   ??? isosorbide mononitrate ER (IMDUR) 30 mg tablet Take 1 Tab by mouth daily.   ??? famotidine (PEPCID) 20 mg tablet Take 20 mg by mouth two (2) times a day.   ??? fenofibrate nanocrystallized (TRICOR) 48 mg tablet Take  by mouth daily.   ??? cinacalcet (SENSIPAR) 30 mg tablet Take 30 mg by mouth daily.   ??? lisinopril (PRINIVIL, ZESTRIL) 20 mg tablet Take  by mouth daily.   ??? traMADol (ULTRAM) 50 mg tablet Take 50 mg by mouth every six (6) hours as needed for Pain.   ??? hydrALAZINE (APRESOLINE) 50 mg tablet Take 25 mg by mouth four (4) times daily.   ??? sevelamer carbonate (RENVELA) 800 mg tab tab Take  by mouth three (3) times daily.   ??? pravastatin (PRAVACHOL) 20 mg tablet Take 20 mg by mouth daily.     ??? insulin lispro (HUMALOG) 100 unit/mL injection 15 Units by SubCUTAneous route once. With Each Meal    ??? aspirin 81 mg chewable tablet Take 81 mg by mouth daily.   ??? calcium acetate (PHOSLO) 667 mg Cap Take  by mouth three (3) times daily (with meals).   ??? pregabalin (LYRICA) 75 mg capsule Take 75 mg by mouth two (2) times a day.   ??? quetiapine (SEROQUEL) 50 mg tablet Take 150 mg by mouth daily.   ??? amlodipine (NORVASC) 10 mg tablet Take  by mouth daily.    ??? carvedilol (COREG) 25 mg tablet Take 25 mg by mouth two (2) times daily (with meals).   ??? insulin glargine (LANTUS) 100 unit/mL injection 25 Units by SubCUTAneous route. At Bedtime       No current facility-administered medications for this visit.         Objective:      Visit Vitals   Item Reading   ??? BP 110/70 mmHg   ??? Pulse 66   ??? Resp 18   ??? Ht 5\' 2"  (1.575 m)   ??? Wt 240 lb (108.863 kg)   ??? BMI 43.89 kg/m2       Data Review:     EKG: Normal sinus rhythm, no acute st/t changes    Reviewed and/or ordered active problem list, medication list tests    Past Medical History   Diagnosis Date   ??? Reflux 01/12/2009   ??? DM (diabetes mellitus) (HCC) 01/12/2009   ??? HTN (hypertension) 01/12/2009   ??? Chronic kidney failure 01/12/2009   ??? Abdominal pain 01/12/2009   ??? Nausea & vomiting 01/12/2009   ??? Gastroparesis 01/12/2009   ??? GERD (gastroesophageal reflux disease)    ??? Renal disease    ??? Anemia NEC    ??? Musculoskeletal disorder    ???  Depression    ??? Calculus of kidney    ??? ESRD (end stage renal disease) (HCC) mwf   ??? Angina, class II (HCC)       Past Surgical History   Procedure Laterality Date   ??? Hx cesarean section       x2   ??? Hx cholecystectomy     ??? Hx other surgical       jtube removed   ??? Hx orthopaedic       2 back surgeries     Allergies   Allergen Reactions   ??? Bee Venom (Honey Bee) Anaphylaxis   ??? Lortab [Hydrocodone-Acetaminophen] Other (comments)     Stopped Breathing     ??? Tylenol [Acetaminophen] Other (comments)     Stopped Breathing   ??? Ambien [Zolpidem] Other (comments)     Hallucinations   ??? Hydromorphone Shortness of Breath   ??? Xanax [Alprazolam] Other (comments)     hallucinations      Family History   Problem Relation Age of Onset   ??? Cancer Mother    ??? Diabetes Mother    ??? Hypertension Mother    ??? Stroke Father    ??? Diabetes Sister    ??? Hypertension Sister       History     Social History   ??? Marital Status: MARRIED     Spouse Name: N/A   ??? Number of Children: N/A   ??? Years of Education: N/A      Occupational History   ??? Not on file.     Social History Main Topics   ??? Smoking status: Never Smoker    ??? Smokeless tobacco: Not on file   ??? Alcohol Use: No   ??? Drug Use: No   ??? Sexual Activity: Not on file     Other Topics Concern   ??? Not on file     Social History Narrative         Review of Systems??    General:??Not Present- Anorexia, Chills, Dietary Changes, Fatigue, Fever, Medication Changes, Night Sweats, Weight Gain > 10lbs. and Weight Loss > 10lbs..  Skin:??Not Present- Bruising and Excessive Sweating.  HEENT:??Not Present- Headache, Visual Loss and Vertigo.  Respiratory:??Not Present- Cough, Decreased Exercise Tolerance, Difficulty Breathing, Snoring and Wheezing.  Cardiovascular:??Not Present- Abnormal Blood Pressure, Chest Pain, Claudications, Difficulty Breathing On Exertion, Edema, Fainting / Blacking Out, Irregular Heart Beat, Night Cramps, Orthopnea, Palpitations, Paroxysmal Nocturnal Dyspnea, Rapid Heart Rate, Shortness of Breath and Swelling of Extremities.  Gastrointestinal:??Not Present- Black, Tarry Stool, Bloody Stool, Diarrhea, Hematemesis, Rectal Bleeding and Vomiting.  Musculoskeletal:??Not Present- Muscle Pain and Muscle Weakness.  Neurological:??Not Present- Dizziness.  Psychiatric:??Not Present- Depression.  Endocrine:??Not Present- Cold Intolerance, Heat Intolerance and Thyroid Problems.  Hematology:??Not Present- Abnormal Bleeding, Anemia, Blood Clots and Easy Bruising.    ??  Physical Exam??  The physical exam findings are as follows:    ??  General??  Mental Status??- Alert. General Appearance??- Not in acute distress.      Chest and Lung Exam??  Inspection:??Accessory muscles??- No use of accessory muscles in breathing.  Auscultation:??  Breath sounds:??- Normal.      Cardiovascular??  Inspection:??Jugular vein??- Bilateral??- Inspection Normal.  Palpation/Percussion:??  Apical Impulse:??- Normal.  Auscultation:??Rhythm??- Regular. Heart Sounds??- S1 WNL and S2 WNL. No S3 or S4.   Murmurs & Other Heart Sounds:??Auscultation of the heart reveals - No Murmurs.  Carotid arteries??- No Carotid bruit.      Peripheral Vascular??  Upper Extremity:??Inspection??- Bilateral??-  No Cyanotic nailbeds or Digital clubbing.  Lower Extremity:??  Palpation:??Edema??- Bilateral??- No edema.      Assessment:       ICD-10-CM ICD-9-CM    1. Coronary artery disease involving native coronary artery of native heart without angina pectoris I25.10 414.01    2. Essential hypertension, hypertension with unspecified goal I10 401.9 AMB POC EKG ROUTINE W/ 12 LEADS, INTER & REP   3. Chronic kidney failure, unspecified stage N18.9 585.9    4. Pre-op evaluation Z01.818 V72.84        Plan:     Pre op: no further cardiac work up is needed prior to ankle surgery. She had non significant CAD at time of last cath 01/2014 and normal stress test last March. She can proceed with elective surgery as planned. Continue current meds.     CAD - negative FFR to ostial LAD.?? Symptoms improved on IMDUR. Now clinically asymptomatic.      BENIGN ESSENTIAL HYPERTENSION ??  Well controlled. Continue current meds.     ESRD ON HD     She will forward copy of recent FLP.

## 2015-01-06 NOTE — Telephone Encounter (Signed)
Please call Rocky Link resident with [facility] [phone].  Wants to verify why patient is taking Plavix.  Pt scheduled for surgery soon.  Thanks

## 2015-01-07 NOTE — Telephone Encounter (Signed)
Tried to call Rocky LinkKen back @ [phone] regarding pt. Phone just rang and could not leave a message. Will try later.

## 2015-03-02 MED ORDER — ISOSORBIDE MONONITRATE SR 30 MG 24 HR TAB
30 mg | ORAL_TABLET | ORAL | 6 refills | Status: DC
Start: 2015-03-02 — End: 2015-10-17

## 2015-07-21 ENCOUNTER — Encounter: Attending: Interventional Cardiology | Primary: Family Medicine

## 2015-08-16 ENCOUNTER — Ambulatory Visit
Admit: 2015-08-16 | Discharge: 2015-08-16 | Payer: MEDICARE | Attending: Interventional Cardiology | Primary: Family Medicine

## 2015-08-16 DIAGNOSIS — I10 Essential (primary) hypertension: Secondary | ICD-10-CM

## 2015-08-16 NOTE — Progress Notes (Signed)
NAME:  Karen Fuller   DOB:   10-08-1960   MRN:   [id number]   PCP:  Vaughan Browner Other, MD    ??????    Subjective:     The patient is a 55 y.o. year old female  who returns for a routine follow-up. Since the last visit, patient reports no change in exercise tolerance, chest pain, edema, medication intolerance, palpitations, shortness of breath, PND/orthopnea wheezing, sputum, syncope, dizziness or light headedness. Doing well. Is without complaints. Anticipating surgery on her right ankle at [facility].     Past Medical History:   Diagnosis Date   ??? Abdominal pain 01/12/2009   ??? Anemia NEC    ??? Angina, class II (HCC)    ??? Calculus of kidney    ??? Chronic kidney failure 01/12/2009   ??? Depression    ??? DM (diabetes mellitus) (HCC) 01/12/2009   ??? ESRD (end stage renal disease) (HCC) mwf   ??? Gastroparesis 01/12/2009   ??? GERD (gastroesophageal reflux disease)    ??? HTN (hypertension) 01/12/2009   ??? Musculoskeletal disorder    ??? Nausea & vomiting 01/12/2009   ??? Reflux 01/12/2009   ??? Renal disease        Social History   Substance Use Topics   ??? Smoking status: Never Smoker   ??? Smokeless tobacco: Never Used   ??? Alcohol use No      Family History   Problem Relation Age of Onset   ??? Cancer Mother    ??? Diabetes Mother    ??? Hypertension Mother    ??? Stroke Father    ??? Diabetes Sister    ??? Hypertension Sister         Review of Systems  Constitutional: Negative for fever, chills, and diaphoresis.   Respiratory: Negative for cough, hemoptysis, sputum production, shortness of breath and wheezing.   Cardiovascular: Negative for chest pain, palpitations, orthopnea, claudication, leg swelling and PND.   Gastrointestinal: Negative for heartburn, nausea, vomiting, blood in stool and melena.   Genitourinary: Negative for dysuria and flank pain.   Musculoskeletal: Negative for joint pain and back pain.   Skin: Negative for rash.   Neurological: Negative for focal weakness, seizures, loss of consciousness, weakness and headaches.    Endo/Heme/Allergies: Does not bruise/bleed easily.   Psychiatric/Behavioral: Negative for memory loss. The patient does not have insomnia.        Objective:       Vitals:    08/16/15 1047   BP: 134/70   Pulse: 84   Resp: 20   SpO2: 93%   Weight: 246 lb (111.6 kg)   Height:  (1.575 m)    Body mass index is 44.99 kg/(m^2).      General??PE    Gen: NAD     Mental Status - Alert. General Appearance - Not in acute distress.     Neck - no JVD     Chest and Lung Exam     Inspection: Accessory muscles - No use of accessory muscles in breathing.   Auscultation:   Breath sounds: - Normal.     Cardiovascular   Inspection: Jugular vein - Bilateral - Inspection Normal.   Palpation/Percussion:   Apical Impulse: - Normal.   Auscultation: Rhythm - Regular. Heart Sounds - S1 WNL and S2 WNL. No S3 or S4.   Murmurs & Other Heart Sounds: Auscultation of the heart reveals - No Murmurs.     Peripheral Vascular   Upper Extremity: Inspection -  Bilateral - No Cyanotic nailbeds or Digital clubbing.   Lower Extremity:   Palpation: Edema - Bilateral - No edema.     Abdomen: Soft, non-tender, bowel sounds are active.     Neuro: A&O times 3, CN and motor grossly WNL      Data Review:     EKG -  Sinus  Rhythm   -Poor R-wave progression -nonspecific -consider old anterior infarct.     Allergies reviewed  Allergies   Allergen Reactions   ??? Lortab [Hydrocodone-Acetaminophen] Other (comments)     Stopped Breathing     ??? Tylenol [Acetaminophen] Other (comments)     Stopped Breathing   ??? Venom-Honey Bee Anaphylaxis   ??? Ambien [Zolpidem] Other (comments)     Hallucinations   ??? Hydromorphone Shortness of Breath   ??? Xanax [Alprazolam] Other (comments)     hallucinations       Medications reviewed  Current Outpatient Prescriptions   Medication Sig   ??? OTHER    ??? OTHER    ??? isosorbide mononitrate ER (IMDUR) 30 mg tablet TAKE 1 TABLET BY MOUTH ONCE DAILY   ??? clopidogrel (PLAVIX) 75 mg tablet Take  by mouth daily.    ??? famotidine (PEPCID) 20 mg tablet Take 20 mg by mouth two (2) times a day.   ??? fenofibrate nanocrystallized (TRICOR) 48 mg tablet Take  by mouth daily.   ??? cinacalcet (SENSIPAR) 30 mg tablet Take 30 mg by mouth daily.   ??? sevelamer carbonate (RENVELA) 800 mg tab tab Take  by mouth three (3) times daily.   ??? insulin lispro (HUMALOG) 100 unit/mL injection 15 Units by SubCUTAneous route once. With Each Meal    ??? aspirin 81 mg chewable tablet Take 81 mg by mouth daily.   ??? calcium acetate (PHOSLO) 667 mg Cap Take  by mouth three (3) times daily (with meals).   ??? pregabalin (LYRICA) 75 mg capsule Take 75 mg by mouth two (2) times a day.   ??? quetiapine (SEROQUEL) 50 mg tablet Take 150 mg by mouth daily.   ??? amlodipine (NORVASC) 10 mg tablet Take  by mouth daily.   ??? carvedilol (COREG) 25 mg tablet Take 25 mg by mouth two (2) times daily (with meals).   ??? insulin glargine (LANTUS) 100 unit/mL injection 25 Units by SubCUTAneous route. At Bedtime     ??? sucroferric oxyhydroxide 500 mg chew Take  by mouth.   ??? b complex-vitamin c-folic acid 0.8 mg (NEPHRO-VITE) 0.8 mg tab tablet Take 1 Tab by mouth daily.   ??? lisinopril (PRINIVIL, ZESTRIL) 20 mg tablet Take  by mouth daily.   ??? traMADol (ULTRAM) 50 mg tablet Take 50 mg by mouth every six (6) hours as needed for Pain.   ??? hydrALAZINE (APRESOLINE) 50 mg tablet Take 25 mg by mouth four (4) times daily.   ??? pravastatin (PRAVACHOL) 20 mg tablet Take 20 mg by mouth daily.       No current facility-administered medications for this visit.          Assessment:       ICD-10-CM ICD-9-CM    1. Essential hypertension I10 401.9 AMB POC EKG ROUTINE W/ 12 LEADS, INTER & REP        Orders Placed This Encounter   ??? AMB POC EKG ROUTINE W/ 12 LEADS, INTER & REP     Order Specific Question:   Reason for Exam:     Answer:   routine   ??? OTHER   ???  OTHER       Patient Active Problem List   Diagnosis Code   ??? Reflux K21.9   ??? DM (diabetes mellitus) (HCC) E11.9   ??? HTN (hypertension) I10    ??? Chronic kidney failure N18.9   ??? Abdominal pain R10.9   ??? Nausea & vomiting R11.2   ??? Gastroparesis K31.84   ??? Gastroparesis K31.84   ??? Chronic kidney failure N18.9   ??? Nausea & vomiting R11.2   ??? Systolic murmur R01.1   ??? Angina, class II (HCC) I20.9   ??? SOBOE (shortness of breath on exertion) R06.02   ??? Coronary artery disease involving native coronary artery of native heart without angina pectoris I25.10       Plan:     Patient presents for follow up.  ??  CAD - negative FFR to ostial LAD.?? Symptoms improved on IMDUR. Now clinically asymptomatic.   ??  BENIGN ESSENTIAL HYPERTENSION ??  Well controlled. Continue current meds.   ??  ESRD ON HD     Right foot fx: stable for surgery if needed. CLEARED.     On tricor. Labs per PCP in [place], Dr Hughes Better, ANP       Pt seen and examined in details. Agree with NP A&P. Was on statin for hyperlipidemia. Now on tricor. I have advised her to f/u with PCP with regards to cholesterol as she should be on statin, unless contraindicated. She will check with PCP and get back.     Marrian Salvage, MD

## 2015-08-16 NOTE — Progress Notes (Signed)
Chief Complaint   Patient presents with   ??? Hypertension     6 mo appt.  Denied cardiac symptoms.

## 2015-10-18 MED ORDER — ISOSORBIDE MONONITRATE SR 30 MG 24 HR TAB
30 mg | ORAL_TABLET | ORAL | 0 refills | Status: DC
Start: 2015-10-18 — End: 2015-11-15

## 2015-11-15 MED ORDER — ISOSORBIDE MONONITRATE SR 30 MG 24 HR TAB
30 mg | ORAL_TABLET | ORAL | 3 refills | Status: DC
Start: 2015-11-15 — End: 2016-04-10

## 2016-02-14 ENCOUNTER — Encounter: Attending: Interventional Cardiology | Primary: Family Medicine

## 2016-03-15 ENCOUNTER — Ambulatory Visit
Admit: 2016-03-15 | Discharge: 2016-03-15 | Payer: MEDICARE | Attending: Interventional Cardiology | Primary: Family Medicine

## 2016-03-15 DIAGNOSIS — I10 Essential (primary) hypertension: Secondary | ICD-10-CM

## 2016-03-15 NOTE — Progress Notes (Signed)
03/15/2016 12:45 PM      Subjective:     Karen Fuller is here for f/u visit. She denies chest pain, chest pressure/discomfort, dyspnea, palpitations, irregular heart beats, near-syncope, syncope, fatigue, paroxysmal nocturnal dyspnea, exertional chest pressure/discomfort, claudication. C/o occasional headache and feeling nauseous. BP is running high. Per pt not taking hydralazine and lisinopril. Hydralazine was dc by nephrologist and she discontinued lisinopril on her own.     Visit Vitals   ??? BP (!) 180/96 (BP 1 Location: Right arm, BP Patient Position: Sitting)  Comment: pt has not taken bp meds since yesterday   ??? Pulse 82   ??? Resp 16   ??? Ht 5\' 2"  (1.575 m)   ??? SpO2 96%     Current Outpatient Prescriptions   Medication Sig   ??? oxyCODONE IR (ROXICODONE) 5 mg immediate release tablet TK 1 T PO BID   ??? pantoprazole (PROTONIX) 40 mg tablet TK 1 T PO ONCE D   ??? isosorbide mononitrate ER (IMDUR) 30 mg tablet TAKE 1 TABLET BY MOUTH ONCE DAILY   ??? OTHER    ??? OTHER    ??? clopidogrel (PLAVIX) 75 mg tablet Take  by mouth daily.   ??? famotidine (PEPCID) 20 mg tablet Take 20 mg by mouth two (2) times a day.   ??? fenofibrate nanocrystallized (TRICOR) 48 mg tablet Take  by mouth daily.   ??? cinacalcet (SENSIPAR) 30 mg tablet Take 30 mg by mouth daily.   ??? sevelamer carbonate (RENVELA) 800 mg tab tab Take  by mouth three (3) times daily.   ??? pravastatin (PRAVACHOL) 20 mg tablet Take 20 mg by mouth daily.     ??? insulin lispro (HUMALOG) 100 unit/mL injection 15 Units by SubCUTAneous route once. With Each Meal    ??? aspirin 81 mg chewable tablet Take 81 mg by mouth daily.   ??? calcium acetate (PHOSLO) 667 mg Cap Take  by mouth three (3) times daily (with meals).   ??? pregabalin (LYRICA) 75 mg capsule Take 75 mg by mouth two (2) times a day.   ??? amlodipine (NORVASC) 10 mg tablet Take  by mouth daily.   ??? carvedilol (COREG) 25 mg tablet Take 25 mg by mouth two (2) times daily (with meals).    ??? insulin glargine (LANTUS) 100 unit/mL injection 25 Units by SubCUTAneous route. At Bedtime     ??? sucroferric oxyhydroxide 500 mg chew Take  by mouth.   ??? b complex-vitamin c-folic acid 0.8 mg (NEPHRO-VITE) 0.8 mg tab tablet Take 1 Tab by mouth daily.   ??? lisinopril (PRINIVIL, ZESTRIL) 20 mg tablet Take  by mouth daily.   ??? traMADol (ULTRAM) 50 mg tablet Take 50 mg by mouth every six (6) hours as needed for Pain.   ??? hydrALAZINE (APRESOLINE) 50 mg tablet Take 25 mg by mouth four (4) times daily.   ??? quetiapine (SEROQUEL) 50 mg tablet Take 150 mg by mouth daily.     No current facility-administered medications for this visit.          Objective:      Visit Vitals   ??? BP (!) 180/96 (BP 1 Location: Right arm, BP Patient Position: Sitting)   ??? Pulse 82   ??? Resp 16   ??? Ht 5\' 2"  (1.575 m)   ??? SpO2 96%       Data Review:     EKG: Normal sinus rhythm, PVC    Reviewed and/or ordered active problem list, medication list tests  Past Medical History:   Diagnosis Date   ??? Abdominal pain 01/12/2009   ??? Anemia NEC    ??? Angina, class II (HCC)    ??? Calculus of kidney    ??? Chronic kidney failure 01/12/2009   ??? Depression    ??? DM (diabetes mellitus) (HCC) 01/12/2009   ??? ESRD (end stage renal disease) (HCC) mwf   ??? Gastroparesis 01/12/2009   ??? GERD (gastroesophageal reflux disease)    ??? HTN (hypertension) 01/12/2009   ??? Musculoskeletal disorder    ??? Nausea & vomiting 01/12/2009   ??? Reflux 01/12/2009   ??? Renal disease       Past Surgical History:   Procedure Laterality Date   ??? HX CESAREAN SECTION      x2   ??? HX CHOLECYSTECTOMY     ??? HX ORTHOPAEDIC      2 back surgeries   ??? HX OTHER SURGICAL      jtube removed     Allergies   Allergen Reactions   ??? Lortab [Hydrocodone-Acetaminophen] Other (comments)     Stopped Breathing     ??? Tylenol [Acetaminophen] Other (comments)     Stopped Breathing   ??? Venom-Honey Bee Anaphylaxis   ??? Ambien [Zolpidem] Other (comments)     Hallucinations   ??? Hydromorphone Shortness of Breath    ??? Xanax [Alprazolam] Other (comments)     hallucinations      Family History   Problem Relation Age of Onset   ??? Cancer Mother    ??? Diabetes Mother    ??? Hypertension Mother    ??? Stroke Father    ??? Diabetes Sister    ??? Hypertension Sister       Social History     Social History   ??? Marital status: MARRIED     Spouse name: N/A   ??? Number of children: N/A   ??? Years of education: N/A     Occupational History   ??? Not on file.     Social History Main Topics   ??? Smoking status: Never Smoker   ??? Smokeless tobacco: Never Used   ??? Alcohol use No   ??? Drug use: No   ??? Sexual activity: Not on file     Other Topics Concern   ??? Not on file     Social History Narrative         Review of Systems??    General:??Not Present- Anorexia, Chills, Dietary Changes, Fatigue, Fever, Medication Changes, Night Sweats, Weight Gain > 10lbs. and Weight Loss > 10lbs..  Skin:??Not Present- Bruising and Excessive Sweating.  HEENT:??Not Present- Visual Loss and Vertigo.  Respiratory:??Not Present- Cough, Decreased Exercise Tolerance, Difficulty Breathing, Snoring and Wheezing.  Cardiovascular:??Not Present- Chest Pain, Claudications, Difficulty Breathing On Exertion, Edema, Fainting / Blacking Out, Irregular Heart Beat, Night Cramps, Orthopnea, Palpitations, Paroxysmal Nocturnal Dyspnea, Rapid Heart Rate, Shortness of Breath and Swelling of Extremities.  Gastrointestinal:??Not Present- Black, Tarry Stool, Bloody Stool, Diarrhea, Hematemesis, Rectal Bleeding and Vomiting.  Musculoskeletal:??Not Present- Muscle Pain and Muscle Weakness.  Neurological:??Not Present- Dizziness.  Psychiatric:??Not Present- Depression.  Endocrine:??Not Present- Cold Intolerance, Heat Intolerance and Thyroid Problems.  Hematology:??Not Present- Abnormal Bleeding, Anemia, Blood Clots and Easy Bruising.    ??  Physical Exam??  The physical exam findings are as follows:    ??  General??  Mental Status??- Alert. General Appearance??- Not in acute distress.      Chest and Lung Exam??   Inspection:??Accessory muscles??- No use of accessory muscles  in breathing.  Auscultation:??  Breath sounds:??- Normal.      Cardiovascular??  Inspection:??Jugular vein??- Bilateral??- Inspection Normal.  Palpation/Percussion:??  Apical Impulse:??- Normal.  Auscultation:??Rhythm??- Regular. Heart Sounds??- S1 WNL and S2 WNL. No S3 or S4.  Murmurs & Other Heart Sounds:??Auscultation of the heart reveals - No Murmurs.  Carotid arteries??- No Carotid bruit.      Peripheral Vascular??  Upper Extremity:??Inspection??- Bilateral??- No Cyanotic nailbeds or Digital clubbing.  Lower Extremity:??  Palpation:??Edema??- Bilateral??- No edema.      Assessment:       ICD-10-CM ICD-9-CM    1. Essential hypertension I10 401.9 AMB POC EKG ROUTINE W/ 12 LEADS, INTER & REP   2. Coronary artery disease involving native coronary artery of native heart without angina pectoris I25.10 414.01    3. ESRD (end stage renal disease) (HCC) N18.6 585.6    4. Mixed hyperlipidemia E78.2 272.2        Plan:     CAD - negative FFR to ostial LAD.??stable. Continue current meds.    ????  BENIGN ESSENTIAL HYPERTENSION ??  Elevated. Resume ACEI. Monitor BP at home.    ????  ESRD ON HD   ??  Now back on statin. Will get copy of last FLP from PCP office.

## 2016-03-15 NOTE — Telephone Encounter (Addendum)
-----   Message from Marrian SalvageMohammad Sohail Chaudhry, MD sent at 03/15/2016 12:49 PM EDT -----  Get a copy of her last FLP from PCP office.       Called PCP office and asked them to fax us most recent lipid panel to us. She advised that she would get that faxed to us.      Received recent labs from PCP and will place in dr.chaudhry's mail box for review.

## 2016-03-15 NOTE — Progress Notes (Signed)
Chief Complaint   Patient presents with   ??? Hypertension     6 mo f/u   ??? Coronary Artery Disease     "

## 2016-04-10 MED ORDER — ISOSORBIDE MONONITRATE SR 30 MG 24 HR TAB
30 mg | ORAL_TABLET | ORAL | 0 refills | Status: DC
Start: 2016-04-10 — End: 2016-05-12

## 2016-04-11 MED ORDER — ISOSORBIDE MONONITRATE SR 30 MG 24 HR TAB
30 mg | ORAL_TABLET | ORAL | 0 refills | Status: DC
Start: 2016-04-11 — End: 2016-08-20

## 2016-05-14 MED ORDER — ISOSORBIDE MONONITRATE SR 30 MG 24 HR TAB
30 mg | ORAL_TABLET | ORAL | 0 refills | Status: DC
Start: 2016-05-14 — End: 2016-06-01

## 2016-06-01 NOTE — Telephone Encounter (Addendum)
Called pt,verified pt with two pt identifiers, told pt that I had received a non-adherence therapy and wanted to go over her medications with her. We went over her medications and I updated them in cc. Advised her I would let the NP know. She verbalized that she understood everything.      NP notified and went over medications and is okay with medications. Will close out encounter.

## 2016-08-20 MED ORDER — ISOSORBIDE MONONITRATE SR 30 MG 24 HR TAB
30 mg | ORAL_TABLET | ORAL | 0 refills | Status: DC
Start: 2016-08-20 — End: 2016-10-02

## 2016-10-02 MED ORDER — ISOSORBIDE MONONITRATE SR 30 MG 24 HR TAB
30 mg | ORAL_TABLET | ORAL | 0 refills | Status: DC
Start: 2016-10-02 — End: 2016-11-12

## 2016-10-11 ENCOUNTER — Encounter: Attending: Interventional Cardiology | Primary: Family Medicine

## 2016-10-23 ENCOUNTER — Encounter (INDEPENDENT_AMBULATORY_CARE_PROVIDER_SITE_OTHER): Payer: Self-pay | Admitting: *Deleted

## 2016-10-25 ENCOUNTER — Emergency Department (HOSPITAL_COMMUNITY)
Admission: EM | Admit: 2016-10-25 | Discharge: 2016-10-26 | Disposition: A | Discharge status: Home / Self Care | Payer: Medicare Other | Attending: Emergency Medicine | Admitting: Emergency Medicine

## 2016-10-25 ENCOUNTER — Encounter (HOSPITAL_COMMUNITY): Payer: Self-pay | Admitting: *Deleted

## 2016-10-25 DIAGNOSIS — N186 End stage renal disease: Secondary | ICD-10-CM | POA: Insufficient documentation

## 2016-10-25 DIAGNOSIS — I120 Hypertensive chronic kidney disease with stage 5 chronic kidney disease or end stage renal disease: Secondary | ICD-10-CM | POA: Diagnosis not present

## 2016-10-25 DIAGNOSIS — Z794 Long term (current) use of insulin: Secondary | ICD-10-CM | POA: Insufficient documentation

## 2016-10-25 DIAGNOSIS — R197 Diarrhea, unspecified: Secondary | ICD-10-CM | POA: Insufficient documentation

## 2016-10-25 DIAGNOSIS — Z79899 Other long term (current) drug therapy: Secondary | ICD-10-CM | POA: Insufficient documentation

## 2016-10-25 DIAGNOSIS — Z7982 Long term (current) use of aspirin: Secondary | ICD-10-CM | POA: Diagnosis not present

## 2016-10-25 DIAGNOSIS — I313 Pericardial effusion (noninflammatory): Secondary | ICD-10-CM | POA: Insufficient documentation

## 2016-10-25 DIAGNOSIS — J90 Pleural effusion, not elsewhere classified: Secondary | ICD-10-CM

## 2016-10-25 DIAGNOSIS — E1122 Type 2 diabetes mellitus with diabetic chronic kidney disease: Secondary | ICD-10-CM | POA: Insufficient documentation

## 2016-10-25 DIAGNOSIS — Z992 Dependence on renal dialysis: Secondary | ICD-10-CM | POA: Diagnosis not present

## 2016-10-25 DIAGNOSIS — I3139 Other pericardial effusion (noninflammatory): Secondary | ICD-10-CM

## 2016-10-25 DIAGNOSIS — R112 Nausea with vomiting, unspecified: Secondary | ICD-10-CM | POA: Diagnosis not present

## 2016-10-25 DIAGNOSIS — R1013 Epigastric pain: Secondary | ICD-10-CM | POA: Insufficient documentation

## 2016-10-25 MED ORDER — DIPHENHYDRAMINE HCL 50 MG/ML IJ SOLN
25.0000 mg | Freq: Once | INTRAMUSCULAR | Status: AC
  Administered 2016-10-26: 25 mg via INTRAVENOUS
  Filled 2016-10-25: qty 1

## 2016-10-25 MED ORDER — SODIUM CHLORIDE 0.9 % IV BOLUS (SEPSIS)
250.0000 mL | Freq: Once | INTRAVENOUS | Status: AC
  Administered 2016-10-26: 250 mL via INTRAVENOUS

## 2016-10-25 MED ORDER — METOCLOPRAMIDE HCL 5 MG/ML IJ SOLN
10.0000 mg | Freq: Once | INTRAMUSCULAR | Status: AC
  Administered 2016-10-26: 10 mg via INTRAVENOUS
  Filled 2016-10-25: qty 2

## 2016-10-25 NOTE — ED Triage Notes (Signed)
Pt c/o n/v/d x 5 days; pt states she feels cold, but unknown fevers; pt had dialysis yesterday with no problems

## 2016-10-25 NOTE — ED Provider Notes (Signed)
[facility] DEPT Provider Note   CSN: [id number] Arrival date & time: 10/25/16  2138  By signing my name below, I, Dora Sims, attest that this documentation has been prepared under the direction and in the presence of physician practitioner, Betsey Holiday, Gwenyth Allegra, MD. Electronically Signed: Dora Sims, Scribe. 10/25/2016. 11:13 PM.  History   Chief Complaint Chief Complaint  Patient presents with  . Emesis   The history is provided by the patient. No language interpreter was used.    HPI Comments: Kayla Gibson is a 56 y.o. female with PMHx including DM, ESRD on HD MWF, HTN, and gastroparesis who presents to the Emergency Department complaining of persistent nausea, vomiting, and diarrhea for 5 days. She reports some associated epigastric pain that is not modified by vomiting. Patient has been unable to tolerate most food and fluids since onset of her symptoms. No alleviating factors noted. She had a complete dialysis session yesterday and does not make any urine on her own. She denies fevers, chills, or any other associated symptoms.  Past Medical History:  Diagnosis Date  . Depression   . Diabetes mellitus   . ESRD on hemodialysis ([place])   . Gastroparesis   . Hypertension   . 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 Osteomyelitis [facility]) 2009    Patient Active Problem List   Diagnosis Date Noted  . ESRD on hemodialysis ([place])   . HCAP (healthcare-associated pneumonia) 02/27/2014  . Hypoxia 02/27/2014  . Chest pain 02/27/2014  . Hypotension 11/07/2011  . Gastroparesis 11/05/2011  . Abnormal CT of the abdomen 11/05/2011  . Chronic osteomyelitis 11/05/2011  . Gastroenteritis/enteritis 11/04/2011  . ESRD (end stage renal disease) ([place]) 11/04/2011  . HTN (hypertension) 11/04/2011  . Diabetes mellitus type II, uncontrolled ([place]) 11/04/2011  . Depression 11/04/2011    Past Surgical History:  Procedure Laterality Date  . AV FISTULA PLACEMENT     left arm  . BACK  SURGERY    . CHOLECYSTECTOMY    . COLONOSCOPY  11/06/2011   Procedure: COLONOSCOPY;  Surgeon: Rogene Houston, MD;  Location: [facility] SUITE;  Service: Endoscopy;  Laterality: N/A;  . PACEMAKER INSERTION     for gastroparesis    OB History    No data available       Home Medications    Prior to Admission medications   Medication Sig Start Date End Date Taking? Authorizing Provider  aspirin EC 81 MG tablet Take 81 mg by mouth every morning.     [provider]  calcium acetate (PHOSLO) 667 MG capsule Take 667 mg by mouth 3 (three) times daily.      [provider]  carvedilol (COREG) 25 MG tablet Take 25 mg by mouth 2 (two) times daily with a meal.      [provider]  cinacalcet (SENSIPAR) 30 MG tablet Take 30 mg by mouth every evening.      [provider]  clopidogrel (PLAVIX) 75 MG tablet Take 75 mg by mouth daily.    [provider]  famotidine (PEPCID) 20 MG tablet Take 20 mg by mouth 2 (two) times daily.    [provider]  fenofibrate (TRICOR) 48 MG tablet Take 48 mg by mouth daily.    [provider]  hydrALAZINE (APRESOLINE) 50 MG tablet Take 50 mg by mouth 4 (four) times daily.    [provider]  insulin glargine (LANTUS) 100 UNIT/ML injection Inject 25 Units into the skin at bedtime.  [provider]  insulin lispro (HUMALOG) 100 UNIT/ML injection Inject 15 Units into the skin 3 (three) times daily before meals.      [provider]  isosorbide mononitrate (IMDUR) 30 MG 24 hr tablet Take 30 mg by mouth daily.    [provider]  ondansetron (ZOFRAN) 4 MG tablet Take 1 tablet (4 mg total) by mouth every 6 (six) hours. 10/26/16   Orpah Greek, MD  pantoprazole (PROTONIX) 40 MG tablet Take 1 tablet (40 mg total) by mouth daily. 03/04/14   Samuella Cota, MD  pravastatin (PRAVACHOL) 20 MG tablet Take 20 mg by mouth every morning.     [provider]    pregabalin (LYRICA) 75 MG capsule Take 75 mg by mouth daily.     [provider]  promethazine (PHENERGAN) 25 MG suppository Place 1 suppository (25 mg total) rectally every 6 (six) hours as needed for nausea. 11/20/11 05/08/12  Prentiss Bells, MD  QUEtiapine Fumarate (SEROQUEL XR) 150 MG 24 hr tablet Take 150 mg by mouth at bedtime.      [provider]  sevelamer (RENVELA) 800 MG tablet Take 800 mg by mouth 3 (three) times daily with meals.      [provider]  sucroferric oxyhydroxide (VELPHORO) 500 MG chewable tablet Chew 500 mg by mouth 3 (three) times daily with meals.    [provider]  traMADol (ULTRAM) 50 MG tablet Take 50 mg by mouth daily as needed. For back  pain     [provider]    Family History Family History  Problem Relation Age of Onset  . Cancer Mother 69       stomach  . Diabetes Mother   . Stroke Father 63  . Heart failure Father   . Diabetes Sister   . Hypertension Sister     Social History Social History  Substance Use Topics  . Smoking status: Never Smoker  . Smokeless tobacco: Never Used  . Alcohol use No     Allergies   Dilaudid [hydromorphone hcl]; Hydrocodone-acetaminophen; Tylenol [acetaminophen]; Ace inhibitors; Alprazolam; Bee venom; and Zolpidem tartrate   Review of Systems Review of Systems  Constitutional: Negative for chills and fever.  Gastrointestinal: Positive for abdominal pain, diarrhea, nausea and vomiting.  All other systems reviewed and are negative.  Physical Exam Updated Vital Signs BP (!) 132/55   Pulse 78   Temp 98 F (36.7 C) (Oral)   Resp 16   Ht 5\' 2"  (1.575 m)   Wt 209 lb (94.8 kg)   SpO2 94%   BMI 38.23 kg/m   Physical Exam  Constitutional: She is oriented to person, place, and time. She appears well-developed and well-nourished. No distress.  HENT:  Head: Normocephalic and atraumatic.  Right Ear: Hearing normal.  Left Ear: Hearing normal.  Nose: Nose  normal.  Mouth/Throat: Oropharynx is clear and moist and mucous membranes are normal.  Eyes: Conjunctivae and EOM are normal. Pupils are equal, round, and reactive to light.  Neck: Normal range of motion. Neck supple.  Cardiovascular: Regular rhythm, S1 normal and S2 normal.  Exam reveals no gallop and no friction rub.   No murmur heard. Pulmonary/Chest: Effort normal and breath sounds normal. No respiratory distress. She exhibits no tenderness.  Abdominal: Soft. Normal appearance and bowel sounds are normal. There is no hepatosplenomegaly. There is tenderness. There is no rebound, no guarding, no tenderness at McBurney's point and negative Murphy's sign. No hernia.  Epigastric tenderness.  Musculoskeletal: Normal range of motion.  Neurological: She is alert and oriented to person, place, and time. She has normal strength. No cranial nerve deficit or sensory deficit. Coordination normal. GCS eye subscore is 4. GCS verbal subscore is 5. GCS motor subscore is 6.  Skin: Skin is warm, dry and intact. No rash noted. No cyanosis.  Psychiatric: She has a normal mood and affect. Her speech is normal and behavior is normal. Thought content normal.  Nursing note and vitals reviewed.  ED Treatments / Results  Labs (all labs ordered are listed, but only abnormal results are displayed) Labs Reviewed  CBC WITH DIFFERENTIAL/PLATELET - Abnormal; Notable for the following:       Result Value   WBC 10.8 (*)    Hemoglobin 10.6 (*)    HCT 34.1 (*)    RDW 18.5 (*)    Neutro Abs 8.5 (*)    All other components within normal limits  COMPREHENSIVE METABOLIC PANEL - Abnormal; Notable for the following:    Potassium 2.9 (*)    Chloride 95 (*)    Glucose, Bld 161 (*)    Creatinine, Ser 4.60 (*)    Albumin 3.2 (*)    AST 11 (*)    ALT 8 (*)    Alkaline Phosphatase 229 (*)    GFR calc non Af Amer 10 (*)    GFR calc Af Amer 12 (*)    All other components within normal limits  LIPASE, BLOOD    EKG  EKG  Interpretation None       Radiology Ct Abdomen Pelvis W Contrast  Result Date: 10/26/2016 CLINICAL DATA:  Abdominal pain, nausea, vomiting, and diarrhea for 5 days. Epigastric pain. History of diabetes, end-stage renal disease on dialysis, gastroparesis and hypertension. EXAM: CT ABDOMEN AND PELVIS WITH CONTRAST TECHNIQUE: Multidetector CT imaging of the abdomen and pelvis was performed using the standard protocol following bolus administration of intravenous contrast. CONTRAST:  167mL ISOVUE-300 IOPAMIDOL (ISOVUE-300) INJECTION 61% COMPARISON:  03/27/2013 FINDINGS: Lower chest: Small left pleural effusion. Moderate pericardial effusion. Coronary artery calcifications. Atelectasis in the lung bases. Hepatobiliary: No focal liver abnormality is seen. Status post cholecystectomy. No biliary dilatation. Pancreas: Unremarkable. No pancreatic ductal dilatation or surrounding inflammatory changes. Spleen: Normal in size without focal abnormality. Adrenals/Urinary Tract: No adrenal gland nodules. Kidneys are atrophic. No hydronephrosis or hydroureter. subcentimeter renal cysts bilaterally. Bladder is decompressed. Stomach/Bowel: Stomach and small bowel are mostly decompressed. Colon is mostly decompressed with scattered stool. Colonic diverticulosis. No evidence of diverticulitis. Appendix is normal. Vascular/Lymphatic: Normal caliber abdominal aorta. No aneurysm. Arteriovenous fistula in the right groin region. No significant lymphadenopathy. Reproductive: Uterus and bilateral adnexa are unremarkable. Other: No free air or free fluid in the abdomen. Mild diffuse soft tissue edema. Periumbilical hernia containing fat.Generator pack in the lower abdominal wall with stimulator lead extending to the gastric region. Musculoskeletal: Postoperative changes in the lower lumbar spine with posterior fixation from L3 to the sacrum. 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 IMPRESSION: 1. Small left pleural  effusion.  Moderate pericardial effusion. 2. No evidence of bowel obstruction or inflammation. Colonic diverticulosis without diverticulitis. 3. Bilateral renal atrophy. arteriovenous fistula in the right groin region. 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 Electronically Signed   By: Lucienne Capers M.D.   On: 10/26/2016 02:59    Procedures Procedures (including critical care time)  DIAGNOSTIC STUDIES: Oxygen Saturation is 98% on RA, normal by my interpretation.    COORDINATION OF CARE: 11:17 PM Discussed treatment plan  with pt at bedside and pt agreed to plan.  Medications Ordered in ED Medications  morphine 2 MG/ML injection (  Not Given 10/26/16 0212)  sodium chloride 0.9 % bolus 250 mL (0 mLs Intravenous Stopped 10/26/16 0158)  metoCLOPramide (REGLAN) injection 10 mg (10 mg Intravenous Given 10/26/16 0108)  diphenhydrAMINE (BENADRYL) injection 25 mg (25 mg Intravenous Given 10/26/16 0108)  morphine 4 MG/ML injection 4 mg (4 mg Intravenous Given 10/26/16 0211)  iopamidol (ISOVUE-300) 61 % injection 100 mL (100 mLs Intravenous Contrast Given 10/26/16 0229)     Initial Impression / Assessment and Plan / ED Course  I have reviewed the triage vital signs and the nursing notes.  Pertinent labs & imaging results that were available during my care of the patient were reviewed by me and considered in my medical decision making (see chart for details).     Patient presents to the ER for evaluation of epigastric pain with nausea, vomiting and diarrhea. She reports symptoms have been ongoing for 4 or 5 days. She is a dialysis patient, did complete dialysis yesterday.  Abdominal exam was benign. She did have epigastric tenderness, no guarding or rebound. Patient is status post cholecystectomy. Lab work was unremarkable. Patient improved with Reglan and IV fluids. She was still, however, having abdominal pain. CT scan was therefore performed. No acute pathology is noted. She is noted to  have a moderate pericardial effusion. no physiology, suspect that this is chronic. will refer back to her cardiologist at [facility].  Final Clinical Impressions(s) / ED Diagnoses   Final diagnoses:  Pericardial effusion  Pleural effusion  Nausea vomiting and diarrhea    New Prescriptions New Prescriptions   ONDANSETRON (ZOFRAN) 4 MG TABLET    Take 1 tablet (4 mg total) by mouth every 6 (six) hours.   I personally performed the services described in this documentation, which was scribed in my presence. The recorded information has been reviewed and is accurate.    Orpah Greek, MD 10/26/16 [phone]

## 2016-10-26 ENCOUNTER — Emergency Department (HOSPITAL_COMMUNITY): Payer: Medicare Other

## 2016-10-26 DIAGNOSIS — Z992 Dependence on renal dialysis: Secondary | ICD-10-CM

## 2016-10-26 DIAGNOSIS — R112 Nausea with vomiting, unspecified: Secondary | ICD-10-CM | POA: Diagnosis not present

## 2016-10-26 DIAGNOSIS — N186 End stage renal disease: Secondary | ICD-10-CM | POA: Insufficient documentation

## 2016-10-26 LAB — COMPREHENSIVE METABOLIC PANEL
ALK PHOS: 229 U/L — AB (ref 38–126)
ALT: 8 U/L — ABNORMAL LOW (ref 14–54)
AST: 11 U/L — AB (ref 15–41)
Albumin: 3.2 g/dL — ABNORMAL LOW (ref 3.5–5.0)
Anion gap: 13 (ref 5–15)
BILIRUBIN TOTAL: 1.2 mg/dL (ref 0.3–1.2)
BUN: 10 mg/dL (ref 6–20)
CALCIUM: 9.2 mg/dL (ref 8.9–10.3)
CO2: 29 mmol/L (ref 22–32)
CREATININE: 4.6 mg/dL — AB (ref 0.44–1.00)
Chloride: 95 mmol/L — ABNORMAL LOW (ref 101–111)
GFR calc Af Amer: 12 mL/min — ABNORMAL LOW (ref >60)
GFR calc non Af Amer: 10 mL/min — ABNORMAL LOW (ref >60)
Glucose, Bld: 161 mg/dL — ABNORMAL HIGH (ref 65–99)
Potassium: 2.9 mmol/L — ABNORMAL LOW (ref 3.5–5.1)
Sodium: 137 mmol/L (ref 135–145)
Total Protein: 8 g/dL (ref 6.5–8.1)

## 2016-10-26 LAB — CBC WITH DIFFERENTIAL/PLATELET
BASOS ABS: 0 10*3/uL (ref 0.0–0.1)
Basophils Relative: 0 %
Eosinophils Absolute: 0.1 10*3/uL (ref 0.0–0.7)
Eosinophils Relative: 1 %
HEMATOCRIT: 34.1 % — AB (ref 36.0–46.0)
HEMOGLOBIN: 10.6 g/dL — AB (ref 12.0–15.0)
LYMPHS ABS: 1.6 10*3/uL (ref 0.7–4.0)
LYMPHS PCT: 15 %
MCH: 26.9 pg (ref 26.0–34.0)
MCHC: 31.1 g/dL (ref 30.0–36.0)
MCV: 86.5 fL (ref 78.0–100.0)
Monocytes Absolute: 0.6 10*3/uL (ref 0.1–1.0)
Monocytes Relative: 6 %
NEUTROS ABS: 8.5 10*3/uL — AB (ref 1.7–7.7)
Neutrophils Relative %: 78 %
Platelets: 285 10*3/uL (ref 150–400)
RBC: 3.94 MIL/uL (ref 3.87–5.11)
RDW: 18.5 % — ABNORMAL HIGH (ref 11.5–15.5)
WBC: 10.8 10*3/uL — ABNORMAL HIGH (ref 4.0–10.5)

## 2016-10-26 LAB — LIPASE, BLOOD: LIPASE: 14 U/L (ref 11–51)

## 2016-10-26 MED ORDER — MORPHINE SULFATE (PF) 4 MG/ML IV SOLN
4.0000 mg | Freq: Once | INTRAVENOUS | Status: AC
  Administered 2016-10-26: 4 mg via INTRAVENOUS

## 2016-10-26 MED ORDER — MORPHINE SULFATE (PF) 2 MG/ML IV SOLN
INTRAVENOUS | Status: DC
Start: 2016-10-26 — End: 2016-10-26
  Filled 2016-10-26: qty 2

## 2016-10-26 MED ORDER — HYDROMORPHONE HCL 1 MG/ML IJ SOLN: 1.0000 mg | Freq: Once | INTRAMUSCULAR | Status: DC

## 2016-10-26 MED ORDER — ONDANSETRON HCL 4 MG PO TABS: 4.0000 mg | ORAL_TABLET | Freq: Four times a day (QID) | ORAL | 0 refills | Status: DC

## 2016-10-26 MED ORDER — IOPAMIDOL (ISOVUE-300) INJECTION 61%
100.0000 mL | Freq: Once | INTRAVENOUS | Status: AC | PRN
  Administered 2016-10-26: 100 mL via INTRAVENOUS

## 2016-10-26 NOTE — ED Notes (Signed)
This RN attempted x 2 iv start without success

## 2016-10-26 NOTE — ED Notes (Signed)
Patient transported to CT 

## 2016-10-26 NOTE — ED Notes (Signed)
Per pt she is end stage renal failure and on dialysis for 10 years.

## 2016-11-13 ENCOUNTER — Encounter: Attending: Interventional Cardiology | Primary: Family Medicine

## 2016-11-13 MED ORDER — ISOSORBIDE MONONITRATE SR 30 MG 24 HR TAB
30 mg | ORAL_TABLET | ORAL | 0 refills | Status: DC
Start: 2016-11-13 — End: 2016-12-12

## 2016-12-12 MED ORDER — ISOSORBIDE MONONITRATE SR 30 MG 24 HR TAB
30 mg | ORAL_TABLET | ORAL | 0 refills | Status: DC
Start: 2016-12-12 — End: 2017-01-31

## 2017-01-31 MED ORDER — ISOSORBIDE MONONITRATE SR 30 MG 24 HR TAB
30 mg | ORAL_TABLET | ORAL | 0 refills | Status: DC
Start: 2017-01-31 — End: 2017-03-09

## 2017-03-10 MED ORDER — ISOSORBIDE MONONITRATE SR 30 MG 24 HR TAB
30 mg | ORAL_TABLET | ORAL | 0 refills | Status: DC
Start: 2017-03-10 — End: 2017-04-26

## 2017-04-27 MED ORDER — ISOSORBIDE MONONITRATE SR 30 MG 24 HR TAB
30 mg | ORAL_TABLET | ORAL | 0 refills | Status: DC
Start: 2017-04-27 — End: 2017-06-05

## 2017-06-12 MED ORDER — ISOSORBIDE MONONITRATE SR 30 MG 24 HR TAB
30 mg | ORAL_TABLET | ORAL | 0 refills | Status: DC
Start: 2017-06-12 — End: 2017-07-24

## 2017-07-24 MED ORDER — ISOSORBIDE MONONITRATE SR 30 MG 24 HR TAB
30 mg | ORAL_TABLET | ORAL | 0 refills | Status: DC
Start: 2017-07-24 — End: 2017-08-26

## 2017-08-26 MED ORDER — ISOSORBIDE MONONITRATE SR 30 MG 24 HR TAB
30 mg | ORAL_TABLET | ORAL | 0 refills | Status: DC
Start: 2017-08-26 — End: 2017-10-06

## 2017-10-06 MED ORDER — ISOSORBIDE MONONITRATE SR 30 MG 24 HR TAB
30 mg | ORAL_TABLET | ORAL | 0 refills | Status: DC
Start: 2017-10-06 — End: 2017-12-04

## 2017-12-05 MED ORDER — ISOSORBIDE MONONITRATE SR 30 MG 24 HR TAB
30 mg | ORAL_TABLET | ORAL | 0 refills | Status: DC
Start: 2017-12-05 — End: 2018-03-13

## 2018-02-05 IMAGING — CT CT ABD-PELV W/ CM
2 of 4 series · 16 of 46 positions shown, 18 images · IV contrast (Isovue)
Comparison: 03/27/2013

CLINICAL DATA: Abdominal pain, nausea, vomiting, and diarrhea for 5
days. Epigastric pain. History of diabetes, end-stage renal disease
on dialysis, gastroparesis and hypertension.

EXAM:
CT ABDOMEN AND PELVIS WITH CONTRAST
TECHNIQUE: Multidetector CT imaging of the abdomen and pelvis was performed
using the standard protocol following bolus administration of
intravenous contrast.
CONTRAST:  100mL FTDWV6-SVV IOPAMIDOL (FTDWV6-SVV) INJECTION 61%

[Series 2: axial st · axial · 0.88mm/px · z∈[-391,+14]mm · 13 of 91 slices shown, 15 images]
[im 5/91  soft-tissue]
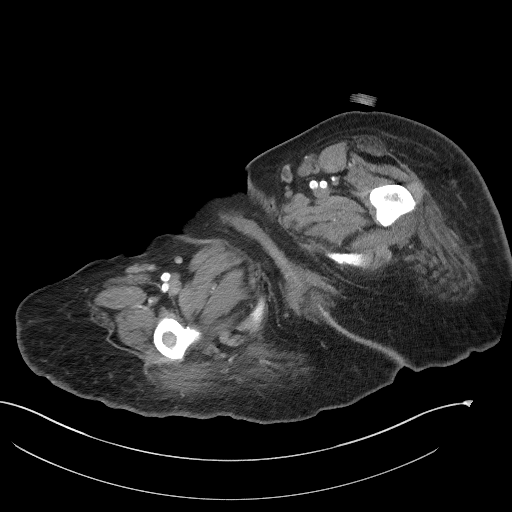
[im 5/91  bone]
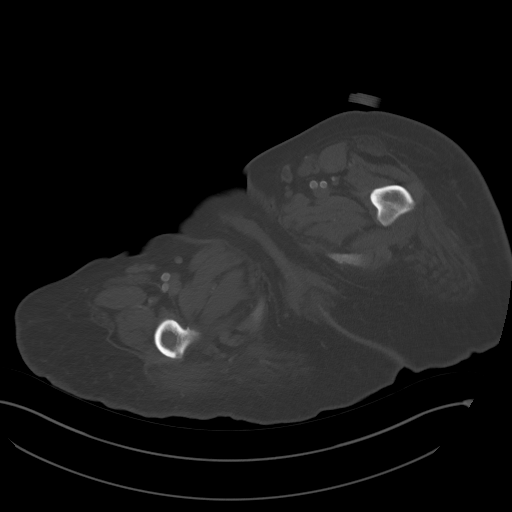
[im 13/91  soft-tissue]
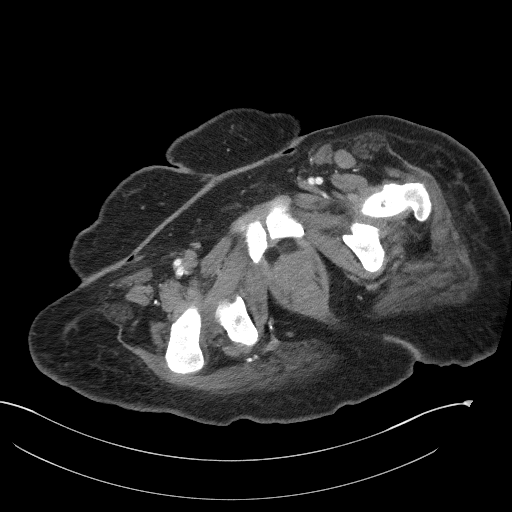
[im 21/91  soft-tissue]
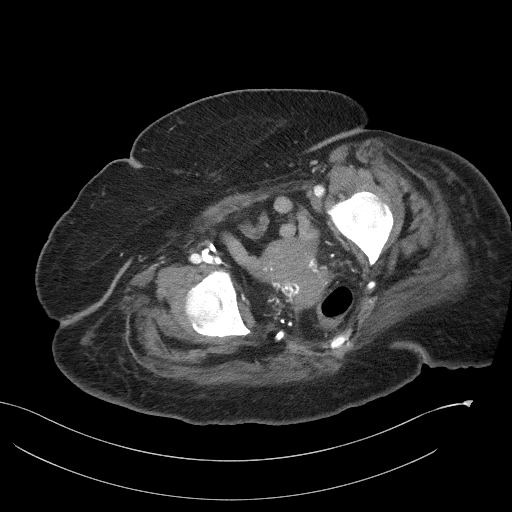
[im 25/91  soft-tissue]
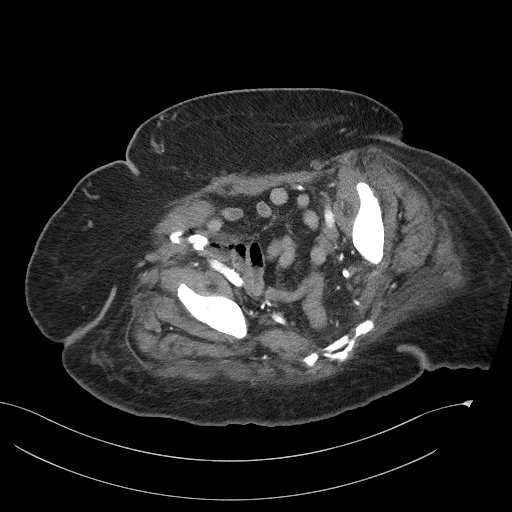
[im 33/91  soft-tissue]
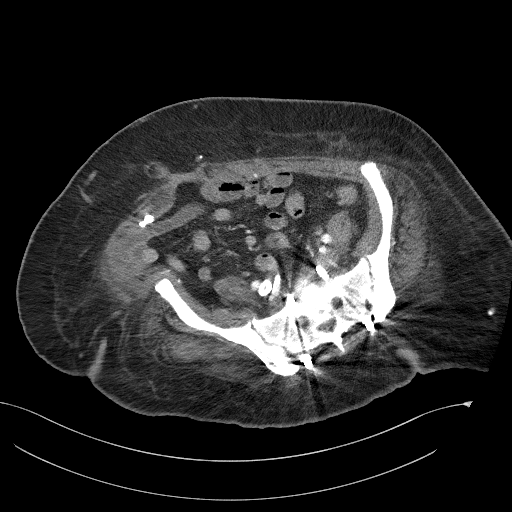
[im 37/91  soft-tissue]
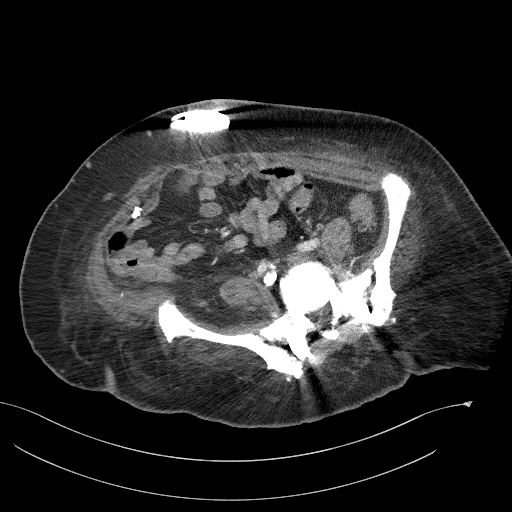
[im 46/91  soft-tissue]
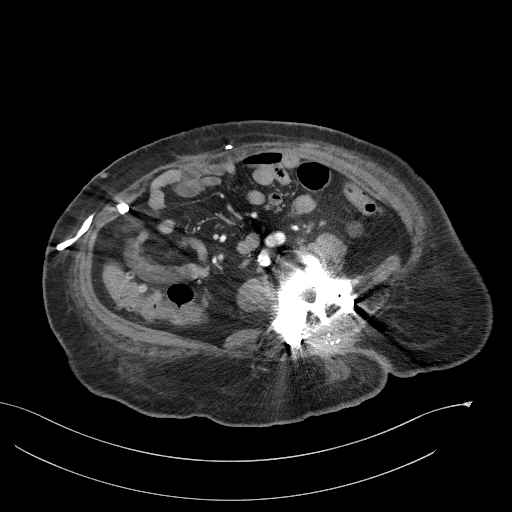
[im 54/91  soft-tissue]
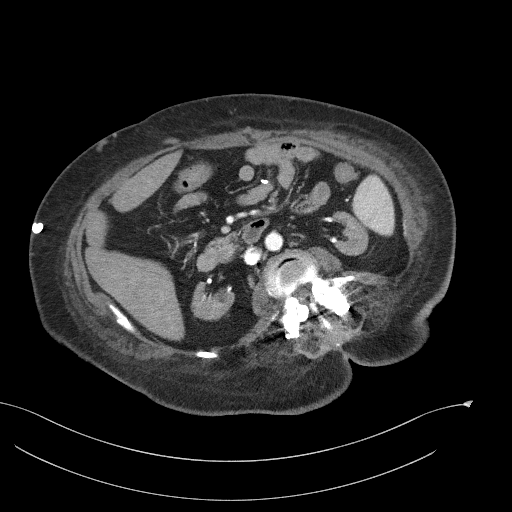
[im 58/91  soft-tissue]
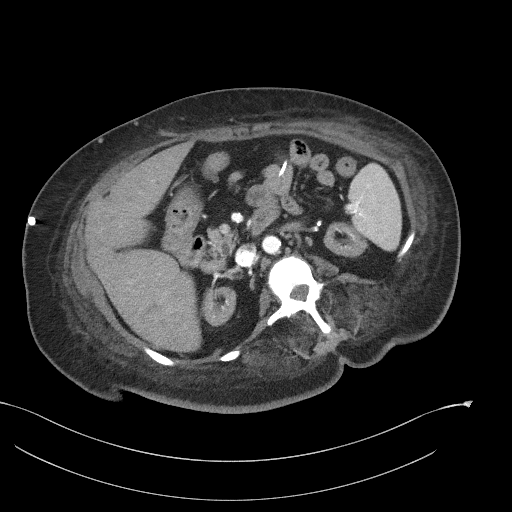
[im 58/91  bone]
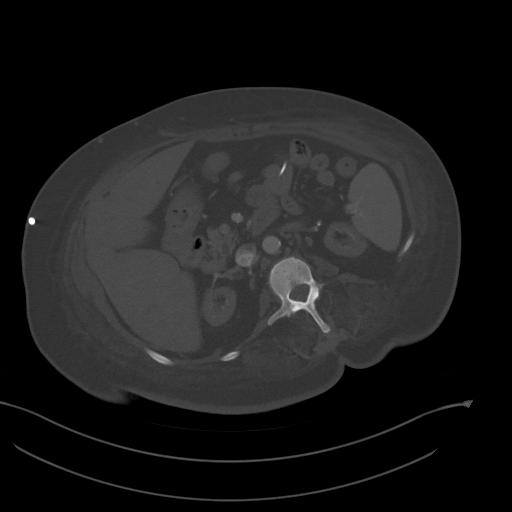
[im 66/91  soft-tissue]
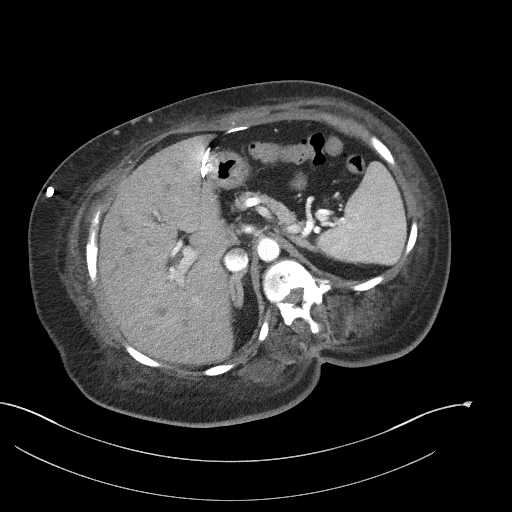
[im 70/91  soft-tissue]
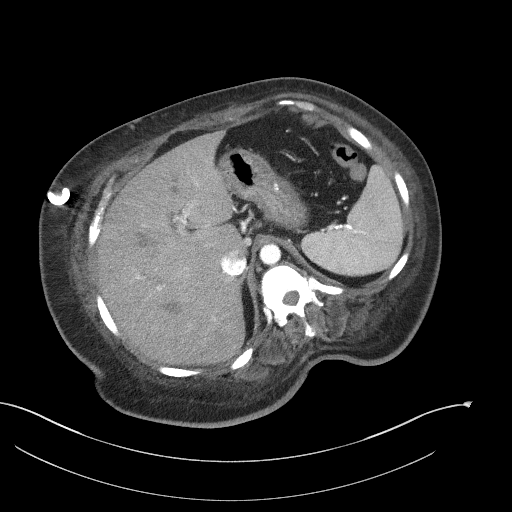
[im 78/91  soft-tissue]
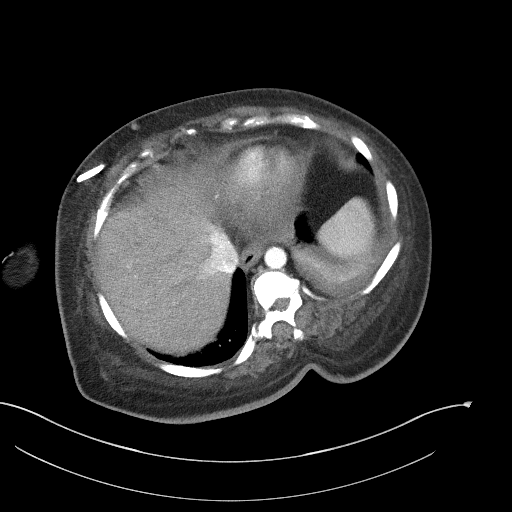
[im 86/91  soft-tissue]
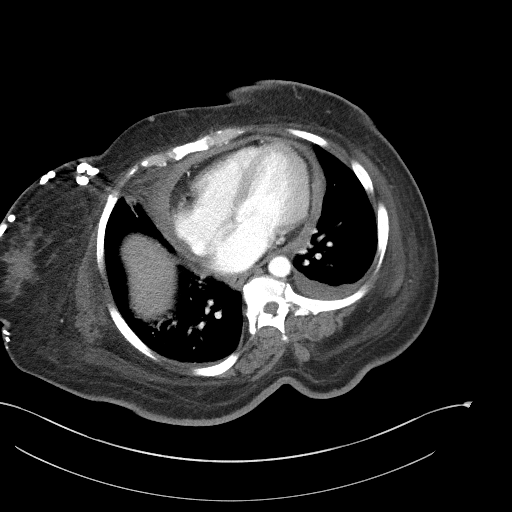

[Series 5: coronal st · coronal · 0.80mm/px · 3 of 117 slices shown]
[im 39/117  soft-tissue]
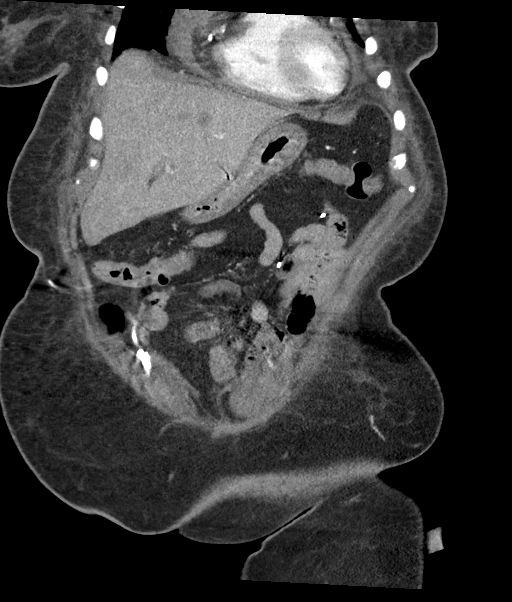
[im 52/117  soft-tissue]
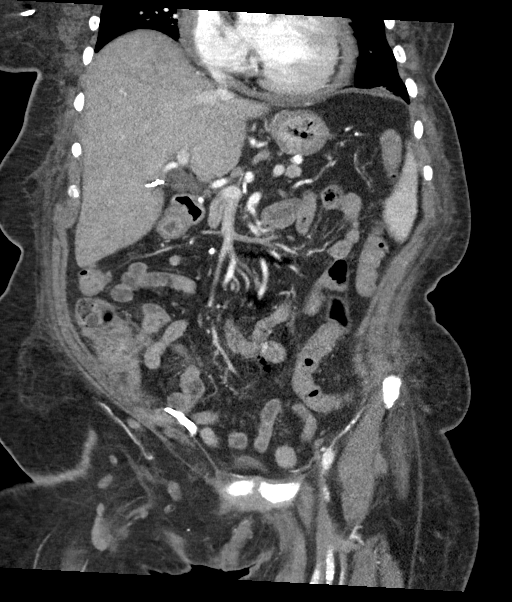
[im 65/117  soft-tissue]
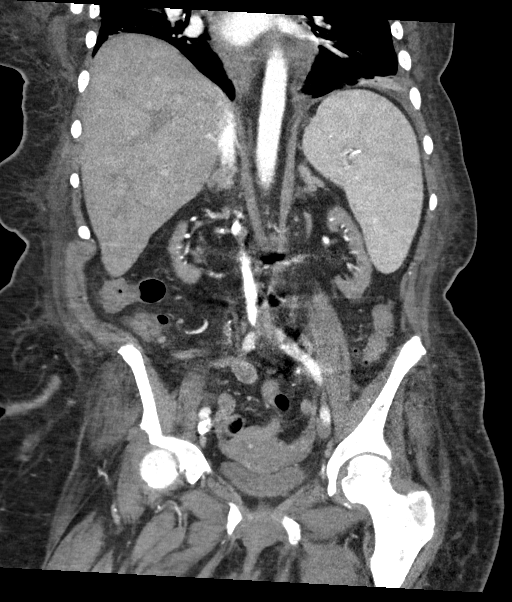

[16 of 46 positions shown; findings below may reference images not displayed]

FINDINGS: Lower chest: Small left pleural effusion. Moderate pericardial
effusion. Coronary artery calcifications. Atelectasis in the lung
bases.

Hepatobiliary: No focal liver abnormality is seen. Status post
cholecystectomy. No biliary dilatation.

Pancreas: Unremarkable. No pancreatic ductal dilatation or
surrounding inflammatory changes.

Spleen: Normal in size without focal abnormality.

Adrenals/Urinary Tract: No adrenal gland nodules. Kidneys are
atrophic. No hydronephrosis or hydroureter. subcentimeter renal
cysts bilaterally. Bladder is decompressed.

Stomach/Bowel: Stomach and small bowel are mostly decompressed.
Colon is mostly decompressed with scattered stool. Colonic
diverticulosis. No evidence of diverticulitis. Appendix is normal.

Vascular/Lymphatic: Normal caliber abdominal aorta. No aneurysm.
Arteriovenous fistula in the right groin region. No significant
lymphadenopathy.

Reproductive: Uterus and bilateral adnexa are unremarkable.

Other: No free air or free fluid in the abdomen. Mild diffuse soft
tissue edema. Periumbilical hernia containing fat.Generator pack in
the lower abdominal wall with stimulator lead extending to the
gastric region.

Musculoskeletal: Postoperative changes in the lower lumbar spine
with posterior fixation from L3 to the sacrum. Bone sclerosis likely
representing renal osteodystrophy.
IMPRESSION: 1. Small left pleural effusion.  Moderate pericardial effusion.
2. No evidence of bowel obstruction or inflammation. Colonic
diverticulosis without diverticulitis.
3. Bilateral renal atrophy. arteriovenous fistula in the right groin
region. Probable bony changes of renal osteodystrophy.

## 2018-03-13 MED ORDER — ISOSORBIDE MONONITRATE SR 30 MG 24 HR TAB
30 mg | ORAL_TABLET | ORAL | 0 refills | Status: DC
Start: 2018-03-13 — End: 2018-06-19

## 2018-06-19 MED ORDER — ISOSORBIDE MONONITRATE SR 30 MG 24 HR TAB
30 mg | ORAL_TABLET | ORAL | 0 refills | Status: DC
Start: 2018-06-19 — End: 2018-07-11

## 2018-07-11 MED ORDER — ISOSORBIDE MONONITRATE SR 30 MG 24 HR TAB
30 mg | ORAL_TABLET | ORAL | 0 refills | Status: DC
Start: 2018-07-11 — End: 2018-10-13

## 2018-10-13 MED ORDER — ISOSORBIDE MONONITRATE SR 30 MG 24 HR TAB
30 mg | ORAL_TABLET | ORAL | 0 refills | Status: DC
Start: 2018-10-13 — End: 2019-09-08

## 2019-01-14 NOTE — Progress Notes (Signed)
RECEIVED REFILL FOR IMDUR. PT HAS NOT BEEN IN OFFICE SINCE 03/15/16 AND WAS TO F/U IN 6 MONTHS. WROTE NOTE TO PHARMACY STATING PT NEEDS TO CALL OFFICE FOR AN APPT. NO REFILLS UNTIL SEEN IN OFFICE. FAXED TO [facility] AT [phone] AND RECEIVED FAX CONFIRMATION.

## 2019-01-14 NOTE — Progress Notes (Signed)
RECEIVED REFILL FOR IMDUR. PT HAS NOT BEEN IN OFFICE SINCE 03/15/16 AND WAS TO F/U IN 6 MONTHS. WROTE NOTE TO PHARMACY STATING PT NEEDS TO CALL OFFICE FOR AN APPT. NO REFILLS UNTIL SEEN IN OFFICE. FAXED TO [facility] AT [phone] AND RECEIVED FAX CONFIRMATION.

## 2019-04-16 ENCOUNTER — Other Ambulatory Visit: Payer: Self-pay

## 2019-04-16 ENCOUNTER — Ambulatory Visit (INDEPENDENT_AMBULATORY_CARE_PROVIDER_SITE_OTHER): Payer: 59 | Admitting: Otolaryngology

## 2019-04-16 DIAGNOSIS — H6981 Other specified disorders of Eustachian tube, right ear: Secondary | ICD-10-CM | POA: Diagnosis not present

## 2019-04-16 DIAGNOSIS — J310 Chronic rhinitis: Secondary | ICD-10-CM

## 2019-04-16 DIAGNOSIS — J343 Hypertrophy of nasal turbinates: Secondary | ICD-10-CM

## 2019-04-16 DIAGNOSIS — H903 Sensorineural hearing loss, bilateral: Secondary | ICD-10-CM

## 2019-04-16 DIAGNOSIS — H9011 Conductive hearing loss, unilateral, right ear, with unrestricted hearing on the contralateral side: Secondary | ICD-10-CM

## 2019-05-28 ENCOUNTER — Ambulatory Visit (INDEPENDENT_AMBULATORY_CARE_PROVIDER_SITE_OTHER): Payer: 59 | Admitting: Otolaryngology

## 2019-09-08 MED ORDER — ISOSORBIDE MONONITRATE SR 30 MG 24 HR TAB
30 mg | ORAL_TABLET | ORAL | 0 refills | Status: DC
Start: 2019-09-08 — End: 2020-01-31

## 2020-02-01 MED ORDER — ISOSORBIDE MONONITRATE SR 30 MG 24 HR TAB
30 mg | ORAL_TABLET | ORAL | 0 refills | Status: DC
Start: 2020-02-01 — End: 2020-05-04

## 2020-05-04 MED ORDER — ISOSORBIDE MONONITRATE SR 30 MG 24 HR TAB
30 mg | ORAL_TABLET | ORAL | 0 refills | Status: DC
Start: 2020-05-04 — End: 2020-08-09

## 2020-08-09 MED ORDER — ISOSORBIDE MONONITRATE SR 30 MG 24 HR TAB
30 mg | ORAL_TABLET | ORAL | 0 refills | Status: AC
Start: 2020-08-09 — End: ?

## 2021-04-08 ENCOUNTER — Encounter (HOSPITAL_COMMUNITY): Payer: Self-pay | Admitting: Radiology

## 2021-04-08 ENCOUNTER — Other Ambulatory Visit: Payer: Self-pay

## 2021-04-08 ENCOUNTER — Emergency Department (HOSPITAL_COMMUNITY)
Admission: EM | Admit: 2021-04-08 | Discharge: 2021-04-08 | Disposition: A | Discharge status: Home / Self Care | Payer: 59 | Attending: Emergency Medicine | Admitting: Emergency Medicine

## 2021-04-08 ENCOUNTER — Emergency Department (HOSPITAL_COMMUNITY): Payer: 59

## 2021-04-08 DIAGNOSIS — Z7982 Long term (current) use of aspirin: Secondary | ICD-10-CM | POA: Insufficient documentation

## 2021-04-08 DIAGNOSIS — E1122 Type 2 diabetes mellitus with diabetic chronic kidney disease: Secondary | ICD-10-CM | POA: Insufficient documentation

## 2021-04-08 DIAGNOSIS — Z794 Long term (current) use of insulin: Secondary | ICD-10-CM | POA: Diagnosis not present

## 2021-04-08 DIAGNOSIS — N186 End stage renal disease: Secondary | ICD-10-CM | POA: Insufficient documentation

## 2021-04-08 DIAGNOSIS — Z992 Dependence on renal dialysis: Secondary | ICD-10-CM | POA: Insufficient documentation

## 2021-04-08 DIAGNOSIS — L97513 Non-pressure chronic ulcer of other part of right foot with necrosis of muscle: Secondary | ICD-10-CM | POA: Diagnosis not present

## 2021-04-08 DIAGNOSIS — Z7902 Long term (current) use of antithrombotics/antiplatelets: Secondary | ICD-10-CM | POA: Diagnosis not present

## 2021-04-08 DIAGNOSIS — X58XXXA Exposure to other specified factors, initial encounter: Secondary | ICD-10-CM | POA: Diagnosis not present

## 2021-04-08 DIAGNOSIS — I120 Hypertensive chronic kidney disease with stage 5 chronic kidney disease or end stage renal disease: Secondary | ICD-10-CM | POA: Diagnosis not present

## 2021-04-08 DIAGNOSIS — S90821A Blister (nonthermal), right foot, initial encounter: Secondary | ICD-10-CM | POA: Diagnosis not present

## 2021-04-08 NOTE — ED Triage Notes (Signed)
Pt states she has ulcers to bil feet x1 month. Provider is doing dressing changes. Pt and family her for a second opinion as to why her feet continue to develop blisters. 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

## 2021-04-08 NOTE — ED Provider Notes (Signed)
[facility] EMERGENCY DEPARTMENT Provider Note   CSN: [id number] Arrival date & time: 04/08/21  0040     History Chief Complaint  Patient presents with   Foot Ulcer    Kayla Gibson is a 60 y.o. female.  The history is provided by the patient.  Patient with history of diabetes, end-stage renal disease, peripheral vascular disease presents with ulcers on her feet.  Patient presents with family member.  She reports she has had ulcers to both of her feet for several weeks.  She has been getting evaluated by a podiatrist in [place].  She reports tonight and noted that the toes on her right foot were beginning to swell.  No recent trauma is reported the patient has a history of neuropathy.  She has very minimal feeling in either foot.  No fevers or chills.  No chest pain or shortness of breath.  Patient reports she received dialysis earlier on Friday without any difficulty  Patient reports she had decreased blood flow to both feet.  She reports she had a recent evaluation that revealed vascular occlusions.  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   Osteomyelitis ([place]) 2009    Patient Active Problem List   Diagnosis Date Noted   ESRD on hemodialysis ([place])    HCAP (healthcare-associated pneumonia) 02/27/2014   Hypoxia 02/27/2014   Chest pain 02/27/2014   Hypotension 11/07/2011   Gastroparesis 11/05/2011   Abnormal CT of the abdomen 11/05/2011   Chronic osteomyelitis 11/05/2011   Gastroenteritis/enteritis 11/04/2011   ESRD (end stage renal disease) ([place]) 11/04/2011   HTN (hypertension) 11/04/2011   Diabetes mellitus type II, uncontrolled 11/04/2011   Depression 11/04/2011    Past Surgical History:  Procedure Laterality Date   AV FISTULA PLACEMENT     left arm   BACK SURGERY      CHOLECYSTECTOMY     COLONOSCOPY  11/06/2011   Procedure: COLONOSCOPY;  Surgeon: Rogene Houston, MD;  Location: [facility];  Service: Endoscopy;  Laterality: N/A;   PACEMAKER INSERTION     for gastroparesis     OB History   No obstetric history on file.     Family History  Problem Relation Age of Onset   Cancer Mother 23       stomach   Diabetes Mother    Stroke Father 23   Heart failure Father    Diabetes Sister    Hypertension Sister     Social History   Tobacco Use   Smoking status: Never   Smokeless tobacco: Never  Vaping Use   Vaping Use: Never used  Substance Use Topics   Alcohol use: No   Drug use: No    Home Medications Prior to Admission medications   Medication Sig Start Date End Date Taking? Authorizing Provider  aspirin EC 81 MG tablet Take 81 mg by mouth every morning.     [provider]  calcium acetate (PHOSLO) 667 MG capsule Take 667 mg by mouth 3 (three) times daily.      [provider]  carvedilol (COREG) 25 MG tablet Take 25 mg by mouth 2 (two) times daily with a meal.      [provider]  cinacalcet (SENSIPAR) 30 MG tablet Take 30 mg  by mouth every evening.      [provider]  clopidogrel (PLAVIX) 75 MG tablet Take 75 mg by mouth daily.    [provider]  famotidine (PEPCID) 20 MG tablet Take 20 mg by mouth 2 (two) times daily.    [provider]  fenofibrate (TRICOR) 48 MG tablet Take 48 mg by mouth daily.    [provider]  hydrALAZINE (APRESOLINE) 50 MG tablet Take 50 mg by mouth 4 (four) times daily.    [provider]  insulin glargine (LANTUS) 100 UNIT/ML injection Inject 25 Units into the skin at bedtime.      [provider]  insulin lispro (HUMALOG) 100 UNIT/ML injection Inject 15 Units into the skin 3 (three) times daily before meals.      [provider]  isosorbide mononitrate (IMDUR) 30 MG 24 hr tablet Take 30 mg by mouth daily.     [provider]  ondansetron (ZOFRAN) 4 MG tablet Take 1 tablet (4 mg total) by mouth every 6 (six) hours. 10/26/16   Orpah Greek, MD  pantoprazole (PROTONIX) 40 MG tablet Take 1 tablet (40 mg total) by mouth daily. 03/04/14   Samuella Cota, MD  pravastatin (PRAVACHOL) 20 MG tablet Take 20 mg by mouth every morning.     [provider]  pregabalin (LYRICA) 75 MG capsule Take 75 mg by mouth daily.     [provider]  promethazine (PHENERGAN) 25 MG suppository Place 1 suppository (25 mg total) rectally every 6 (six) hours as needed for nausea. 11/20/11 05/08/12  Prentiss Bells, MD  QUEtiapine Fumarate (SEROQUEL XR) 150 MG 24 hr tablet Take 150 mg by mouth at bedtime.      [provider]  sevelamer (RENVELA) 800 MG tablet Take 800 mg by mouth 3 (three) times daily with meals.      [provider]  sucroferric oxyhydroxide (VELPHORO) 500 MG chewable tablet Chew 500 mg by mouth 3 (three) times daily with meals.    [provider]  traMADol (ULTRAM) 50 MG tablet Take 50 mg by mouth daily as needed. For back  pain     [provider]    Allergies    Dilaudid [hydromorphone hcl], Hydrocodone-acetaminophen, Tylenol [acetaminophen], Ace inhibitors, Alprazolam, Bee venom, and Zolpidem tartrate  Review of Systems   Review of Systems  Constitutional:  Negative for chills and fever.  Respiratory:  Negative for shortness of breath.   Cardiovascular:  Negative for chest pain.  Musculoskeletal:  Positive for joint swelling.  Skin:  Positive for wound.  All other systems reviewed and are negative.  Physical Exam Updated Vital Signs BP (!) 141/57 (BP Location: Right Arm)   Pulse 80   Temp 98.7 F (37.1 C) (Oral)   Resp 16   Ht 1.575 m (5\' 2" )   Wt 81.6 kg   SpO2 99%   BMI 32.92 kg/m   Physical Exam CONSTITUTIONAL: Chronically ill-appearing, appears older than stated age HEAD: Normocephalic/atraumatic EYES: EOMI/ ENMT:  Mucous membranes moist CV: S1/S2 noted LUNGS: Lungs are clear to auscultation bilaterally ABDOMEN: soft, nontender NEURO: Pt is awake/alert/appropriate, moves all extremitiesx4.  No facial droop.   EXTREMITIES: Dialysis access to left arm with thrill noted The right foot has a strong DP pulse.  The right foot is warm.  There is swelling noted on her toes.  Ulcer noted to the plantar surface of right foot see photo below The left foot is warm to touch but  there is no DP/PT palpable pulse.  necrotic lesions are noted to the toes.  See photo below SKIN: warm, color normal, see photo below PSYCH: no abnormalities of mood noted, alert and oriented to situation        ED Results / Procedures / Treatments   Labs (all labs ordered are listed, but only abnormal results are displayed) Labs Reviewed - No data to display  EKG None  Radiology DG Foot 2 Views Right  Result Date: 04/08/2021 CLINICAL DATA:  Right foot pain. EXAM: RIGHT FOOT - 2 VIEW COMPARISON:  None. FINDINGS: There is no acute fracture or dislocation. The bones are osteopenic. Fixation hardware of the distal tibia extending into the talar and calcaneal bone. There is lucency adjacent to the tip of the hardware as well as the screw in the calcaneus suggestive of loosening. Degenerative changes of the ankle. There is soft tissue swelling of the forefoot. Vascular calcifications. IMPRESSION: 1. No acute fracture or dislocation. 2. Fixation hardware of the distal tibia extending into the hindfoot. There is lucency adjacent to the tip of the hardware as well as the screw in the calcaneus suggestive of loosening. Electronically Signed   By: Anner Crete M.D.   On: 04/08/2021 02:25    Procedures Procedures   Medications Ordered in ED Medications - No data to display  ED Course  I have reviewed the triage vital signs and the nursing notes.  Pertinent  imaging results that were available during my care of the patient were  reviewed by me and considered in my medical decision making (see chart for details).    MDM Rules/Calculators/A&P                           Patient with history of end-stage renal disease and peripheral vascular disease presents with ulcers to her feet she has had for several weeks.  Her big concern tonight was the blisters that developed on her second and third toe of the right foot. X-ray was performed there is no acute bony issue at this time. She has a pulse in the right foot. There is no obvious abscess. At this point I would not change her course of treatment has been directed by the podiatrist.  She already received vancomycin with her dialysis sessions.  I would not add on any further antibiotics or do any further imaging She already has bandaging on the left foot to help with healing from her podiatrist.  I was honest with patient that she may require further surgeries as these wounds may not heal.  However at this time, there is no emergent issue Patient and family are agreeable with plan Final Clinical Impression(s) / ED Diagnoses Final diagnoses:  Blister (nonthermal), right foot, initial encounter    Rx / DC Orders ED Discharge Orders     None        Ripley Fraise, MD 04/08/21 [phone]

## 2022-07-19 IMAGING — DX DG FOOT 2V*R*
2 series · 2 of 2 positions shown · non-contrast
Comparison: None.

CLINICAL DATA: Right foot pain.

EXAM:
RIGHT FOOT - 2 VIEW

[foot ap]
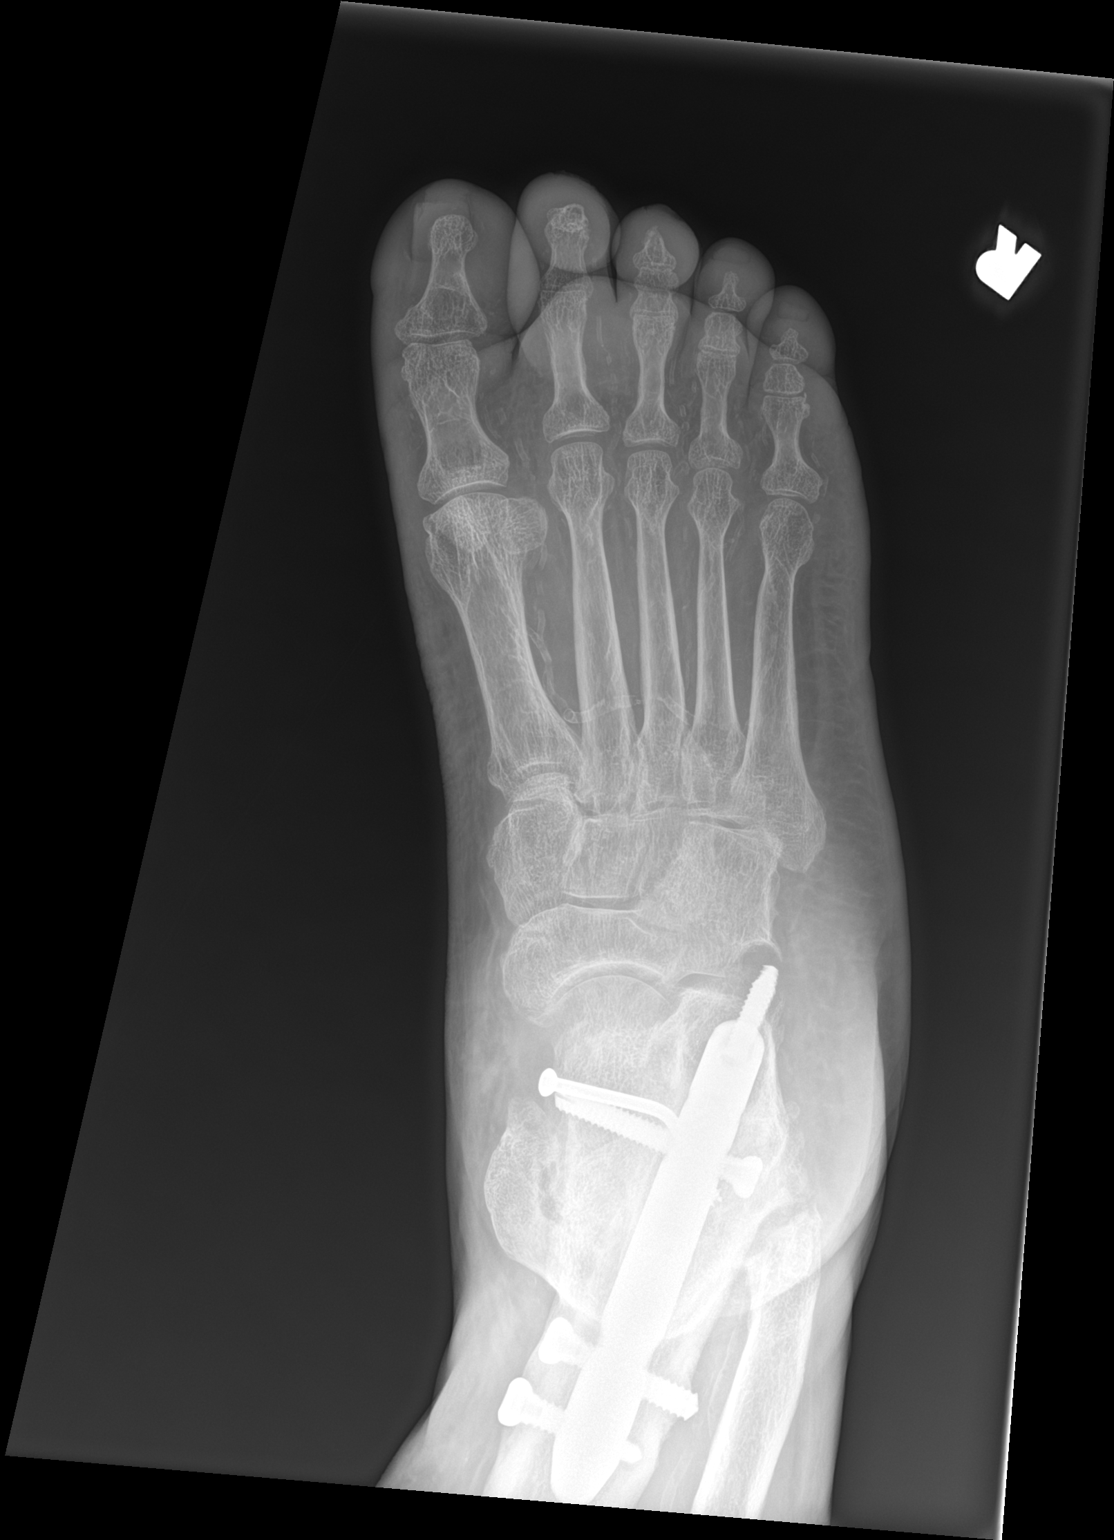

[foot lat]
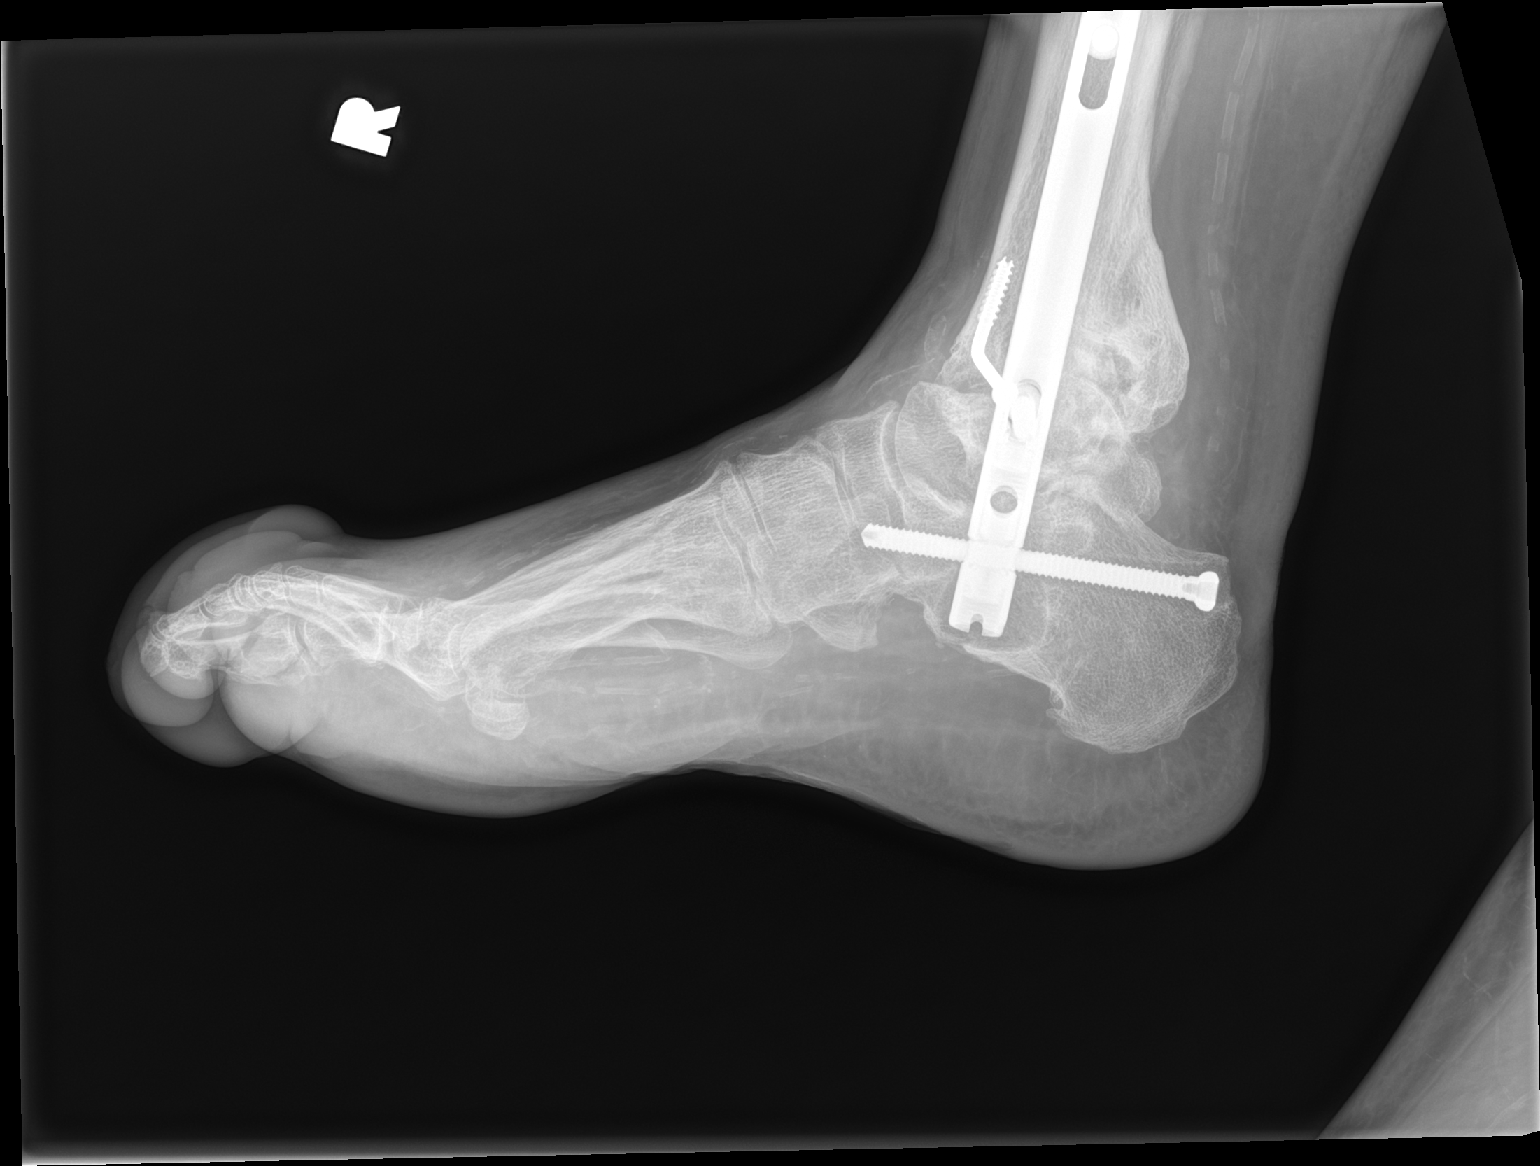

[2 of 2 positions shown; findings below may reference images not displayed]

FINDINGS: There is no acute fracture or dislocation. The bones are osteopenic.
Fixation hardware of the distal tibia extending into the talar and
calcaneal bone. There is lucency adjacent to the tip of the hardware
as well as the screw in the calcaneus suggestive of loosening.
Degenerative changes of the ankle. There is soft tissue swelling of
the forefoot. Vascular calcifications.
IMPRESSION: 1. No acute fracture or dislocation.
2. Fixation hardware of the distal tibia extending into the
hindfoot. There is lucency adjacent to the tip of the hardware as
well as the screw in the calcaneus suggestive of loosening.

## 2023-11-30 ENCOUNTER — Inpatient Hospital Stay (HOSPITAL_COMMUNITY)
Admission: EM | Admit: 2023-11-30 | Discharge: 2023-12-05 | DRG: 871 | Disposition: A | Discharge status: Home / Self Care | Attending: Internal Medicine | Admitting: Internal Medicine

## 2023-11-30 ENCOUNTER — Emergency Department (HOSPITAL_COMMUNITY)

## 2023-11-30 DIAGNOSIS — Z7985 Long-term (current) use of injectable non-insulin antidiabetic drugs: Secondary | ICD-10-CM

## 2023-11-30 DIAGNOSIS — Z89511 Acquired absence of right leg below knee: Secondary | ICD-10-CM

## 2023-11-30 DIAGNOSIS — I9589 Other hypotension: Secondary | ICD-10-CM | POA: Diagnosis present

## 2023-11-30 DIAGNOSIS — Z8249 Family history of ischemic heart disease and other diseases of the circulatory system: Secondary | ICD-10-CM

## 2023-11-30 DIAGNOSIS — E1122 Type 2 diabetes mellitus with diabetic chronic kidney disease: Secondary | ICD-10-CM | POA: Diagnosis present

## 2023-11-30 DIAGNOSIS — E782 Mixed hyperlipidemia: Secondary | ICD-10-CM | POA: Diagnosis present

## 2023-11-30 DIAGNOSIS — Z79899 Other long term (current) drug therapy: Secondary | ICD-10-CM

## 2023-11-30 DIAGNOSIS — Z170 Estrogen receptor positive status [ER+]: Secondary | ICD-10-CM

## 2023-11-30 DIAGNOSIS — G9341 Metabolic encephalopathy: Secondary | ICD-10-CM | POA: Diagnosis present

## 2023-11-30 DIAGNOSIS — I959 Hypotension, unspecified: Secondary | ICD-10-CM | POA: Diagnosis present

## 2023-11-30 DIAGNOSIS — A419 Sepsis, unspecified organism: Principal | ICD-10-CM | POA: Diagnosis present

## 2023-11-30 DIAGNOSIS — Z87892 Personal history of anaphylaxis: Secondary | ICD-10-CM

## 2023-11-30 DIAGNOSIS — E1169 Type 2 diabetes mellitus with other specified complication: Secondary | ICD-10-CM | POA: Diagnosis present

## 2023-11-30 DIAGNOSIS — Z992 Dependence on renal dialysis: Secondary | ICD-10-CM

## 2023-11-30 DIAGNOSIS — K3184 Gastroparesis: Secondary | ICD-10-CM | POA: Diagnosis present

## 2023-11-30 DIAGNOSIS — Z885 Allergy status to narcotic agent status: Secondary | ICD-10-CM

## 2023-11-30 DIAGNOSIS — L89319 Pressure ulcer of right buttock, unspecified stage: Secondary | ICD-10-CM | POA: Diagnosis present

## 2023-11-30 DIAGNOSIS — Z89432 Acquired absence of left foot: Secondary | ICD-10-CM

## 2023-11-30 DIAGNOSIS — Z7902 Long term (current) use of antithrombotics/antiplatelets: Secondary | ICD-10-CM

## 2023-11-30 DIAGNOSIS — Z89512 Acquired absence of left leg below knee: Secondary | ICD-10-CM | POA: Diagnosis not present

## 2023-11-30 DIAGNOSIS — C50912 Malignant neoplasm of unspecified site of left female breast: Secondary | ICD-10-CM | POA: Diagnosis present

## 2023-11-30 DIAGNOSIS — Z888 Allergy status to other drugs, medicaments and biological substances status: Secondary | ICD-10-CM

## 2023-11-30 DIAGNOSIS — D631 Anemia in chronic kidney disease: Secondary | ICD-10-CM | POA: Diagnosis present

## 2023-11-30 DIAGNOSIS — I120 Hypertensive chronic kidney disease with stage 5 chronic kidney disease or end stage renal disease: Secondary | ICD-10-CM | POA: Diagnosis present

## 2023-11-30 DIAGNOSIS — N186 End stage renal disease: Secondary | ICD-10-CM | POA: Diagnosis present

## 2023-11-30 DIAGNOSIS — J181 Lobar pneumonia, unspecified organism: Secondary | ICD-10-CM | POA: Diagnosis present

## 2023-11-30 DIAGNOSIS — A4189 Other specified sepsis: Secondary | ICD-10-CM | POA: Diagnosis not present

## 2023-11-30 DIAGNOSIS — Z7982 Long term (current) use of aspirin: Secondary | ICD-10-CM | POA: Diagnosis not present

## 2023-11-30 DIAGNOSIS — E1165 Type 2 diabetes mellitus with hyperglycemia: Secondary | ICD-10-CM | POA: Diagnosis present

## 2023-11-30 DIAGNOSIS — R6521 Severe sepsis with septic shock: Secondary | ICD-10-CM | POA: Diagnosis present

## 2023-11-30 DIAGNOSIS — J9611 Chronic respiratory failure with hypoxia: Secondary | ICD-10-CM | POA: Diagnosis present

## 2023-11-30 DIAGNOSIS — E1143 Type 2 diabetes mellitus with diabetic autonomic (poly)neuropathy: Secondary | ICD-10-CM | POA: Diagnosis present

## 2023-11-30 DIAGNOSIS — Z833 Family history of diabetes mellitus: Secondary | ICD-10-CM

## 2023-11-30 DIAGNOSIS — E1151 Type 2 diabetes mellitus with diabetic peripheral angiopathy without gangrene: Secondary | ICD-10-CM | POA: Diagnosis present

## 2023-11-30 DIAGNOSIS — E785 Hyperlipidemia, unspecified: Secondary | ICD-10-CM | POA: Diagnosis not present

## 2023-11-30 DIAGNOSIS — E66812 Obesity, class 2: Secondary | ICD-10-CM | POA: Diagnosis present

## 2023-11-30 DIAGNOSIS — Z886 Allergy status to analgesic agent status: Secondary | ICD-10-CM

## 2023-11-30 DIAGNOSIS — R112 Nausea with vomiting, unspecified: Secondary | ICD-10-CM

## 2023-11-30 DIAGNOSIS — Z89519 Acquired absence of unspecified leg below knee: Secondary | ICD-10-CM

## 2023-11-30 DIAGNOSIS — Z853 Personal history of malignant neoplasm of breast: Secondary | ICD-10-CM | POA: Diagnosis not present

## 2023-11-30 DIAGNOSIS — Z950 Presence of cardiac pacemaker: Secondary | ICD-10-CM

## 2023-11-30 DIAGNOSIS — Z823 Family history of stroke: Secondary | ICD-10-CM

## 2023-11-30 DIAGNOSIS — N250 Renal osteodystrophy: Secondary | ICD-10-CM | POA: Diagnosis present

## 2023-11-30 DIAGNOSIS — Z883 Allergy status to other anti-infective agents status: Secondary | ICD-10-CM

## 2023-11-30 DIAGNOSIS — Z6839 Body mass index (BMI) 39.0-39.9, adult: Secondary | ICD-10-CM

## 2023-11-30 DIAGNOSIS — I440 Atrioventricular block, first degree: Secondary | ICD-10-CM | POA: Diagnosis present

## 2023-11-30 LAB — COMPREHENSIVE METABOLIC PANEL WITH GFR
ALT: 7 U/L (ref 0–44)
AST: 26 U/L (ref 15–41)
Albumin: 3 g/dL — ABNORMAL LOW (ref 3.5–5.0)
Alkaline Phosphatase: 96 U/L (ref 38–126)
Anion gap: 15 (ref 5–15)
BUN: 34 mg/dL — ABNORMAL HIGH (ref 6–20)
CO2: 24 mmol/L (ref 22–32)
Calcium: 7 mg/dL — ABNORMAL LOW (ref 8.9–10.3)
Chloride: 98 mmol/L (ref 98–111)
Creatinine, Ser: 6.6 mg/dL — ABNORMAL HIGH (ref 0.44–1.00)
GFR, Estimated: 7 mL/min — ABNORMAL LOW (ref >60)
Glucose, Bld: 152 mg/dL — ABNORMAL HIGH (ref 70–99)
Potassium: 4.2 mmol/L (ref 3.5–5.1)
Sodium: 137 mmol/L (ref 135–145)
Total Bilirubin: 0.7 mg/dL (ref 0.0–1.2)
Total Protein: 7.4 g/dL (ref 6.5–8.1)

## 2023-11-30 LAB — CBC WITH DIFFERENTIAL/PLATELET
Abs Immature Granulocytes: 0.03 10*3/uL (ref 0.00–0.07)
Basophils Absolute: 0 10*3/uL (ref 0.0–0.1)
Basophils Relative: 0 %
Eosinophils Absolute: 0.1 10*3/uL (ref 0.0–0.5)
Eosinophils Relative: 1 %
HCT: 26.2 % — ABNORMAL LOW (ref 36.0–46.0)
Hemoglobin: 8.6 g/dL — ABNORMAL LOW (ref 12.0–15.0)
Immature Granulocytes: 0 %
Lymphocytes Relative: 16 %
Lymphs Abs: 1.4 10*3/uL (ref 0.7–4.0)
MCH: 33.5 pg (ref 26.0–34.0)
MCHC: 32.8 g/dL (ref 30.0–36.0)
MCV: 101.9 fL — ABNORMAL HIGH (ref 80.0–100.0)
Monocytes Absolute: 0.5 10*3/uL (ref 0.1–1.0)
Monocytes Relative: 6 %
Neutro Abs: 6.3 10*3/uL (ref 1.7–7.7)
Neutrophils Relative %: 77 %
Platelets: 144 10*3/uL — ABNORMAL LOW (ref 150–400)
RBC: 2.57 MIL/uL — ABNORMAL LOW (ref 3.87–5.11)
RDW: 14.6 % (ref 11.5–15.5)
WBC: 8.3 10*3/uL (ref 4.0–10.5)
nRBC: 0 % (ref 0.0–0.2)

## 2023-11-30 LAB — BLOOD GAS, VENOUS
Acid-Base Excess: 1.2 mmol/L (ref 0.0–2.0)
Bicarbonate: 29.6 mmol/L — ABNORMAL HIGH (ref 20.0–28.0)
Drawn by: 27160
O2 Saturation: 53 %
Patient temperature: 37.5
pCO2, Ven: 64 mmHg — ABNORMAL HIGH (ref 44–60)
pH, Ven: 7.27 (ref 7.25–7.43)
pO2, Ven: 34 mmHg (ref 32–45)

## 2023-11-30 LAB — GLUCOSE, CAPILLARY: Glucose-Capillary: 90 mg/dL (ref 70–99)

## 2023-11-30 LAB — PROTIME-INR
INR: 1.1 (ref 0.8–1.2)
Prothrombin Time: 14.1 s (ref 11.4–15.2)

## 2023-11-30 LAB — LACTIC ACID, PLASMA
Lactic Acid, Venous: 1.6 mmol/L (ref 0.5–1.9)
Lactic Acid, Venous: 1.4 mmol/L (ref 0.5–1.9)

## 2023-11-30 LAB — MRSA NEXT GEN BY PCR, NASAL: MRSA by PCR Next Gen: NOT DETECTED

## 2023-11-30 LAB — BRAIN NATRIURETIC PEPTIDE: B Natriuretic Peptide: 205 pg/mL — ABNORMAL HIGH (ref 0.0–100.0)

## 2023-11-30 LAB — MAGNESIUM: Magnesium: 2.1 mg/dL (ref 1.7–2.4)

## 2023-11-30 MED ORDER — INSULIN GLARGINE-YFGN 100 UNIT/ML ~~LOC~~ SOLN
15.0000 [IU] | Freq: Every day | SUBCUTANEOUS | Status: DC
  Administered 2023-11-30 – 2023-12-04 (×5): 15 [IU] via SUBCUTANEOUS
  Filled 2023-11-30 (×6): qty 0.15

## 2023-11-30 MED ORDER — TRAMADOL HCL 50 MG PO TABS: 50.0000 mg | ORAL_TABLET | Freq: Two times a day (BID) | ORAL | Status: DC | PRN

## 2023-11-30 MED ORDER — ATORVASTATIN CALCIUM 40 MG PO TABS
40.0000 mg | ORAL_TABLET | Freq: Every day | ORAL | Status: DC
  Administered 2023-11-30 – 2023-12-05 (×6): 40 mg via ORAL
  Filled 2023-11-30 (×6): qty 1

## 2023-11-30 MED ORDER — ONDANSETRON HCL 4 MG/2ML IJ SOLN
4.0000 mg | Freq: Once | INTRAMUSCULAR | Status: AC
  Administered 2023-11-30: 4 mg via INTRAVENOUS
  Filled 2023-11-30: qty 2

## 2023-11-30 MED ORDER — SODIUM CHLORIDE 0.9 % IV SOLN
2.0000 g | Freq: Once | INTRAVENOUS | Status: AC
  Administered 2023-11-30: 2 g via INTRAVENOUS
  Filled 2023-11-30: qty 12.5

## 2023-11-30 MED ORDER — VANCOMYCIN HCL IN DEXTROSE 1-5 GM/200ML-% IV SOLN
1000.0000 mg | INTRAVENOUS | Status: DC
  Administered 2023-12-02: 1000 mg via INTRAVENOUS
  Filled 2023-11-30: qty 200

## 2023-11-30 MED ORDER — SODIUM CHLORIDE 0.9 % IV BOLUS
1000.0000 mL | Freq: Once | INTRAVENOUS | Status: AC
  Administered 2023-11-30: 1000 mL via INTRAVENOUS

## 2023-11-30 MED ORDER — CINACALCET HCL 30 MG PO TABS
30.0000 mg | ORAL_TABLET | Freq: Every evening | ORAL | Status: DC
  Administered 2023-11-30: 30 mg via ORAL
  Filled 2023-11-30 (×3): qty 1

## 2023-11-30 MED ORDER — METRONIDAZOLE 500 MG/100ML IV SOLN
500.0000 mg | Freq: Once | INTRAVENOUS | Status: AC
  Administered 2023-11-30: 500 mg via INTRAVENOUS
  Filled 2023-11-30: qty 100

## 2023-11-30 MED ORDER — NOREPINEPHRINE 4 MG/250ML-% IV SOLN
0.0000 ug/min | INTRAVENOUS | Status: DC
  Administered 2023-11-30: 2 ug/min via INTRAVENOUS
  Administered 2023-12-01: 9 ug/min via INTRAVENOUS
  Administered 2023-12-02: 2 ug/min via INTRAVENOUS
  Filled 2023-11-30 (×3): qty 250

## 2023-11-30 MED ORDER — RENAPLEX-D PO TABS: 1.0000 | ORAL_TABLET | Freq: Every evening | ORAL | Status: DC

## 2023-11-30 MED ORDER — DULOXETINE HCL 20 MG PO CPEP
20.0000 mg | ORAL_CAPSULE | Freq: Every day | ORAL | Status: DC
  Administered 2023-11-30 – 2023-12-05 (×6): 20 mg via ORAL
  Filled 2023-11-30 (×9): qty 1

## 2023-11-30 MED ORDER — CALCIUM ACETATE (PHOS BINDER) 667 MG PO CAPS
667.0000 mg | ORAL_CAPSULE | Freq: Three times a day (TID) | ORAL | Status: DC
  Administered 2023-12-01 – 2023-12-05 (×13): 667 mg via ORAL
  Filled 2023-11-30 (×13): qty 1

## 2023-11-30 MED ORDER — SODIUM CHLORIDE 0.9 % IV SOLN
1.0000 g | INTRAVENOUS | Status: DC
  Filled 2023-11-30 (×2): qty 10

## 2023-11-30 MED ORDER — FAMOTIDINE 20 MG PO TABS
20.0000 mg | ORAL_TABLET | Freq: Two times a day (BID) | ORAL | Status: DC
  Administered 2023-11-30 – 2023-12-01 (×2): 20 mg via ORAL
  Filled 2023-11-30 (×2): qty 1

## 2023-11-30 MED ORDER — MIDODRINE HCL 5 MG PO TABS
10.0000 mg | ORAL_TABLET | Freq: Three times a day (TID) | ORAL | Status: DC | PRN
  Administered 2023-11-30: 10 mg via ORAL
  Filled 2023-11-30: qty 2

## 2023-11-30 MED ORDER — SODIUM CHLORIDE 0.9 % IV BOLUS
500.0000 mL | Freq: Once | INTRAVENOUS | Status: AC
  Administered 2023-11-30: 500 mL via INTRAVENOUS

## 2023-11-30 MED ORDER — HEPARIN SODIUM (PORCINE) 5000 UNIT/ML IJ SOLN
5000.0000 [IU] | Freq: Three times a day (TID) | INTRAMUSCULAR | Status: DC
  Administered 2023-11-30 – 2023-12-05 (×13): 5000 [IU] via SUBCUTANEOUS
  Filled 2023-11-30 (×14): qty 1

## 2023-11-30 MED ORDER — ASPIRIN 81 MG PO TBEC
81.0000 mg | DELAYED_RELEASE_TABLET | Freq: Every morning | ORAL | Status: DC
  Administered 2023-12-01 – 2023-12-05 (×5): 81 mg via ORAL
  Filled 2023-11-30 (×5): qty 1

## 2023-11-30 MED ORDER — DORZOLAMIDE HCL-TIMOLOL MAL 2-0.5 % OP SOLN
1.0000 [drp] | Freq: Two times a day (BID) | OPHTHALMIC | Status: DC
  Filled 2023-11-30: qty 10

## 2023-11-30 MED ORDER — SODIUM CHLORIDE 0.9 % IV BOLUS: 500.0000 mL | Freq: Once | INTRAVENOUS | Status: DC

## 2023-11-30 MED ORDER — RENA-VITE PO TABS
1.0000 | ORAL_TABLET | Freq: Every day | ORAL | Status: DC
  Administered 2023-11-30 – 2023-12-04 (×5): 1 via ORAL
  Filled 2023-11-30 (×5): qty 1

## 2023-11-30 MED ORDER — CALCITRIOL 0.25 MCG PO CAPS
0.5000 ug | ORAL_CAPSULE | Freq: Two times a day (BID) | ORAL | Status: DC
  Administered 2023-11-30 – 2023-12-05 (×9): 0.5 ug via ORAL
  Filled 2023-11-30 (×9): qty 2

## 2023-11-30 MED ORDER — VANCOMYCIN HCL 2000 MG/400ML IV SOLN
2000.0000 mg | Freq: Once | INTRAVENOUS | Status: AC
  Administered 2023-11-30: 2000 mg via INTRAVENOUS
  Filled 2023-11-30: qty 400

## 2023-11-30 MED ORDER — VANCOMYCIN HCL IN DEXTROSE 1-5 GM/200ML-% IV SOLN: 1000.0000 mg | Freq: Once | INTRAVENOUS | Status: DC

## 2023-11-30 MED ORDER — CHLORHEXIDINE GLUCONATE CLOTH 2 % EX PADS
6.0000 | MEDICATED_PAD | Freq: Every day | CUTANEOUS | Status: DC
  Administered 2023-11-30 – 2023-12-03 (×4): 6 via TOPICAL

## 2023-11-30 MED ORDER — ONDANSETRON HCL 4 MG/2ML IJ SOLN
4.0000 mg | Freq: Four times a day (QID) | INTRAMUSCULAR | Status: DC | PRN
  Administered 2023-11-30 – 2023-12-03 (×4): 4 mg via INTRAVENOUS
  Filled 2023-11-30 (×4): qty 2

## 2023-11-30 NOTE — ED Notes (Signed)
 Patient reporting she does not produce urine any longer due to being a dialysis patient.

## 2023-11-30 NOTE — ED Triage Notes (Signed)
 Pt arrived POV in front of the ED and was found slumped in the passenger seat. This RN and additional staff transferred pt to ED stretcher and taken to room 1.   Pt's husband present as primary contributor to HPI.   Pt with hx of ESRD. Pt had a procedure on her dialysis site on 6/12 at [facility] during that time she had some hypotension and they kept her overnight and then discharged. Yesterday pt started becoming weak and lethargic and she went to the ED in [place]. She was given IVF and discharged home at 01:30 this AM. Then at 13:30 today pt became weak and lethargic again.

## 2023-11-30 NOTE — Sepsis Progress Note (Signed)
 Sepsis protocol is being followed by [facility].

## 2023-11-30 NOTE — ED Provider Notes (Signed)
 [name] [facility] AT [facility] Provider Note   CSN: [id number] Arrival date & time: 11/30/23  1407     Patient presents with: Altered Mental Status   Kayla Gibson is a 63 y.o. female.   63 year old female with past medical history significant for ESRD who presents today for concern of mental status change.  Patient was just admitted at [facility] on 6/12 and discharged on 6/13 due to concern for hypotension.  While she was there her husband who was at bedside and provides most of the history states that she had a procedure on her dialysis fistula site in her left upper extremity on 6/12.  Unable to provide additional history on this procedure.  Patient was fine until around 1:30 PM today.  At baseline patient is able to ambulate with minimal assistance and is able to transfer without assistance.  She has not been able to do this since the weakness started.  She does wear supplemental oxygen as needed about 1 to 2 L.  According to husband patient has not had any recent cough, has not complained of any abdominal pain.  The history is provided by the patient. No language interpreter was used.       Prior to Admission medications   Medication Sig Start Date End Date Taking? Authorizing Provider  aspirin  EC 81 MG tablet Take 81 mg by mouth every morning.     [provider]  atorvastatin (LIPITOR) 40 MG tablet Take 40 mg by mouth daily. 05/10/20   [provider]  calcitRIOL (ROCALTROL) 0.5 MCG capsule Take 0.5 mcg by mouth 2 (two) times daily.    [provider]  calcium  acetate (PHOSLO ) 667 MG capsule Take 667 mg by mouth 3 (three) times daily.      [provider]  carvedilol  (COREG ) 25 MG tablet Take 25 mg by mouth 2 (two) times daily with a meal.      [provider]  cinacalcet  (SENSIPAR ) 30 MG tablet Take 30 mg by mouth every evening.      [provider]  clopidogrel  (PLAVIX ) 75 MG tablet Take 75 mg  by mouth daily.    [provider]  CYMBALTA 20 MG capsule Take 20 mg by mouth daily. 05/28/23   [provider]  dorzolamide-timolol (COSOPT) 2-0.5 % ophthalmic solution Place 1 drop into the right eye 2 (two) times daily. 11/26/22   [provider]  famotidine  (PEPCID ) 20 MG tablet Take 20 mg by mouth 2 (two) times daily.    [provider]  fenofibrate  (TRICOR ) 48 MG tablet Take 48 mg by mouth daily.    [provider]  gabapentin (NEURONTIN) 300 MG capsule Take 300 mg by mouth 2 (two) times daily.    [provider]  HUMALOG KWIKPEN 100 UNIT/ML KwikPen Inject 18 Units into the skin 3 (three) times daily.    [provider]  hydrALAZINE (APRESOLINE) 50 MG tablet Take 50 mg by mouth 4 (four) times daily.    [provider]  isosorbide  mononitrate (IMDUR ) 30 MG 24 hr tablet Take 30 mg by mouth daily.    [provider]  LANTUS  SOLOSTAR 100 UNIT/ML Solostar Pen Inject 38 Units into the skin at bedtime. 05/10/20   [provider]  midodrine (PROAMATINE) 10 MG tablet Take 10 mg by mouth 3 (three) times daily as needed (hypotension). 06/21/21   [provider]  Multiple Vitamins-Minerals (RENAPLEX-D) TABS Take 1 tablet by mouth every evening.  [provider]  ondansetron  (ZOFRAN ) 4 MG tablet Take 1 tablet (4 mg total) by mouth every 6 (six) hours. 10/26/16   Ballard Bongo, MD  ondansetron  (ZOFRAN -ODT) 4 MG disintegrating tablet Take 8 mg by mouth 2 (two) times daily. 11/13/23 02/13/24  [provider]  OZEMPIC, 0.25 OR 0.5 MG/DOSE, 2 MG/3ML SOPN Inject 0.25 mg into the skin once a week.  12/19/23  [provider]  pantoprazole  (PROTONIX ) 40 MG tablet Take 1 tablet (40 mg total) by mouth daily. 03/04/14   Lonita Roach, MD  pravastatin (PRAVACHOL) 20 MG tablet Take 20 mg by mouth every morning.     [provider]  pregabalin  (LYRICA ) 75 MG capsule Take 75 mg by  mouth daily.     [provider]  promethazine  (PHENERGAN ) 25 MG suppository Place 1 suppository (25 mg total) rectally every 6 (six) hours as needed for nausea. 11/20/11 05/08/12  Kennieth Peaches, MD  QUEtiapine  Fumarate (SEROQUEL  XR) 150 MG 24 hr tablet Take 150 mg by mouth at bedtime.      [provider]  sevelamer  (RENVELA ) 800 MG tablet Take 800 mg by mouth 3 (three) times daily with meals.      [provider]  sucroferric oxyhydroxide (VELPHORO) 500 MG chewable tablet Chew 500 mg by mouth 3 (three) times daily with meals.    [provider]  traMADol  (ULTRAM ) 50 MG tablet Take 50 mg by mouth daily as needed. For back  pain     [provider]  traZODone (DESYREL) 150 MG tablet Take 150 mg by mouth daily.    [provider]    Allergies: Ace inhibitors, Hydrocodone, Hydrocodone-acetaminophen, Hydromorphone  hcl, Hydromorphone  hcl, Iodine, Tylenol [acetaminophen], Zolpidem, Alprazolam, Zolpidem tartrate, Bee venom, and Povidone-iodine    Review of Systems  Constitutional:  Negative for chills, fatigue and fever.  Respiratory:  Negative for shortness of breath.   Neurological:  Positive for weakness.  Psychiatric/Behavioral:  Positive for confusion.   All other systems reviewed and are negative.   Updated Vital Signs BP (!) 92/59   Pulse 81   Temp 99.8 F (37.7 C) (Rectal)   Resp 10   SpO2 100%   Physical Exam Vitals and nursing note reviewed.  Constitutional:      General: She is not in acute distress.    Appearance: Normal appearance. She is not ill-appearing.  HENT:     Head: Normocephalic and atraumatic.     Nose: Nose normal.   Eyes:     Conjunctiva/sclera: Conjunctivae normal.    Cardiovascular:     Rate and Rhythm: Normal rate.  Pulmonary:     Effort: Pulmonary effort is normal. No respiratory distress.  Abdominal:     General: There is no distension.     Palpations: Abdomen is soft.     Tenderness: There is  no abdominal tenderness. There is no guarding.   Musculoskeletal:        General: No deformity.   Skin:    Findings: No rash.   Neurological:     Mental Status: She is alert.     (all labs ordered are listed, but only abnormal results are displayed) Labs Reviewed  COMPREHENSIVE METABOLIC PANEL WITH GFR - Abnormal; Notable for the following components:      Result Value   Glucose, Bld 152 (*)    BUN 34 (*)    Creatinine, Ser 6.60 (*)    Calcium  7.0 (*)    Albumin 3.0 (*)  GFR, Estimated 7 (*)    All other components within normal limits  CBC WITH DIFFERENTIAL/PLATELET - Abnormal; Notable for the following components:   RBC 2.57 (*)    Hemoglobin 8.6 (*)    HCT 26.2 (*)    MCV 101.9 (*)    Platelets 144 (*)    All other components within normal limits  BLOOD GAS, VENOUS - Abnormal; Notable for the following components:   pCO2, Ven 64 (*)    Bicarbonate 29.6 (*)    All other components within normal limits  BRAIN NATRIURETIC PEPTIDE - Abnormal; Notable for the following components:   B Natriuretic Peptide 205.0 (*)    All other components within normal limits  CULTURE, BLOOD (ROUTINE X 2)  CULTURE, BLOOD (ROUTINE X 2)  RESP PANEL BY RT-PCR (RSV, FLU A&B, COVID)  RVPGX2  LACTIC ACID, PLASMA  LACTIC ACID, PLASMA  PROTIME-INR  MAGNESIUM   URINALYSIS, W/ REFLEX TO CULTURE (INFECTION SUSPECTED)    EKG: None  Radiology: [facility] Chest Port 1 View Result Date: 11/30/2023 CLINICAL DATA:  Possible sepsis. Recent dialysis site procedure 11/28/2023. EXAM: PORTABLE CHEST 1 VIEW COMPARISON:  03/01/2014 FINDINGS: Lordotic technique is demonstrated. Right IJ Port-A-Cath with tip over the SVC. Lungs are adequately inflated without focal airspace consolidation or effusion. Possible minimal prominence of the perihilar vessels suggesting mild vascular congestion versus secondary to the degree of hypoinflation. Cardiomediastinal silhouette is unremarkable. Vascular stent over the left  axillary region. Remainder of the exam is unchanged. IMPRESSION: 1. No focal airspace consolidation. 2. Possible minimal prominence of the perihilar vessels suggesting mild vascular congestion versus secondary to the degree of hypoinflation. Electronically Signed   By: Roda Cirri M.D.   On: 11/30/2023 16:10   CT Head Wo Contrast Result Date: 11/30/2023 CLINICAL DATA:  Altered mental status.  Weakness. EXAM: CT HEAD WITHOUT CONTRAST TECHNIQUE: Contiguous axial images were obtained from the base of the skull through the vertex without intravenous contrast. RADIATION DOSE REDUCTION: This exam was performed according to the departmental dose-optimization program which includes automated exposure control, adjustment of the mA and/or kV according to patient size and/or use of iterative reconstruction technique. COMPARISON:  03/01/2014 FINDINGS: Brain: No evidence of intracranial hemorrhage, acute infarction, hydrocephalus, extra-axial collection, or mass lesion/mass effect. Mild diffuse cerebellar atrophy noted. Vascular:  No hyperdense vessel or other acute findings. Skull: No evidence of fracture or other significant bone abnormality. Sinuses/Orbits:  No acute findings. Other: None. IMPRESSION: No acute intracranial abnormality. Mild cerebellar atrophy. Electronically Signed   By: Marlyce Sine M.D.   On: 11/30/2023 16:09     .Critical Care  Performed by: Lucina Sabal, PA-C Authorized by: Lucina Sabal, PA-C   Critical care provider statement:    Critical care time (minutes):  50   Critical care was necessary to treat or prevent imminent or life-threatening deterioration of the following conditions:  Sepsis   Critical care was time spent personally by me on the following activities:  Development of treatment plan with patient or surrogate, discussions with consultants, evaluation of patient's response to treatment, examination of patient, ordering and review of laboratory studies, ordering and review of  radiographic studies, ordering and performing treatments and interventions, pulse oximetry, re-evaluation of patient's condition and review of old charts    Medications Ordered in the ED  vancomycin  (VANCOREADY) IVPB 2000 mg/400 mL (2,000 mg Intravenous New Bag/Given 11/30/23 1631)  sodium chloride  0.9 % bolus 500 mL (has no administration in time range)  ceFEPIme  (MAXIPIME ) 2 g in  sodium chloride  0.9 % 100 mL IVPB (0 g Intravenous Stopped 11/30/23 1632)  metroNIDAZOLE  (FLAGYL ) IVPB 500 mg (0 mg Intravenous Stopped 11/30/23 1641)  ondansetron  (ZOFRAN ) injection 4 mg (4 mg Intravenous Given 11/30/23 1641)    Clinical Course as of 11/30/23 1856  Sat Nov 30, 2023  1810 Patient remains with soft blood pressure.  Remains somnolent. [AA]  1810 Will give 500 mL fluid bolus [AA]  1810 Discussed with intensivist and nephrology. [AA]  [id number] Discussed with nephrology.  They are okay with one-time bolus.  They will consult on patient. [AA]  1831 Creatinine(!): 6.60 [AA]    Clinical Course User Index [AA] Lucina Sabal, PA-C                                 Medical Decision Making Amount and/or Complexity of Data Reviewed Labs: ordered. Decision-making details documented in ED Course. Radiology: ordered.  Risk Prescription drug management.   Medical Decision Making / ED Course   This patient presents to the ED for concern of altered mental status, this involves an extensive number of treatment options, and is a complaint that carries with it a high risk of complications and morbidity.  The differential diagnosis includes infection such as pneumonia, skin infection, hypotension, metabolic encephalopathy, uremia  MDM: 63 year old female presents today for concern of altered mental status, weakness.  This started around 1:30 PM.  Her recent admission at [facility]. She is oriented but very slow to respond and weak during my interview.  CT head without acute intracranial process.  Chest  x-ray shows some vascular congestion otherwise no pneumonia or other acute cardiopulmonary process.  CBC shows hemoglobin 8.6 seems to be dilution.  Baseline around midnight.  CMP with creatinine of 6.6 which is around her baseline.  Magnesium  2.1, BNP 205.  VBG without acute process.  Lactic acid normal x 2. Due to persistent hypotension patient was given 500 mL fluid bolus.  Later patient was able to provide some additional history regarding her procedure.  She states her fistula flow rate was low so they went into clean it.  Discussed with intensivist.  He feels patient does not require ICU level of care at this time.  Discussed with hospitalist will evaluate patient for admission.    Lab Tests: -I ordered, reviewed, and interpreted labs.   The pertinent results include:   Labs Reviewed  COMPREHENSIVE METABOLIC PANEL WITH GFR - Abnormal; Notable for the following components:      Result Value   Glucose, Bld 152 (*)    BUN 34 (*)    Creatinine, Ser 6.60 (*)    Calcium  7.0 (*)    Albumin 3.0 (*)    GFR, Estimated 7 (*)    All other components within normal limits  CBC WITH DIFFERENTIAL/PLATELET - Abnormal; Notable for the following components:   RBC 2.57 (*)    Hemoglobin 8.6 (*)    HCT 26.2 (*)    MCV 101.9 (*)    Platelets 144 (*)    All other components within normal limits  BLOOD GAS, VENOUS - Abnormal; Notable for the following components:   pCO2, Ven 64 (*)    Bicarbonate 29.6 (*)    All other components within normal limits  BRAIN NATRIURETIC PEPTIDE - Abnormal; Notable for the following components:   B Natriuretic Peptide 205.0 (*)    All other components within normal limits  CULTURE, BLOOD (ROUTINE X  2)  CULTURE, BLOOD (ROUTINE X 2)  RESP PANEL BY RT-PCR (RSV, FLU A&B, COVID)  RVPGX2  LACTIC ACID, PLASMA  LACTIC ACID, PLASMA  PROTIME-INR  MAGNESIUM   URINALYSIS, W/ REFLEX TO CULTURE (INFECTION SUSPECTED)      EKG  EKG Interpretation Date/Time:     Ventricular Rate:    PR Interval:    QRS Duration:    QT Interval:    QTC Calculation:   R Axis:      Text Interpretation:           Imaging Studies ordered: I ordered imaging studies including CT head, chest x-ray I independently visualized and interpreted imaging. I agree with the radiologist interpretation   Medicines ordered and prescription drug management: Meds ordered this encounter  Medications   ceFEPIme  (MAXIPIME ) 2 g in sodium chloride  0.9 % 100 mL IVPB    Antibiotic Indication::   Other Indication (list below)    Other Indication::   Unknown Source.   metroNIDAZOLE  (FLAGYL ) IVPB 500 mg    Antibiotic Indication::   Other Indication (list below)    Other Indication::   Unknown Source.   DISCONTD: vancomycin  (VANCOCIN ) IVPB 1000 mg/200 mL premix    Indication::   Other Indication (list below)    Other Indication::   Unknown Source.   vancomycin  (VANCOREADY) IVPB 2000 mg/400 mL    Indication::   Sepsis    Other Indication::   Unknown Source.   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  Final diagnoses:  Sepsis, due to unspecified organism, unspecified whether acute organ dysfunction present (HCC)  ESRD (end stage renal disease) (HCC)  Hypotension, unspecified hypotension type    ED Discharge Orders     None           Lucina Sabal, PA-C 11/30/23 1900    Early Glisson, MD 12/01/23 1431

## 2023-11-30 NOTE — Progress Notes (Signed)
 Progress Note    BP (!) 71/23   Pulse 81   Temp 98.3 F (36.8 C) (Oral)   Resp 15   Ht 5' 2 (1.575 m)   Wt 98.1 kg   SpO2 100%   BMI 39.56 kg/m

## 2023-11-30 NOTE — ED Notes (Signed)
 Pts CBG was 151

## 2023-11-30 NOTE — H&P (Addendum)
 History and Physical    Kayla Gibson UJW:[id number] DOB: December 04, 1960 DOA: 11/30/2023  Referring MD/NP/PA: EDP PCP:  Patient coming from: Home  Chief Complaint: Lethargy and weakness  HPI: Kayla Gibson is a 62/F with history of ESRD on hemodialysis, chronic hypotension on midodrine, type 2 diabetes mellitus, peripheral vascular disease, BKA, hypertension, gastroparesis, depression, was admitted to [facility] on 6/12 on account of AV fistula flow issues, underwent fistulogram on Thursday 6/12, subsequently observed the following day on account of low blood pressures, discharged yesterday (6/13 )morning, after this family noticed that she was extremely tired sleepy and weak, they took her to [facility] in [place]  yesterday evening, she they gave her some fluids and discharged her after midnight very early this morning.  After going home patient's spouse noticed that she was lethargic, slumped down, not her usual self and not communicating, subsequently brought her to [facility]. ED Course: Temp was 99.8, initially hypoxic, became hypotensive in the ER with maps as low as 50, slowly improving, starting on a fluid bolus and antibiotics now.  EDP called critical care team at [facility] who recommended we try to stabilize her and admit her to stepdown at [facility], labs noted glucose 152, creatinine 6.6, BUN 34, WBC 8.3, hemoglobin 8.6, lactic acid 1.6, 1.4, CT head with no acute findings, chest x-ray with?  Mild pulmonary vascular congestion  Review of Systems: As per HPI otherwise 14 point review of systems negative.   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 She has never used smokeless tobacco. She reports that she does not drink alcohol and does not use drugs.  Allergies  Allergen Reactions   Ace Inhibitors Shortness Of Breath   Hydrocodone Anaphylaxis and Other (See Comments)    Stopped me from breathing   Hydrocodone-Acetaminophen Anaphylaxis   Hydromorphone  Hcl Anaphylaxis, Shortness Of Breath and Other (See Comments)    Stopped me from breathing Hallucinations   Hydromorphone  Hcl Anaphylaxis    Tolerated oxycodone  x 2 doses previously   Iodine Anaphylaxis   Tylenol [Acetaminophen] Anaphylaxis   Zolpidem Anaphylaxis and Other (See Comments)    Hallucinations   Alprazolam Other (See Comments)    Hallucination   Zolpidem Tartrate Other (See Comments)    Hallucinations   Bee Venom Swelling   Povidone-Iodine Hives and Itching    04/19/2021 pt is not allergic to IV contrast    Family History  Problem Relation Age of Onset   Cancer Mother 27       stomach   Diabetes Mother    Stroke Father 72   Heart failure Father    Diabetes Sister    Hypertension Sister      Prior to Admission medications   Medication Sig Start Date End Date Taking? Authorizing Provider  aspirin  EC 81 MG tablet Take 81 mg by  mouth every morning.    Yes [provider]  atorvastatin (LIPITOR) 40 MG tablet Take 40 mg by mouth daily. 05/10/20  Yes [provider]  calcitRIOL (ROCALTROL) 0.5 MCG capsule Take 0.5 mcg by mouth 2 (two) times daily.   Yes [provider]  calcium  acetate (PHOSLO ) 667 MG capsule Take 667 mg by mouth 3 (three) times daily.     Yes [provider]  cinacalcet  (SENSIPAR ) 30 MG tablet Take 30 mg by mouth every evening.     Yes [provider]  CYMBALTA 20 MG  capsule Take 20 mg by mouth daily. 05/28/23  Yes [provider]  dorzolamide-timolol (COSOPT) 2-0.5 % ophthalmic solution Place 1 drop into the right eye 2 (two) times daily. 11/26/22  Yes [provider]  famotidine  (PEPCID ) 20 MG tablet Take 20 mg by mouth 2 (two) times daily.   Yes [provider]  fenofibrate  (TRICOR ) 48 MG tablet Take 48 mg by mouth daily.   Yes [provider]  gabapentin (NEURONTIN) 300 MG capsule Take 300 mg by mouth 2 (two) times daily.   Yes [provider]  HUMALOG KWIKPEN 100 UNIT/ML KwikPen Inject 18 Units into the skin 3 (three) times daily.   Yes [provider]  isosorbide  mononitrate (IMDUR ) 30 MG 24 hr tablet Take 30 mg by mouth daily.   Yes [provider]  LANTUS  SOLOSTAR 100 UNIT/ML Solostar Pen Inject 38 Units into the skin at bedtime. 05/10/20  Yes [provider]  midodrine (PROAMATINE) 10 MG tablet Take 10 mg by mouth 3 (three) times daily as needed (hypotension). 06/21/21  Yes [provider]  Multiple Vitamins-Minerals (RENAPLEX-D) TABS Take 1 tablet by mouth every evening.   Yes [provider]  ondansetron  (ZOFRAN -ODT) 4 MG disintegrating tablet Take 8 mg by mouth every 8 (eight) hours as needed for nausea or vomiting. 11/13/23 02/13/24 Yes [provider]  OZEMPIC, 0.25 OR 0.5 MG/DOSE, 2 MG/3ML SOPN Inject 0.25 mg into the skin every Thursday.  12/19/23 Yes [provider]  sucroferric oxyhydroxide (VELPHORO) 500 MG chewable tablet Chew 500 mg by mouth 3 (three) times daily with meals.   Yes [provider]  traMADol  (ULTRAM ) 50 MG tablet Take 50 mg by mouth daily as needed. For back  pain    Yes [provider]  traZODone (DESYREL) 150 MG tablet Take 150 mg by mouth daily.   Yes [provider]    Physical Exam: Vitals:   11/30/23 1600 11/30/23 1700 11/30/23 1800 11/30/23 1905  BP:  (!) 92/59 103/60 (!) 101/57  Pulse:  81  83 84  Resp:  10 12 13   Temp: 99.8 F (37.7 C)     TempSrc: Rectal     SpO2:  100% 99% 97%      Constitutional: Obese chronically ill female laying in bed, somnolent easily arousable, oriented to self and place only, falls back to sleep  Neck HEENT: Neck obese unable to assess JVD, surgical scar in right upper chest with Port-A-Cath which appears distant and deep CVS: S1-S2, regular rhythm, tachycardic Lungs: Distant breath sounds Abdomen: Soft, obese, nontender, bowel sounds present Extremities: Left arm AV fistula with multiple scars, extensive staples, mild tenderness, no erythema or warmth appreciated Right BKA, left foot transmetatarsal amputation Sacrum, small superficial wound in the right gluteal area, healed with scab and dry skin, no signs  of infection  Labs on Admission: I have personally reviewed following labs and imaging studies  CBC: Recent Labs  Lab 11/30/23 1514  WBC 8.3  NEUTROABS 6.3  HGB 8.6*  HCT 26.2*  MCV 101.9*  PLT 144*   Basic Metabolic Panel: Recent Labs  Lab 11/30/23 1514  NA 137  K 4.2  CL 98  CO2 24  GLUCOSE 152*  BUN 34*  CREATININE 6.60*  CALCIUM  7.0*  MG 2.1   GFR: CrCl cannot be calculated (Unknown ideal weight.). Liver Function Tests: Recent Labs  Lab 11/30/23 1514  AST 26  ALT 7  ALKPHOS 96  BILITOT 0.7  PROT 7.4  ALBUMIN 3.0*   No results for input(s): LIPASE, AMYLASE in the last 168 hours. No results for input(s): AMMONIA in the last 168 hours. Coagulation Profile: Recent Labs  Lab 11/30/23 1514  INR 1.1   Cardiac Enzymes: No results for input(s): CKTOTAL, CKMB, CKMBINDEX, TROPONINI in the last 168 hours. BNP (last 3 results) No results for input(s): PROBNP in the last 8760 hours. HbA1C: No results for input(s): HGBA1C in the last 72 hours. CBG: No results for input(s): GLUCAP in the last 168 hours. Lipid Profile: No results for input(s): CHOL, HDL, LDLCALC, TRIG, CHOLHDL,  LDLDIRECT in the last 72 hours. Thyroid Function Tests: No results for input(s): TSH, T4TOTAL, FREET4, T3FREE, THYROIDAB in the last 72 hours. Anemia Panel: No results for input(s): VITAMINB12, FOLATE, FERRITIN, TIBC, IRON, RETICCTPCT in the last 72 hours. Urine analysis: No results found for: COLORURINE, APPEARANCEUR, LABSPEC, PHURINE, GLUCOSEU, HGBUR, BILIRUBINUR, KETONESUR, PROTEINUR, UROBILINOGEN, NITRITE, LEUKOCYTESUR Sepsis Labs: @LABRCNTIP (procalcitonin:4,lacticidven:4) ) Recent Results (from the past 240 hours)  Blood Culture (routine x 2)     Status: None (Preliminary result)   Collection Time: 11/30/23  3:14 PM   Specimen: Vein; Blood  Result Value Ref Range Status   Specimen Description   Final    RIGHT ANTECUBITAL BOTTLES DRAWN AEROBIC AND ANAEROBIC   Special Requests   Final    Blood Culture adequate volume Performed at [facility], [address] [place], [place] [address]    Culture PENDING  Incomplete   Report Status PENDING  Incomplete  Blood Culture (routine x 2)     Status: None (Preliminary result)   Collection Time: 11/30/23  3:14 PM   Specimen: Peripheral; Blood  Result Value Ref Range Status   Specimen Description   Final    BLOOD RIGHT HAND BOTTLES DRAWN AEROBIC AND ANAEROBIC   Special Requests   Final    Blood Culture adequate volume Performed at [facility], [address], [place], [place] [address]    Culture PENDING  Incomplete   Report Status PENDING  Incomplete     Radiological Exams on Admission: DG Chest Port 1 View Result Date: 11/30/2023 CLINICAL DATA:  Possible sepsis. Recent dialysis site procedure 11/28/2023. EXAM: PORTABLE CHEST 1 VIEW COMPARISON:  03/01/2014 FINDINGS: Lordotic technique is demonstrated. Right IJ Port-A-Cath with tip over the SVC. Lungs are adequately inflated without focal airspace consolidation or effusion. Possible minimal prominence of the perihilar vessels suggesting  mild vascular congestion versus secondary to the degree of hypoinflation. Cardiomediastinal silhouette is unremarkable. Vascular stent over the left axillary region. Remainder of the exam is unchanged. IMPRESSION: 1. No focal airspace consolidation. 2. Possible minimal prominence of the perihilar vessels suggesting mild vascular congestion versus secondary to the degree of hypoinflation. Electronically Signed   By: Roda Cirri M.D.   On: 11/30/2023 16:10   CT Head Wo Contrast Result Date: 11/30/2023 CLINICAL DATA:  Altered mental status.  Weakness. EXAM: CT HEAD WITHOUT  CONTRAST TECHNIQUE: Contiguous axial images were obtained from the base of the skull through the vertex without intravenous contrast. RADIATION DOSE REDUCTION: This exam was performed according to the departmental dose-optimization program which includes automated exposure control, adjustment of the mA and/or kV according to patient size and/or use of iterative reconstruction technique. COMPARISON:  03/01/2014 FINDINGS: Brain: No evidence of intracranial hemorrhage, acute infarction, hydrocephalus, extra-axial collection, or mass lesion/mass effect. Mild diffuse cerebellar atrophy noted. Vascular:  No hyperdense vessel or other acute findings. Skull: No evidence of fracture or other significant bone abnormality. Sinuses/Orbits:  No acute findings. Other: None. IMPRESSION: No acute intracranial abnormality. Mild cerebellar atrophy. Electronically Signed   By: Marlyce Sine M.D.   On: 11/30/2023 16:09    EKG: Tachycardia, no acute ST-T wave changes Assessment/Plan Principal Problem:    Sepsis (HCC), poa - lactate reassuring, No clear source at this time, - Potentially recent instrumentation/ fistulogram could have contributed to bacteremia - Also check COVID/flu/RSV - Follow-up blood cultures drawn in the ER, start IV vancomycin , cefepime  - Additional fluid bolus at this time to maintain maps greater than 60 - Nephrology consulted will  evaluate her tomorrow, could comment on her AVF site - Midodrine resumed  Toxic encephalopathy - Secondary to sepsis - Hold gabapentin and trazodone at this time    ESRD (end stage renal disease) (HCC) -Last HD yesterday on Friday -Nephrology consulted will see in a.m.  Anemia of chronic disease - Trend    Type 2 diabetes mellitus  -Resume insulin  at lower dose, SSI    H/o Gastroparesis -Monitor    Hx of BKA (HCC) Transmetatarsal amputation   Obesity   DVT prophylaxis: hep SQ Code Status: Full Code Family Communication: Daughter and husband at bedside Disposition Plan: Inpatient, admit to stepdown unit Consults called: Nephrology called by EDP   Deforest Fast MD [facility]   11/30/2023, 7:32 PM

## 2023-11-30 NOTE — Progress Notes (Signed)
 Pharmacy Antibiotic Note  Kayla Gibson is a 63 y.o. female admitted on 11/30/2023 with sepsis. PMH significant for ESRD on HD MWF, HTN, and T2DM. Patient presenting with altered mental status and hypotension. In ED, patient is afebrile with WBC 8.3. Pharmacy has been consulted for cefepime  and vancomycin  dosing.  Plan: Start cefepime  1 g IV every 24 hours Vancomycin  load of 2000 mg IV x1 given in ED Start vancomycin  1000 mg IV every MWF with HD Monitor clinical status, culture data, and LOT F/u nephro plans for HD while admitted  Temp (24hrs), Avg:99.7 F (37.6 C), Min:99.5 F (37.5 C), Max:99.8 F (37.7 C)  Recent Labs  Lab 11/30/23 1514 11/30/23 1655  WBC 8.3  --   CREATININE 6.60*  --   LATICACIDVEN 1.6 1.4    CrCl cannot be calculated (Unknown ideal weight.).    Allergies  Allergen Reactions   Ace Inhibitors Shortness Of Breath   Hydrocodone Anaphylaxis and Other (See Comments)    Stopped me from breathing   Hydrocodone-Acetaminophen Anaphylaxis   Hydromorphone  Hcl Anaphylaxis, Shortness Of Breath and Other (See Comments)    Stopped me from breathing Hallucinations   Hydromorphone  Hcl Anaphylaxis    Tolerated oxycodone  x 2 doses previously   Iodine Anaphylaxis   Tylenol [Acetaminophen] Anaphylaxis   Zolpidem Anaphylaxis and Other (See Comments)    Hallucinations   Alprazolam Other (See Comments)    Hallucination   Zolpidem Tartrate Other (See Comments)    Hallucinations   Bee Venom Swelling   Povidone-Iodine Hives and Itching    04/19/2021 pt is not allergic to IV contrast    Antimicrobials this admission: cefepime  6/14 >>  vancomycin  6/14 >>   Dose adjustments this admission: N/A  Microbiology results: 6/14 BCx: pending  Thank you for involving pharmacy in this patient's care.   Ananias Balls, PharmD Clinical Pharmacist 11/30/2023 7:35 PM

## 2023-11-30 NOTE — ED Notes (Signed)
 Notified Dr. Drexel Gentles that patient's BP is trending lower 88/53 now. PRN midodrine given orally. 1 L NS bolus infusing. No orders at this time.

## 2023-12-01 ENCOUNTER — Other Ambulatory Visit: Payer: Self-pay

## 2023-12-01 DIAGNOSIS — J9611 Chronic respiratory failure with hypoxia: Secondary | ICD-10-CM | POA: Diagnosis not present

## 2023-12-01 DIAGNOSIS — A419 Sepsis, unspecified organism: Secondary | ICD-10-CM | POA: Diagnosis not present

## 2023-12-01 DIAGNOSIS — N186 End stage renal disease: Secondary | ICD-10-CM | POA: Diagnosis not present

## 2023-12-01 LAB — COMPREHENSIVE METABOLIC PANEL WITH GFR
ALT: 6 U/L (ref 0–44)
AST: 34 U/L (ref 15–41)
Albumin: 2.9 g/dL — ABNORMAL LOW (ref 3.5–5.0)
Alkaline Phosphatase: 87 U/L (ref 38–126)
Anion gap: 15 (ref 5–15)
BUN: 36 mg/dL — ABNORMAL HIGH (ref 6–20)
CO2: 19 mmol/L — ABNORMAL LOW (ref 22–32)
Calcium: 6.4 mg/dL — CL (ref 8.9–10.3)
Chloride: 101 mmol/L (ref 98–111)
Creatinine, Ser: 6.52 mg/dL — ABNORMAL HIGH (ref 0.44–1.00)
GFR, Estimated: 7 mL/min — ABNORMAL LOW (ref >60)
Glucose, Bld: 148 mg/dL — ABNORMAL HIGH (ref 70–99)
Potassium: 3.8 mmol/L (ref 3.5–5.1)
Sodium: 135 mmol/L (ref 135–145)
Total Bilirubin: 0.9 mg/dL (ref 0.0–1.2)
Total Protein: 6.7 g/dL (ref 6.5–8.1)

## 2023-12-01 LAB — CBC
HCT: 29.6 % — ABNORMAL LOW (ref 36.0–46.0)
Hemoglobin: 9.4 g/dL — ABNORMAL LOW (ref 12.0–15.0)
MCH: 32.6 pg (ref 26.0–34.0)
MCHC: 31.8 g/dL (ref 30.0–36.0)
MCV: 102.8 fL — ABNORMAL HIGH (ref 80.0–100.0)
Platelets: 145 10*3/uL — ABNORMAL LOW (ref 150–400)
RBC: 2.88 MIL/uL — ABNORMAL LOW (ref 3.87–5.11)
RDW: 14.5 % (ref 11.5–15.5)
WBC: 9.6 10*3/uL (ref 4.0–10.5)
nRBC: 0 % (ref 0.0–0.2)

## 2023-12-01 LAB — GLUCOSE, CAPILLARY
Glucose-Capillary: 134 mg/dL — ABNORMAL HIGH (ref 70–99)
Glucose-Capillary: 165 mg/dL — ABNORMAL HIGH (ref 70–99)

## 2023-12-01 LAB — HIV ANTIBODY (ROUTINE TESTING W REFLEX): HIV Screen 4th Generation wRfx: NONREACTIVE

## 2023-12-01 MED ORDER — PROCHLORPERAZINE EDISYLATE 10 MG/2ML IJ SOLN
10.0000 mg | Freq: Four times a day (QID) | INTRAMUSCULAR | Status: DC | PRN
  Administered 2023-12-01: 10 mg via INTRAVENOUS
  Filled 2023-12-01: qty 2

## 2023-12-01 MED ORDER — CHLORHEXIDINE GLUCONATE CLOTH 2 % EX PADS
6.0000 | MEDICATED_PAD | Freq: Every day | CUTANEOUS | Status: DC
  Administered 2023-12-02 – 2023-12-05 (×4): 6 via TOPICAL

## 2023-12-01 MED ORDER — MIDODRINE HCL 5 MG PO TABS
10.0000 mg | ORAL_TABLET | Freq: Three times a day (TID) | ORAL | Status: DC
  Administered 2023-12-01 – 2023-12-05 (×13): 10 mg via ORAL
  Filled 2023-12-01 (×13): qty 2

## 2023-12-01 MED ORDER — SODIUM CHLORIDE 0.9 % IV SOLN
2.0000 g | INTRAVENOUS | Status: DC
  Administered 2023-12-02: 2 g via INTRAVENOUS
  Filled 2023-12-01: qty 12.5

## 2023-12-01 MED ORDER — FAMOTIDINE 20 MG PO TABS
20.0000 mg | ORAL_TABLET | Freq: Every day | ORAL | Status: DC
  Administered 2023-12-02 – 2023-12-04 (×3): 20 mg via ORAL
  Filled 2023-12-01 (×3): qty 1

## 2023-12-01 MED ORDER — MIDODRINE HCL 5 MG PO TABS: 5.0000 mg | ORAL_TABLET | Freq: Three times a day (TID) | ORAL | Status: DC

## 2023-12-01 NOTE — Hospital Course (Addendum)
 Kayla Gibson was admitted to the hospital with the working diagnosis of sepsis due to left lower lobe pneumonia.   63 year old female with a history of ESRD (MWF) chronic hypotension on midodrine  on dialysis days, diabetes mellitus type 2, peripheral vascular disease status post right BKA, hypertension, gastroparesis, depression presented with altered mental status.  The patient went to [facility] on 11/28/2023 for difficulties with her AV graft.  Apparently the patient underwent fistulogram and some other procedure.  Patient was kept overnight secondary to some low blood pressures.  She was dialyzed on 11/29/2023 through the fistula.   She was subsequently discharged home.   11/30/2023 around 1 AM, the patient appeared lethargic.  She was taken to [facility] in [place].  She was noted to have low blood pressures in the ED.  She was given IV fluids in the ED.  Mental status improved and patient was discharged home.  Later in the evening on 11/30/2023, the patient was once again slow to respond.  As result, the patient was brought to [facility] for further evaluation.  On her initial physical examination her temp was 99.8, HR 84 RR 10 and 02 saturation 97% on supplemental 02 per [place] but hypoxic on room air, lungs had decreased breath sounds, heart with S1 and S2 present and regular, abdomen with no distention and no lower extremity edema. Right BKA.  Scrum with small superficial wound in the right gluteal area. Healed with scap and dry skin, no signs of infection.   Became hypotensive in the ER with MAPs as low as 50.  Chest radiograph with hypoinflation, no cardiomegaly, increased lung marking bilaterally.   EKG 87 bpm, normal axis, qtc 519, sinus rhythm with left atrial enlargement and 1st degree AV block, no significant ST segment or T wave changes. Low voltage.   CT chest with trace left pleural effusion with left lower lobe consolidation. Left breast dermal thickening and soft tissue edema.  Prominent left breast glandular tissue underlying mass not excluded. A 3,5 x 1,9 cm fluid density lesion within the left breast with associated adjacent biopsy markers.   Patient required vasopressors for blood pressure support.   06/17 off vasopressors. 06/18 for HD  06/19 patient has been hemodynamically stable, no fever and no dyspnea, tolerated HD well.

## 2023-12-01 NOTE — Consult Note (Signed)
 [facility]  INPATIENT CONSULTATION  Reason for Consultation: ESRD Requesting Provider: Dr Winferd Hatter  HPI: Kayla Gibson is an 63 y.o. female with ESRD on HD, hypotension, DM type 2, PVD, h/o BKA, HTN, gastroparesis currently admitted for sepsis and nephrology is consulted for co management of ESRD and assoc conditions.   Admitted [facility] 6/12 with AV access issues had fistulogram and discharged 6/13 after an overnight obs due to low BP.  She returned home and remained somnolent so went to [facility] ED 6/13 where she rec'd fluids and was discharged.  Problems continued so family brought her to [facility].   She was noted to have T 99.8, hypotensive with MAP as low as 50, hypoxic requiring 2>4L [place].  She rec'd small fluid bolus and IV abx and was admitted to ICU.  CT head ok, CXR with mild congestion. WBC 8.3, Hb 8.6, plt 144. Blood cultures pending.  Has been on low dose NE gtt.  She is on 4L [place] currently.    Husband bedside.  She is improved clinically - mental status much improved. She reports no localizing s/s infection.     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   Osteomyelitis (HCC) 2009   PSH: Past Surgical History:  Procedure Laterality Date   AV FISTULA PLACEMENT     left arm   BACK SURGERY     CHOLECYSTECTOMY     COLONOSCOPY  11/06/2011   Procedure: COLONOSCOPY;  Surgeon: Ruby Corporal, MD;  Location: [facility];  Service: Endoscopy;  Laterality: N/A;   PACEMAKER INSERTION     for gastroparesis    Past Medical History:  Diagnosis Date   Depression    Diabetes mellitus    ESRD on hemodialysis (HCC)    Gastroparesis    Hypertension    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  Medications Prior to Admission  Medication  Sig Dispense Refill   aspirin  EC 81 MG tablet Take 81 mg by mouth every morning.      atorvastatin (LIPITOR) 40 MG tablet Take 40 mg by mouth daily.     calcitRIOL (ROCALTROL) 0.5 MCG capsule Take 0.5 mcg by mouth 2 (two) times daily.     calcium  acetate (PHOSLO ) 667 MG capsule Take 667 mg by mouth 3 (three) times daily.       cinacalcet  (SENSIPAR ) 30 MG tablet Take 30 mg by mouth every evening.       CYMBALTA 20 MG capsule Take 20 mg by mouth daily.     dorzolamide-timolol (COSOPT) 2-0.5 % ophthalmic solution Place 1 drop into the right eye 2 (two) times daily.     famotidine  (PEPCID ) 20 MG tablet Take 20 mg by mouth 2 (two) times daily.     fenofibrate  (TRICOR ) 48 MG tablet Take 48 mg by mouth daily.     gabapentin (NEURONTIN) 300 MG capsule Take 300 mg by mouth 2 (two) times daily.     HUMALOG KWIKPEN 100 UNIT/ML KwikPen Inject 18 Units into the skin 3 (three) times daily.     isosorbide  mononitrate (IMDUR ) 30 MG 24 hr tablet Take 30 mg by mouth daily.     LANTUS  SOLOSTAR 100 UNIT/ML Solostar Pen Inject 38 Units into the skin  at bedtime.     midodrine (PROAMATINE) 10 MG tablet Take 10 mg by mouth 3 (three) times daily as needed (hypotension).     Multiple Vitamins-Minerals (RENAPLEX-D) TABS Take 1 tablet by mouth every evening.     ondansetron  (ZOFRAN -ODT) 4 MG disintegrating tablet Take 8 mg by mouth every 8 (eight) hours as needed for nausea or vomiting.     OZEMPIC, 0.25 OR 0.5 MG/DOSE, 2 MG/3ML SOPN Inject 0.25 mg into the skin every Thursday.     sucroferric oxyhydroxide (VELPHORO) 500 MG chewable tablet Chew 500 mg by mouth 3 (three) times daily with meals.     traMADol  (ULTRAM ) 50 MG tablet Take 50 mg by mouth daily as needed. For back  pain      traZODone (DESYREL) 150 MG tablet Take 150 mg by mouth daily.      ALLERGIES:   Allergies  Allergen Reactions   Ace Inhibitors Shortness Of Breath   Hydrocodone Anaphylaxis and Other (See Comments)    Stopped me from breathing    Hydrocodone-Acetaminophen Anaphylaxis   Hydromorphone  Hcl Anaphylaxis, Shortness Of Breath and Other (See Comments)    Stopped me from breathing Hallucinations   Hydromorphone  Hcl Anaphylaxis    Tolerated oxycodone  x 2 doses previously   Iodine Anaphylaxis   Tylenol [Acetaminophen] Anaphylaxis   Zolpidem Anaphylaxis and Other (See Comments)    Hallucinations   Alprazolam Other (See Comments)    Hallucination   Zolpidem Tartrate Other (See Comments)    Hallucinations   Bee Venom Swelling   Povidone-Iodine Hives and Itching    04/19/2021 pt is not allergic to IV contrast    FAM HX: Family History  Problem Relation Age of Onset   Cancer Mother 24       stomach   Diabetes Mother    Stroke Father 58   Heart failure Father    Diabetes Sister    Hypertension Sister     Social History:   reports that she has never smoked. She has never used smokeless tobacco. She reports that she does not drink alcohol and does not use drugs.  ROS: 12 system ROS neg except per HPI above  Blood pressure (!) 123/43, pulse (!) 106, temperature 98.4 F (36.9 C), temperature source Oral, resp. rate 19, height 5' 2 (1.575 m), weight 98.1 kg, SpO2 100%. PHYSICAL EXAM: Gen: chronically ill appearing woman in no distress  Eyes: periorbital edema, mild facial fullness ENT: MMM Neck: thick, supple CV:  tachycardic regular Abd: soft, nontender Lungs: clear, normal WOB Extr:  R BKA, L TMT amputations, L forearm AVG with multiple incisions closed with staples - no drainage, erythema or fluctuance Neuro: AOX3 conversant   Results for orders placed or performed during the hospital encounter of 11/30/23 (from the past 48 hours)  Lactic acid, plasma     Status: None   Collection Time: 11/30/23  3:14 PM  Result Value Ref Range   Lactic Acid, Venous 1.6 0.5 - 1.9 mmol/L    Comment: Performed at [facility], [address] [place], [place] [address]  Comprehensive metabolic panel     Status: Abnormal    Collection Time: 11/30/23  3:14 PM  Result Value Ref Range   Sodium 137 135 - 145 mmol/L   Potassium 4.2 3.5 - 5.1 mmol/L   Chloride 98 98 - 111 mmol/L   CO2 24 22 - 32 mmol/L   Glucose, Bld 152 (H) 70 - 99 mg/dL    Comment: Glucose reference range applies  only to samples taken after fasting for at least 8 hours.   BUN 34 (H) 6 - 20 mg/dL   Creatinine, Ser 1.61 (H) 0.44 - 1.00 mg/dL   Calcium  7.0 (L) 8.9 - 10.3 mg/dL   Total Protein 7.4 6.5 - 8.1 g/dL   Albumin 3.0 (L) 3.5 - 5.0 g/dL   AST 26 15 - 41 U/L   ALT 7 0 - 44 U/L   Alkaline Phosphatase 96 38 - 126 U/L   Total Bilirubin 0.7 0.0 - 1.2 mg/dL   GFR, Estimated 7 (L) >60 mL/min    Comment: (NOTE) Calculated using the CKD-EPI Creatinine Equation (2021)    Anion gap 15 5 - 15    Comment: Performed at [facility], [address], [place], [place] [address]  CBC with Differential     Status: Abnormal   Collection Time: 11/30/23  3:14 PM  Result Value Ref Range   WBC 8.3 4.0 - 10.5 K/uL   RBC 2.57 (L) 3.87 - 5.11 MIL/uL   Hemoglobin 8.6 (L) 12.0 - 15.0 g/dL   HCT 54.0 (L) 98.1 - 19.1 %   MCV 101.9 (H) 80.0 - 100.0 fL   MCH 33.5 26.0 - 34.0 pg   MCHC 32.8 30.0 - 36.0 g/dL   RDW 47.8 29.5 - 62.1 %   Platelets 144 (L) 150 - 400 K/uL   nRBC 0.0 0.0 - 0.2 %   Neutrophils Relative % 77 %   Neutro Abs 6.3 1.7 - 7.7 K/uL   Lymphocytes Relative 16 %   Lymphs Abs 1.4 0.7 - 4.0 K/uL   Monocytes Relative 6 %   Monocytes Absolute 0.5 0.1 - 1.0 K/uL   Eosinophils Relative 1 %   Eosinophils Absolute 0.1 0.0 - 0.5 K/uL   Basophils Relative 0 %   Basophils Absolute 0.0 0.0 - 0.1 K/uL   Immature Granulocytes 0 %   Abs Immature Granulocytes 0.03 0.00 - 0.07 K/uL    Comment: Performed at [facility], [address] [place], [place] [address]  Protime-INR     Status: None   Collection Time: 11/30/23  3:14 PM  Result Value Ref Range   Prothrombin Time 14.1 11.4 - 15.2 seconds   INR 1.1 0.8 - 1.2    Comment: (NOTE) INR goal varies  based on device and disease states. Performed at [facility], [address] [place], [place] [address]   Blood Culture (routine x 2)     Status: None (Preliminary result)   Collection Time: 11/30/23  3:14 PM   Specimen: Vein; Blood  Result Value Ref Range   Specimen Description      RIGHT ANTECUBITAL BOTTLES DRAWN AEROBIC AND ANAEROBIC   Special Requests      Blood Culture adequate volume Performed at [facility], [address], [place], [place] [address]    Culture PENDING    Report Status PENDING   Blood Culture (routine x 2)     Status: None (Preliminary result)   Collection Time: 11/30/23  3:14 PM   Specimen: Peripheral; Blood  Result Value Ref Range   Specimen Description      BLOOD RIGHT HAND BOTTLES DRAWN AEROBIC AND ANAEROBIC   Special Requests      Blood Culture adequate volume Performed at [facility], [address], [place], [place] [address]    Culture PENDING    Report Status PENDING   Blood gas, venous ([facility], [facility], [facility])     Status: Abnormal   Collection Time:  11/30/23  3:14 PM  Result Value Ref Range   pH, Ven 7.27 7.25 - 7.43   pCO2, Ven 64 (H) 44 - 60 mmHg   pO2, Ven 34 32 - 45 mmHg   Bicarbonate 29.6 (H) 20.0 - 28.0 mmol/L   Acid-Base Excess 1.2 0.0 - 2.0 mmol/L   O2 Saturation 53 %   Patient temperature 37.5    Collection site RIGHT ANTECUBITAL    Drawn by [id number]     Comment: Performed at [facility], [address], [place], [place] [address]  Brain natriuretic peptide     Status: Abnormal   Collection Time: 11/30/23  3:14 PM  Result Value Ref Range   B Natriuretic Peptide 205.0 (H) 0.0 - 100.0 pg/mL    Comment: Performed at [facility], [address], [place], [place] [address]  Magnesium      Status: None   Collection Time: 11/30/23  3:14 PM  Result Value Ref Range   Magnesium  2.1 1.7 - 2.4 mg/dL    Comment: Performed at [facility], [address], [place], [place] [address]  Lactic acid, plasma     Status: None   Collection Time: 11/30/23   4:55 PM  Result Value Ref Range   Lactic Acid, Venous 1.4 0.5 - 1.9 mmol/L    Comment: Performed at [facility], [address], [place], [place] [address]  MRSA Next Gen by PCR, Nasal     Status: None   Collection Time: 11/30/23  9:18 PM   Specimen: Nasal Mucosa; Nasal Swab  Result Value Ref Range   MRSA by PCR Next Gen NOT DETECTED NOT DETECTED    Comment: (NOTE) The GeneXpert MRSA Assay (FDA approved for NASAL specimens only), is one component of a comprehensive MRSA colonization surveillance program. It is not intended to diagnose MRSA infection nor to guide or monitor treatment for MRSA infections. Test performance is not FDA approved in patients less than 61 years old. Performed at [facility], [address], [place], [place] [address]   Glucose, capillary     Status: None   Collection Time: 11/30/23 10:34 PM  Result Value Ref Range   Glucose-Capillary 90 70 - 99 mg/dL    Comment: Glucose reference range applies only to samples taken after fasting for at least 8 hours.  CBC     Status: Abnormal   Collection Time: 12/01/23  2:10 AM  Result Value Ref Range   WBC 9.6 4.0 - 10.5 K/uL   RBC 2.88 (L) 3.87 - 5.11 MIL/uL   Hemoglobin 9.4 (L) 12.0 - 15.0 g/dL   HCT 28.4 (L) 13.2 - 44.0 %   MCV 102.8 (H) 80.0 - 100.0 fL   MCH 32.6 26.0 - 34.0 pg   MCHC 31.8 30.0 - 36.0 g/dL   RDW 10.2 72.5 - 36.6 %   Platelets 145 (L) 150 - 400 K/uL   nRBC 0.0 0.0 - 0.2 %    Comment: Performed at [facility], [address], [place], [place] [address]  Comprehensive metabolic panel     Status: Abnormal   Collection Time: 12/01/23  2:10 AM  Result Value Ref Range   Sodium 135 135 - 145 mmol/L   Potassium 3.8 3.5 - 5.1 mmol/L   Chloride 101 98 - 111 mmol/L   CO2 19 (L) 22 - 32 mmol/L   Glucose, Bld 148 (H) 70 - 99 mg/dL    Comment: Glucose reference range applies only to samples taken after fasting for at least 8  hours.   BUN 36 (H) 6 - 20 mg/dL   Creatinine, Ser 1.61 (H) 0.44 - 1.00 mg/dL    Calcium  6.4 (LL) 8.9 - 10.3 mg/dL    Comment: CRITICAL RESULT CALLED TO, READ BACK BY AND VERIFIED WITH EVANS,D ON 12/01/23 AT 0435 BY PURDIE,J   Total Protein 6.7 6.5 - 8.1 g/dL   Albumin 2.9 (L) 3.5 - 5.0 g/dL   AST 34 15 - 41 U/L   ALT 6 0 - 44 U/L   Alkaline Phosphatase 87 38 - 126 U/L   Total Bilirubin 0.9 0.0 - 1.2 mg/dL   GFR, Estimated 7 (L) >60 mL/min    Comment: (NOTE) Calculated using the CKD-EPI Creatinine Equation (2021)    Anion gap 15 5 - 15    Comment: Performed at [facility], [address], [place], [place] [address]  HIV Antibody (routine testing w rflx)     Status: None   Collection Time: 12/01/23  2:10 AM  Result Value Ref Range   HIV Screen 4th Generation wRfx Non Reactive Non Reactive    Comment: Performed at [facility], [address], [place], [place] [address]  Glucose, capillary     Status: Abnormal   Collection Time: 12/01/23  2:26 AM  Result Value Ref Range   Glucose-Capillary 134 (H) 70 - 99 mg/dL    Comment: Glucose reference range applies only to samples taken after fasting for at least 8 hours.    DG Chest Port 1 View Result Date: 11/30/2023 CLINICAL DATA:  Possible sepsis. Recent dialysis site procedure 11/28/2023. EXAM: PORTABLE CHEST 1 VIEW COMPARISON:  03/01/2014 FINDINGS: Lordotic technique is demonstrated. Right IJ Port-A-Cath with tip over the SVC. Lungs are adequately inflated without focal airspace consolidation or effusion. Possible minimal prominence of the perihilar vessels suggesting mild vascular congestion versus secondary to the degree of hypoinflation. Cardiomediastinal silhouette is unremarkable. Vascular stent over the left axillary region. Remainder of the exam is unchanged. IMPRESSION: 1. No focal airspace consolidation. 2. Possible minimal prominence of the perihilar vessels suggesting mild vascular congestion versus secondary to the degree of hypoinflation. Electronically Signed   By: Roda Cirri M.D.   On: 11/30/2023 16:10    CT Head Wo Contrast Result Date: 11/30/2023 CLINICAL DATA:  Altered mental status.  Weakness. EXAM: CT HEAD WITHOUT CONTRAST TECHNIQUE: Contiguous axial images were obtained from the base of the skull through the vertex without intravenous contrast. RADIATION DOSE REDUCTION: This exam was performed according to the departmental dose-optimization program which includes automated exposure control, adjustment of the mA and/or kV according to patient size and/or use of iterative reconstruction technique. COMPARISON:  03/01/2014 FINDINGS: Brain: No evidence of intracranial hemorrhage, acute infarction, hydrocephalus, extra-axial collection, or mass lesion/mass effect. Mild diffuse cerebellar atrophy noted. Vascular:  No hyperdense vessel or other acute findings. Skull: No evidence of fracture or other significant bone abnormality. Sinuses/Orbits:  No acute findings. Other: None. IMPRESSION: No acute intracranial abnormality. Mild cerebellar atrophy. Electronically Signed   By: Marlyce Sine M.D.   On: 11/30/2023 16:09    Assessment/Plan  **Shock, presumed septic:  source unclear, cultures pending, remains on broad spectrum abx.  AV access does not appear infected.  On midodrine chronically. Remains on low dose NE - currently MAP mid 70s, d/w RN to wean to MAP 65.    **AMS: home gabapentin and trazodone held.  Doses were ok for ESRD. Improved MS.  Suspect hypotension/perfusion was main issue.   **ESRD on HD: MWF [facility]  [facility] - had HD Friday after AVG surgery in hospital - says AVG ran fine.  Plan HD here tomorrow - hopefully will be off pressors by then but as long as remain low dose would prefer to do HD here vs transfer to [facility].  If shock worsens would need to go to [facility] for CRRT.    **Anemia:  Hb high 9s, monitor. If remains admitted can dose with ESA.   **BMM:  Corr Ca low but asymptomatic, phos needs to be checked.  Hold sensipar , cont calcitriol and follow.  Cont outpt velphoro.    **DM:  no recent  A1c on record, meds per primary.   Will follow, reach out with concerns.   Baron Border 12/01/2023, 1:51 PM

## 2023-12-01 NOTE — Progress Notes (Addendum)
 PROGRESS NOTE  Kayla Gibson YQM:[id number] DOB: 05/26/1961 DOA: 11/30/2023 PCP: Tressie Fryer, MD  Brief History:  63 year old female with a history of ESRD (MWF) chronic hypotension on midodrine on dialysis days, diabetes mellitus type 2, peripheral vascular disease status post left BKA, hypertension, gastroparesis, depression presented with altered mental status.  The patient went to [facility] on 11/28/2023 for difficulties with her AV graft.  Apparently the patient underwent fistulogram and some other procedure.  Patient was kept overnight secondary to some low blood pressures.  She was dialyzed on 11/29/2023 through the fistula.  According to the patient's spouse, there was no problems with the dialysis.  She was dialyzed for 2 hours.  She was subsequently discharged home.  11/30/2023 around 1 AM, the patient appeared lethargic.  She was taken to [facility] in [place].  She was noted to have low blood pressures in the ED.  She was given IV fluids in the ED.  Mental status improved and patient was discharged home.  Later in the evening on 11/30/2023, the patient was once again slow to respond.  As result, the patient was brought to [facility] for further evaluation. The patient denies any headache, fevers, chills, chest pain, shortness of breath, coughing, hemoptysis, nausea, vomiting, diarrhea, abdominal pain.  She has had poor oral intake. She denies any new medications.  She has had poor oral intake for the past week. ED Course: Temp was 99.8, initially hypoxic, became hypotensive in the ER with maps as low as 50, slowly improving, starting on a fluid bolus and antibiotics now.  EDP called critical care team at [facility] who recommended we try to stabilize her and admit her to stepdown at [facility], labs noted glucose 152, creatinine 6.6, BUN 34, WBC 8.3, hemoglobin 8.6, lactic acid 1.6, 1.4, CT head with no acute findings, chest x-ray with?  Mild pulmonary vascular congestion     Assessment/Plan: Sepsis -Present on admission - Presented with tachycardia and hypotension - Follow blood cultures - Continue empiric vancomycin  and cefepime  - Chest x-ray without consolidation - Remains hypotensive on Levophed 9 mcg/kg/min - Start midodrine  Acute metabolic encephalopathy - At baseline, she is A&O x 4 - She presented with lethargy and confusion - Secondary to infectious process - Holding gabapentin and trazodone - Gradually improving on antibiotics  ESRD - Last HD 6/13 - Nephrology consulted - Patient states that she has been on dialysis since 2008  Chronic respiratory failure with hypoxia - Chronically on 2 L as needed at home - Currently on 2 L  Diabetes mellitus type 2 with hyperglycemia - Check A1c - Resume lower dose Semglee  - NovoLog  sliding scale  Anemia of CKD - Baseline hemoglobin 9-10  Class II obesity - BMI 39.56 - Lifestyle modification  Hx of BKA (HCC) -s/p Transmetatarsal amputation  Mixed hyperlipidemia -Continue statin       Family Communication:   spouse at bedside 6/15  Consultants:  renal  Code Status:  FULL   DVT Prophylaxis:  [place] Heparin     Procedures: As Listed in Progress Note Above  Antibiotics: Vanc 6/14>> Cefepime  6/14>>      The patient is critically ill with multiple organ systems failure and requires high complexity decision making for assessment and support, frequent evaluation and titration of therapies, application of advanced monitoring technologies and extensive interpretation of multiple databases.  Critical care time - 40 mins.    Subjective: Patient denies fevers, chills, headache, chest pain, dyspnea,  nausea, vomiting, diarrhea, abdominal pain, dysuria, hematuria, hematochezia, and melena.   Objective: Vitals:   12/01/23 1430 12/01/23 1600 12/01/23 1602 12/01/23 1630  BP: (!) 103/47 (!) 126/49 (!) 126/49 (!) 127/99  Pulse:      Resp:    11  Temp:   98 F (36.7 C)   TempSrc:    Oral   SpO2:      Weight:      Height:        Intake/Output Summary (Last 24 hours) at 12/01/2023 1703 Last data filed at 12/01/2023 1555 Gross per 24 hour  Intake 1683.19 ml  Output --  Net 1683.19 ml   Weight change:  Exam:  General:  Pt is alert, follows commands appropriately, not in acute distress HEENT: No icterus, No thrush, No neck mass, [place]/AT Cardiovascular: RRR, S1/S2, no rubs, no gallops Respiratory: bibasilar rales.  No wheeze Abdomen: Soft/+BS, non tender, non distended, no guarding Extremities: trace LE edema, No lymphangitis, No petechiae, No rashes, no synovitis; L-BKA--no open wounds or erythema   Data Reviewed: I have personally reviewed following labs and imaging studies Basic Metabolic Panel: Recent Labs  Lab 11/30/23 1514 12/01/23 0210  NA 137 135  K 4.2 3.8  CL 98 101  CO2 24 19*  GLUCOSE 152* 148*  BUN 34* 36*  CREATININE 6.60* 6.52*  CALCIUM  7.0* 6.4*  MG 2.1  --    Liver Function Tests: Recent Labs  Lab 11/30/23 1514 12/01/23 0210  AST 26 34  ALT 7 6  ALKPHOS 96 87  BILITOT 0.7 0.9  PROT 7.4 6.7  ALBUMIN 3.0* 2.9*   No results for input(s): LIPASE, AMYLASE in the last 168 hours. No results for input(s): AMMONIA in the last 168 hours. Coagulation Profile: Recent Labs  Lab 11/30/23 1514  INR 1.1   CBC: Recent Labs  Lab 11/30/23 1514 12/01/23 0210  WBC 8.3 9.6  NEUTROABS 6.3  --   HGB 8.6* 9.4*  HCT 26.2* 29.6*  MCV 101.9* 102.8*  PLT 144* 145*   Cardiac Enzymes: No results for input(s): CKTOTAL, CKMB, CKMBINDEX, TROPONINI in the last 168 hours. BNP: Invalid input(s): POCBNP CBG: Recent Labs  Lab 11/30/23 2234 12/01/23 0226  GLUCAP 90 134*   HbA1C: No results for input(s): HGBA1C in the last 72 hours. Urine analysis: No results found for: COLORURINE, APPEARANCEUR, LABSPEC, PHURINE, GLUCOSEU, HGBUR, BILIRUBINUR, KETONESUR, PROTEINUR, UROBILINOGEN, NITRITE,  LEUKOCYTESUR Sepsis Labs: @LABRCNTIP (procalcitonin:4,lacticidven:4) ) Recent Results (from the past 240 hours)  Blood Culture (routine x 2)     Status: None (Preliminary result)   Collection Time: 11/30/23  3:14 PM   Specimen: Right Antecubital; Blood  Result Value Ref Range Status   Specimen Description   Final    RIGHT ANTECUBITAL BOTTLES DRAWN AEROBIC AND ANAEROBIC   Special Requests Blood Culture adequate volume  Final   Culture   Final    NO GROWTH < 24 HOURS Performed at [facility], [address], [place], [place] [address]    Report Status PENDING  Incomplete  Blood Culture (routine x 2)     Status: None (Preliminary result)   Collection Time: 11/30/23  3:14 PM   Specimen: BLOOD RIGHT HAND  Result Value Ref Range Status   Specimen Description   Final    BLOOD RIGHT HAND BOTTLES DRAWN AEROBIC AND ANAEROBIC   Special Requests Blood Culture adequate volume  Final   Culture   Final    NO GROWTH < 24 HOURS Performed at [facility],  [address] [place], [place] [address]    Report Status PENDING  Incomplete  MRSA Next Gen by PCR, Nasal     Status: None   Collection Time: 11/30/23  9:18 PM   Specimen: Nasal Mucosa; Nasal Swab  Result Value Ref Range Status   MRSA by PCR Next Gen NOT DETECTED NOT DETECTED Final    Comment: (NOTE) The GeneXpert MRSA Assay (FDA approved for NASAL specimens only), is one component of a comprehensive MRSA colonization surveillance program. It is not intended to diagnose MRSA infection nor to guide or monitor treatment for MRSA infections. Test performance is not FDA approved in patients less than 87 years old. Performed at [facility], [address], [place], [place] [address]      Scheduled Meds:  aspirin  EC  81 mg Oral q morning   atorvastatin  40 mg Oral Daily   calcitRIOL  0.5 mcg Oral BID   calcium  acetate  667 mg Oral TID with meals   Chlorhexidine Gluconate Cloth  6 each Topical Daily   [START ON 12/02/2023] Chlorhexidine  Gluconate Cloth  6 each Topical Q0600   DULoxetine  20 mg Oral Daily   [START ON 12/02/2023] famotidine   20 mg Oral QHS   heparin   5,000 Units Subcutaneous Q8H   insulin  glargine-yfgn  15 Units Subcutaneous QHS   midodrine  10 mg Oral TID WC   multivitamin  1 tablet Oral QHS   Continuous Infusions:  [START ON 12/02/2023] ceFEPime  (MAXIPIME ) IV     norepinephrine (LEVOPHED) Adult infusion 2 mcg/min (12/01/23 1555)   [START ON 12/02/2023] vancomycin       Procedures/Studies: DG Chest Port 1 View Result Date: 11/30/2023 CLINICAL DATA:  Possible sepsis. Recent dialysis site procedure 11/28/2023. EXAM: PORTABLE CHEST 1 VIEW COMPARISON:  03/01/2014 FINDINGS: Lordotic technique is demonstrated. Right IJ Port-A-Cath with tip over the SVC. Lungs are adequately inflated without focal airspace consolidation or effusion. Possible minimal prominence of the perihilar vessels suggesting mild vascular congestion versus secondary to the degree of hypoinflation. Cardiomediastinal silhouette is unremarkable. Vascular stent over the left axillary region. Remainder of the exam is unchanged. IMPRESSION: 1. No focal airspace consolidation. 2. Possible minimal prominence of the perihilar vessels suggesting mild vascular congestion versus secondary to the degree of hypoinflation. Electronically Signed   By: Roda Cirri M.D.   On: 11/30/2023 16:10   CT Head Wo Contrast Result Date: 11/30/2023 CLINICAL DATA:  Altered mental status.  Weakness. EXAM: CT HEAD WITHOUT CONTRAST TECHNIQUE: Contiguous axial images were obtained from the base of the skull through the vertex without intravenous contrast. RADIATION DOSE REDUCTION: This exam was performed according to the departmental dose-optimization program which includes automated exposure control, adjustment of the mA and/or kV according to patient size and/or use of iterative reconstruction technique. COMPARISON:  03/01/2014 FINDINGS: Brain: No evidence of intracranial hemorrhage,  acute infarction, hydrocephalus, extra-axial collection, or mass lesion/mass effect. Mild diffuse cerebellar atrophy noted. Vascular:  No hyperdense vessel or other acute findings. Skull: No evidence of fracture or other significant bone abnormality. Sinuses/Orbits:  No acute findings. Other: None. IMPRESSION: No acute intracranial abnormality. Mild cerebellar atrophy. Electronically Signed   By: Marlyce Sine M.D.   On: 11/30/2023 16:09    Demaris Fillers, DO  [facility]  If 7PM-7AM, please contact night-coverage [email or web address] Password [email or web address] 12/01/2023, 5:03 PM   LOS: 1 day

## 2023-12-01 NOTE — TOC CM/SW Note (Signed)
 Transition of Care [facility]) - Inpatient Brief Assessment   Patient Details  Name: Kayla Gibson MRN: [id number] Date of Birth: [birth year]  Transition of Care [facility]) CM/SW Contact:    Grandville Lax, LCSWA Phone Number: 12/01/2023, 11:32 AM   Clinical Narrative:  Transition of Care Department [facility]) has reviewed patient and no TOC needs have been identified at this time. We will continue to monitor patient advancement through interdiciplinary progression rounds. If new patient transition needs arise, please place a TOC consult.  Transition of Care Asessment: Insurance and Status: Insurance coverage has been reviewed Patient has primary care physician: Yes Home environment has been reviewed: From home Prior level of function:: Independent Prior/Current Home Services: No current home services Social Drivers of Health Review: SDOH reviewed no interventions necessary Readmission risk has been reviewed: Yes Transition of care needs: no transition of care needs at this time

## 2023-12-02 ENCOUNTER — Inpatient Hospital Stay (HOSPITAL_COMMUNITY)

## 2023-12-02 ENCOUNTER — Encounter (HOSPITAL_COMMUNITY): Payer: Self-pay | Admitting: Internal Medicine

## 2023-12-02 DIAGNOSIS — A419 Sepsis, unspecified organism: Secondary | ICD-10-CM | POA: Diagnosis not present

## 2023-12-02 DIAGNOSIS — I959 Hypotension, unspecified: Secondary | ICD-10-CM | POA: Diagnosis not present

## 2023-12-02 DIAGNOSIS — N186 End stage renal disease: Secondary | ICD-10-CM | POA: Diagnosis not present

## 2023-12-02 DIAGNOSIS — J9611 Chronic respiratory failure with hypoxia: Secondary | ICD-10-CM | POA: Diagnosis not present

## 2023-12-02 LAB — CBC
HCT: 28.6 % — ABNORMAL LOW (ref 36.0–46.0)
Hemoglobin: 9.4 g/dL — ABNORMAL LOW (ref 12.0–15.0)
MCH: 33.5 pg (ref 26.0–34.0)
MCHC: 32.9 g/dL (ref 30.0–36.0)
MCV: 101.8 fL — ABNORMAL HIGH (ref 80.0–100.0)
Platelets: 147 10*3/uL — ABNORMAL LOW (ref 150–400)
RBC: 2.81 MIL/uL — ABNORMAL LOW (ref 3.87–5.11)
RDW: 14.4 % (ref 11.5–15.5)
WBC: 10 10*3/uL (ref 4.0–10.5)
nRBC: 0 % (ref 0.0–0.2)

## 2023-12-02 LAB — RESPIRATORY PANEL BY PCR

## 2023-12-02 LAB — RENAL FUNCTION PANEL
Albumin: 2.6 g/dL — ABNORMAL LOW (ref 3.5–5.0)
Anion gap: 13 (ref 5–15)
BUN: 46 mg/dL — ABNORMAL HIGH (ref 6–20)
CO2: 22 mmol/L (ref 22–32)
Calcium: 7.2 mg/dL — ABNORMAL LOW (ref 8.9–10.3)
Chloride: 102 mmol/L (ref 98–111)
Creatinine, Ser: 8.47 mg/dL — ABNORMAL HIGH (ref 0.44–1.00)
GFR, Estimated: 5 mL/min — ABNORMAL LOW (ref >60)
Glucose, Bld: 151 mg/dL — ABNORMAL HIGH (ref 70–99)
Phosphorus: 6.7 mg/dL — ABNORMAL HIGH (ref 2.5–4.6)
Potassium: 4.2 mmol/L (ref 3.5–5.1)
Sodium: 137 mmol/L (ref 135–145)

## 2023-12-02 LAB — GLUCOSE, CAPILLARY
Glucose-Capillary: 151 mg/dL — ABNORMAL HIGH (ref 70–99)
Glucose-Capillary: 99 mg/dL (ref 70–99)

## 2023-12-02 LAB — HEPATITIS B SURFACE ANTIGEN: Hepatitis B Surface Ag: NONREACTIVE

## 2023-12-02 MED ORDER — LIDOCAINE-PRILOCAINE 2.5-2.5 % EX CREA: 1.0000 | TOPICAL_CREAM | CUTANEOUS | Status: DC | PRN

## 2023-12-02 MED ORDER — PENTAFLUOROPROP-TETRAFLUOROETH EX AERO
1.0000 | INHALATION_SPRAY | CUTANEOUS | Status: DC | PRN
  Filled 2023-12-02: qty 30

## 2023-12-02 MED ORDER — LIDOCAINE HCL (PF) 1 % IJ SOLN: 5.0000 mL | INTRAMUSCULAR | Status: DC | PRN

## 2023-12-02 NOTE — Plan of Care (Signed)

## 2023-12-02 NOTE — TOC Initial Note (Signed)
 Transition of Care [facility]) - Initial/Assessment Note    Patient Details  Name: Kayla Gibson MRN: [id number] Date of Birth: [birth year]  Transition of Care [facility]) CM/SW Contact:    Cyndie Dredge, LCSWA Phone Number: 12/02/2023, 2:09 PM  Clinical Narrative:                  Writer spoke with patient spouse and assessed patient. Spouse shared that he assist with patient needs, does the driving, and that she has a walker and WC at home. Spouse also shared that patient is not active with any home health care services. TOC to follow.   Expected Discharge Plan: Home/Self Care Barriers to Discharge: Continued Medical Work up   Patient Goals and CMS Choice Patient states their goals for this hospitalization and ongoing recovery are:: return back home CMS Medicare.gov Compare Post Acute Care list provided to:: Patient Represenative (must comment) (Spouse- Aimee Houseman) Choice offered to / list presented to : Spouse      Expected Discharge Plan and Services In-house Referral: Clinical Social Work   Post Acute Care Choice: Durable Medical Equipment Living arrangements for the past 2 months: Mobile Home                                      Prior Living Arrangements/Services Living arrangements for the past 2 months: Mobile Home Lives with:: Spouse Patient language and need for interpreter reviewed:: Yes Do you feel safe going back to the place where you live?: Yes      Need for Family Participation in Patient Care: Yes (Comment) Care giver support system in place?: No (comment) Current home services: DME Criminal Activity/Legal Involvement Pertinent to Current Situation/Hospitalization: No - Comment as needed  Activities of Daily Living   ADL Screening (condition at time of admission) Independently performs ADLs?: Yes (appropriate for developmental age) Is the patient deaf or have difficulty hearing?: No Does the patient have difficulty seeing, even when wearing  glasses/contacts?: No Does the patient have difficulty concentrating, remembering, or making decisions?: No  Permission Sought/Granted      Share Information with NAME: Aimee Houseman     Permission granted to share info w Relationship: Spouse     Emotional Assessment Appearance:: Appears stated age     Orientation: : Oriented to Self Alcohol / Substance Use: Not Applicable Psych Involvement: No (comment)  Admission diagnosis:  ESRD (end stage renal disease) (HCC) [N18.6] Sepsis (HCC) [A41.9] Hypotension, unspecified hypotension type [I95.9] Sepsis, due to unspecified organism, unspecified whether acute organ dysfunction present [facility]) [A41.9] Patient Active Problem List   Diagnosis Date Noted   Sepsis due to undetermined organism (HCC) 12/01/2023   Chronic respiratory failure with hypoxia (HCC) 12/01/2023   Sepsis (HCC) 11/30/2023   Hx of BKA (HCC) 11/30/2023   ESRD on hemodialysis (HCC)    HCAP (healthcare-associated pneumonia) 02/27/2014   Hypoxia 02/27/2014   Chest pain 02/27/2014   Hypotension 11/07/2011   Gastroparesis 11/05/2011   Abnormal CT of the abdomen 11/05/2011   Chronic osteomyelitis (HCC) 11/05/2011   Gastroenteritis/enteritis 11/04/2011   ESRD (end stage renal disease) (HCC) 11/04/2011   HTN (hypertension) 11/04/2011   Type 2 diabetes mellitus with chronic kidney disease, without long-term current use of insulin  (HCC) 11/04/2011   Depression 11/04/2011   PCP:  Tressie Fryer, MD Pharmacy:   [facility] [phone] - [place], [place] - [address]  [place] [address] Phone: [phone] Fax: [phone]     Social Drivers of Health (SDOH) Social History: SDOH Screenings   Food Insecurity: No Food Insecurity (11/30/2023)  Housing: Low Risk  (11/30/2023)  Transportation Needs: No Transportation Needs (11/30/2023)  Utilities: Not At Risk (11/30/2023)  Financial Resource Strain: Low Risk  (11/27/2022)   Received  from [facility]  Physical Activity: Inactive (11/27/2022)   Received from [facility]  Social Connections: Moderately Integrated (11/30/2023)  Stress: No Stress Concern Present (11/27/2022)   Received from [facility]  Tobacco Use: Low Risk  (12/02/2023)   SDOH Interventions:     Readmission Risk Interventions    12/02/2023    2:00 PM  Readmission Risk Prevention Plan  Transportation Screening Complete  Home Care Screening Complete  Medication Review (RN CM) Complete

## 2023-12-02 NOTE — Evaluation (Signed)
 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 female admitted to the ICU 11/30/23 d/t increasing lethargy and weakness. Pt recently admitted to [facility] for fistulogram procedure, d/c'd on 6/13. Family noted increased somnolence, took her to [facility] in [place] 6/13 then brought her to [facility] due to continued issues. Found to have shock/hypotension with metabolic encephalopathy. PMHx significant for:  ESRD on hemodialysis, chronic hypotension on midodrine, type 2 diabetes mellitus, peripheral vascular disease, BKA, hypertension, gastroparesis, depression. ST eval requested for BSE as the pt reports her throat hurts when swallowing.    CXR 6/14: 1. No focal airspace consolidation. 2. Possible minimal prominence of the perihilar vessels suggesting mild vascular congestion versus secondary to the degree of hypoinflation.   Assessment / Plan / Recommendation  Clinical Impression  Pt endorses new onset of throat pain following fistulogram procedure but denies she was intubated for it. Also endorses hx of reflux (which she manages with OTC antacids), increased vocal hoarseness in AM, and  globus sensation with bread and meats. Oral mech was [facility] with natural dentition noted.  Her voice was noted to be hoarse during the eval. Mastication of regular texture solid graham cracker was timely and thorough with no difficulty. Pt did state it was dry and IND administered thin liquid water  via cup and straw. No overt s/sx of aspiration were observed across consistencies trialed during BSE. Pt did not c/o throat pain during this session but states it is worse with regular texture solids.    Pt presents with WFL oropharyngeal function but c/o symptoms concerning for esophageal dysphagia. Pt is safe for regular with thin at this time; however, rec continue mech soft diet with thin liquids per pt preference. Recommended GI referral to address potential LPR and/or esophageal dysphagia symptoms. Provided education on esophageal dysphagia strategies of 1) upright for meals and 30 mins after meals, 2) alternate solids/liquids, 3) small bites/sips, 4) slow rate, 5) chop up meats and moisten with gravy. Pt verbalized understanding of these strategies. No ST services warranted at this time.   SLP Visit Diagnosis: Dysphagia, unspecified (R13.10)    Aspiration Risk  No limitations    Diet Recommendation Dysphagia 3 (Mech soft);Thin liquid    Liquid Administration via: Cup;Straw Medication Administration: Whole meds with liquid Supervision: Patient able to self feed Compensations: Slow rate;Small sips/bites;Follow solids with liquid Postural Changes: Seated upright at 90 degrees;Remain upright for at least 30 minutes after po intake    Other  Recommendations Recommended Consults: Consider GI evaluation Oral Care Recommendations: Oral care BID     Assistance Recommended at Discharge  None    Swallow Study   General Date of Onset: 11/29/23 HPI: Pt is a 63 y.o. female admitted to the ICU 11/30/23 d/t increasing lethargy and weakness. Pt recently admitted to [facility] for fistulogram  procedure, d/c'd on 6/13. Family noted increased somnolence, took her to [facility] in [place] 6/13 then brought her to [facility] due to continued issues. Found to have shock/hypotension with metabolic encephalopathy. PMHx significant for:  ESRD on hemodialysis, chronic hypotension on midodrine, type 2 diabetes mellitus, peripheral vascular disease, BKA, hypertension, gastroparesis, depression. Type of Study: Bedside Swallow Evaluation Previous Swallow Assessment: None Diet Prior to this Study: Other (Comment);Thin liquids (Level 0) (Soft) Temperature Spikes Noted: No Respiratory Status: Room air History of Recent Intubation: No Behavior/Cognition: Alert;Cooperative;Pleasant mood Oral Cavity Assessment: Within Functional Limits Oral Care Completed by SLP: No Oral Cavity - Dentition: Adequate natural dentition Vision: Functional for self-feeding Self-Feeding Abilities: Able to feed self Patient Positioning: Upright in chair Baseline Vocal Quality: Hoarse Volitional Cough: Strong Volitional Swallow: Able to elicit    Oral/Motor/Sensory Function Overall Oral Motor/Sensory Function: Within functional limits   Ice Chips Ice chips: Not tested   Thin Liquid Thin Liquid: Within functional limits Presentation: Cup;Self Fed;Straw    Nectar Thick Nectar Thick Liquid: Not tested   Honey Thick Honey Thick Liquid: Not tested   Puree Puree: Within functional limits Presentation: Self Fed;Spoon   Solid     Solid: Within functional limits Presentation: Self Fed      Caretha Chapel, MA CCC-SLP 12/02/2023,12:40 PM

## 2023-12-02 NOTE — Progress Notes (Signed)
 Patient ID: Kayla Gibson, female   DOB: Jul 19, 1960, 63 y.o.   MRN: [id number] [facility] Progress Note   Assessment/ Plan:   1.  Shock/hypotension with metabolic encephalopathy: Unclear source.  On broad-spectrum antimicrobial therapy with vancomycin  and cefepime . 2. ESRD: She is typically on Monday/Wednesday/Friday schedule for hemodialysis and is scheduled for dialysis today.  She is currently on Levophed drip as well as oral midodrine but has generous blood pressures that should allow for both hemodialysis as well as down titration of norepinephrine. 3. Anemia: Without overt blood loss.  Hemoglobin hematocrit remained stable and we will resume ESA if remains an inpatient on Wednesday. 4. CKD-MBD: Elevated phosphorus noted around calcium  level that is low- Continue calcium  acetate 3 times daily AC for phosphorus binding/hypocalcemia. 5. Nutrition: On renal diet, will continue to follow.  Subjective:   Reports to be feeling fair, denies any chest pain or shortness of breath.   Objective:   BP (!) 146/60   Pulse 84   Temp 98.1 F (36.7 C) (Oral)   Resp 16   Ht 5' 2 (1.575 m)   Wt 98.1 kg   SpO2 100%   BMI 39.56 kg/m   Physical Exam: Gen: Comfortably propped up in bed, eating breakfast CVS: Pulse regular rhythm, normal rate, S1 and S2 normal Resp: Clear to auscultation, no rales/rhonchi Abd: Soft, obese, bowel sounds normal Ext: Status post left leg TMA, right leg BKA.  Left forearm AV graft with clean gauze dressing wrapped  Labs: BMET Recent Labs  Lab 11/30/23 1514 12/01/23 0210 12/02/23 0414  NA 137 135 137  K 4.2 3.8 4.2  CL 98 101 102  CO2 24 19* 22  GLUCOSE 152* 148* 151*  BUN 34* 36* 46*  CREATININE 6.60* 6.52* 8.47*  CALCIUM  7.0* 6.4* 7.2*  PHOS  --   --  6.7*   CBC Recent Labs  Lab 11/30/23 1514 12/01/23 0210 12/02/23 0414  WBC 8.3 9.6 10.0  NEUTROABS 6.3  --   --   HGB 8.6* 9.4* 9.4*  HCT 26.2* 29.6* 28.6*  MCV 101.9* 102.8*  101.8*  PLT 144* 145* 147*      Medications:     aspirin  EC  81 mg Oral q morning   atorvastatin  40 mg Oral Daily   calcitRIOL  0.5 mcg Oral BID   calcium  acetate  667 mg Oral TID with meals   Chlorhexidine Gluconate Cloth  6 each Topical Daily   Chlorhexidine Gluconate Cloth  6 each Topical Q0600   DULoxetine  20 mg Oral Daily   famotidine   20 mg Oral QHS   heparin   5,000 Units Subcutaneous Q8H   insulin  glargine-yfgn  15 Units Subcutaneous QHS   midodrine  10 mg Oral TID WC   multivitamin  1 tablet Oral QHS   Clevester Dally, MD 12/02/2023, 7:52 AM

## 2023-12-02 NOTE — Progress Notes (Signed)
   HEMODIALYSIS TREATMENT NOTE:  3:10 heparin -free treatment completed using venous portion of left forearm loop AVG.  Rising venous pressures  / clotting circuit prompted ending of treatment with 10m remaining.  All blood was able to be returned.  Net UF 1.9 liters.  Post-HD:  12/02/23 2245  Vitals  Temp 98.1 F (36.7 C)  Temp Source Oral  BP (!) 104/54  MAP (mmHg) 72  BP Location Right Wrist  BP Method Automatic  Patient Position (if appropriate) Sitting  Pulse Rate 93  Pulse Rate Source Monitor  ECG Heart Rate 93  Resp 17  Post Treatment  Dialyzer Clearance Heavily streaked  Hemodialysis Intake (mL) 250 mL  Liters Processed 68.1  Fluid Removed (mL) 1900 mL  Tolerated HD Treatment Yes  Post-Hemodialysis Comments Goal nearly met. Circuit began clotting during last 76m  AVG/AVF Arterial Site Held (minutes) 15 minutes  AVG/AVF Venous Site Held (minutes) 15 minutes  Vascular Access Left Forearm Arteriovenous vein graft  No Placement Date or Time found.   Orientation: Left  Access Location: Forearm  Access Type: (c) Arteriovenous vein graft  Fistula / Graft Assessment Thrill;Bruit  Status Patent   Waunita Haff, RN AP ICU4

## 2023-12-02 NOTE — Progress Notes (Addendum)
 PROGRESS NOTE  Kayla Gibson ZOX:[id number] DOB: April 21, 1961 DOA: 11/30/2023 PCP: Tressie Fryer, MD  Brief History:  63 year old female with a history of ESRD (MWF) chronic hypotension on midodrine on dialysis days, diabetes mellitus type 2, peripheral vascular disease status post left BKA, hypertension, gastroparesis, depression presented with altered mental status.  The patient went to [facility] on 11/28/2023 for difficulties with her AV graft.  Apparently the patient underwent fistulogram and some other procedure.  Patient was kept overnight secondary to some low blood pressures.  She was dialyzed on 11/29/2023 through the fistula.  According to the patient's spouse, there was no problems with the dialysis.  She was dialyzed for 2 hours.  She was subsequently discharged home.  11/30/2023 around 1 AM, the patient appeared lethargic.  She was taken to [facility] in [place].  She was noted to have low blood pressures in the ED.  She was given IV fluids in the ED.  Mental status improved and patient was discharged home.  Later in the evening on 11/30/2023, the patient was once again slow to respond.  As result, the patient was brought to [facility] for further evaluation. The patient denies any headache, fevers, chills, chest pain, shortness of breath, coughing, hemoptysis, nausea, vomiting, diarrhea, abdominal pain.  She has had poor oral intake. She denies any new medications.  She has had poor oral intake for the past week. ED Course: Temp was 99.8, initially hypoxic, became hypotensive in the ER with maps as low as 50, slowly improving, starting on a fluid bolus and antibiotics now.  EDP called critical care team at [facility] who recommended we try to stabilize her and admit her to stepdown at [facility], labs noted glucose 152, creatinine 6.6, BUN 34, WBC 8.3, hemoglobin 8.6, lactic acid 1.6, 1.4, CT head with no acute findings, chest x-ray with?  Mild pulmonary vascular congestion     Assessment/Plan: Sepsis -Present on admission - Presented with tachycardia and hypotension - Follow blood cultures--neg to date - Continue empiric vancomycin  and cefepime  - Chest x-ray without consolidation - initially on Levophed 9 mcg/kg/min>>> weaned off in a.m. 12/02/23 - Continue midodrine TID   Acute metabolic encephalopathy - At baseline, she is A&O x 4 - She presented with lethargy and confusion - Secondary to infectious process - Holding gabapentin and trazodone - Now back to baseline   ESRD - Last HD 6/13 - Nephrology consult appreciated - Patient states that she has been on dialysis since 2008   Chronic respiratory failure with hypoxia - Chronically on 2 L as needed at home - Currently on 2 L>> weaned to room air -CT chest -Viral respiratory panel   Diabetes mellitus type 2 with hyperglycemia - 09/23/2023 A1c--6.7 - Resume lower dose Semglee  - NovoLog  sliding scale   Anemia of CKD - Baseline hemoglobin 9-10   Class II obesity - BMI 39.56 - Lifestyle modification   Hx of BKA (HCC) -s/p Transmetatarsal amputation   Mixed hyperlipidemia -Continue statin             Family Communication:   spouse at bedside 6/16   Consultants:  renal   Code Status:  FULL    DVT Prophylaxis:  Metzger Heparin       Procedures: As Listed in Progress Note Above   Antibiotics: Vanc 6/14>> Cefepime  6/14>>      Subjective: Patient denies fevers, chills, headache, chest pain, dyspnea, nausea, vomiting, diarrhea, abdominal pain, dysuria, hematuria, hematochezia, and melena.  Objective: Vitals:   12/02/23 1120 12/02/23 1130 12/02/23 1200 12/02/23 1230  BP:  (!) 107/54 (!) 112/48 128/66  Pulse:  76 72 83  Resp:  12 12 14   Temp: 97.9 F (36.6 C)     TempSrc: Oral     SpO2:  98% 97% 100%  Weight:      Height:        Intake/Output Summary (Last 24 hours) at 12/02/2023 1309 Last data filed at 12/02/2023 1032 Gross per 24 hour  Intake 142.01 ml  Output --   Net 142.01 ml   Weight change:  Exam:  General:  Pt is alert, follows commands appropriately, not in acute distress HEENT: No icterus, No thrush, No neck mass, [place]/AT Cardiovascular: RRR, S1/S2, no rubs, no gallops Respiratory: Bilateral rales, right greater than left.  No wheezing Abdomen: Soft/+BS, non tender, non distended, no guarding Extremities: No edema, No lymphangitis, No petechiae, No rashes, no synovitis   Data Reviewed: I have personally reviewed following labs and imaging studies Basic Metabolic Panel: Recent Labs  Lab 11/30/23 1514 12/01/23 0210 12/02/23 0414  NA 137 135 137  K 4.2 3.8 4.2  CL 98 101 102  CO2 24 19* 22  GLUCOSE 152* 148* 151*  BUN 34* 36* 46*  CREATININE 6.60* 6.52* 8.47*  CALCIUM  7.0* 6.4* 7.2*  MG 2.1  --   --   PHOS  --   --  6.7*   Liver Function Tests: Recent Labs  Lab 11/30/23 1514 12/01/23 0210 12/02/23 0414  AST 26 34  --   ALT 7 6  --   ALKPHOS 96 87  --   BILITOT 0.7 0.9  --   PROT 7.4 6.7  --   ALBUMIN 3.0* 2.9* 2.6*   No results for input(s): LIPASE, AMYLASE in the last 168 hours. No results for input(s): AMMONIA in the last 168 hours. Coagulation Profile: Recent Labs  Lab 11/30/23 1514  INR 1.1   CBC: Recent Labs  Lab 11/30/23 1514 12/01/23 0210 12/02/23 0414  WBC 8.3 9.6 10.0  NEUTROABS 6.3  --   --   HGB 8.6* 9.4* 9.4*  HCT 26.2* 29.6* 28.6*  MCV 101.9* 102.8* 101.8*  PLT 144* 145* 147*   Cardiac Enzymes: No results for input(s): CKTOTAL, CKMB, CKMBINDEX, TROPONINI in the last 168 hours. BNP: Invalid input(s): POCBNP CBG: Recent Labs  Lab 11/30/23 2234 12/01/23 0226 12/01/23 2140  GLUCAP 90 134* 165*   HbA1C: No results for input(s): HGBA1C in the last 72 hours. Urine analysis: No results found for: COLORURINE, APPEARANCEUR, LABSPEC, PHURINE, GLUCOSEU, HGBUR, BILIRUBINUR, KETONESUR, PROTEINUR, UROBILINOGEN, NITRITE, LEUKOCYTESUR Sepsis  Labs: @LABRCNTIP (procalcitonin:4,lacticidven:4) ) Recent Results (from the past 240 hours)  Blood Culture (routine x 2)     Status: None (Preliminary result)   Collection Time: 11/30/23  3:14 PM   Specimen: Right Antecubital; Blood  Result Value Ref Range Status   Specimen Description   Final    RIGHT ANTECUBITAL BOTTLES DRAWN AEROBIC AND ANAEROBIC   Special Requests Blood Culture adequate volume  Final   Culture   Final    NO GROWTH 2 DAYS Performed at [facility], [address], [place], [place] [address]    Report Status PENDING  Incomplete  Blood Culture (routine x 2)     Status: None (Preliminary result)   Collection Time: 11/30/23  3:14 PM   Specimen: BLOOD RIGHT HAND  Result Value Ref Range Status   Specimen Description   Final    BLOOD  RIGHT HAND BOTTLES DRAWN AEROBIC AND ANAEROBIC   Special Requests Blood Culture adequate volume  Final   Culture   Final    NO GROWTH 2 DAYS Performed at [facility], [address], [place], [place] [address]    Report Status PENDING  Incomplete  MRSA Next Gen by PCR, Nasal     Status: None   Collection Time: 11/30/23  9:18 PM   Specimen: Nasal Mucosa; Nasal Swab  Result Value Ref Range Status   MRSA by PCR Next Gen NOT DETECTED NOT DETECTED Final    Comment: (NOTE) The GeneXpert MRSA Assay (FDA approved for NASAL specimens only), is one component of a comprehensive MRSA colonization surveillance program. It is not intended to diagnose MRSA infection nor to guide or monitor treatment for MRSA infections. Test performance is not FDA approved in patients less than 28 years old. Performed at [facility], [address], [place], [place] [address]      Scheduled Meds:  aspirin  EC  81 mg Oral q morning   atorvastatin  40 mg Oral Daily   calcitRIOL  0.5 mcg Oral BID   calcium  acetate  667 mg Oral TID with meals   Chlorhexidine Gluconate Cloth  6 each Topical Daily   Chlorhexidine Gluconate Cloth  6 each Topical Q0600   DULoxetine   20 mg Oral Daily   famotidine   20 mg Oral QHS   heparin   5,000 Units Subcutaneous Q8H   insulin  glargine-yfgn  15 Units Subcutaneous QHS   midodrine  10 mg Oral TID WC   multivitamin  1 tablet Oral QHS   Continuous Infusions:  ceFEPime  (MAXIPIME ) IV     norepinephrine (LEVOPHED) Adult infusion Stopped (12/02/23 0853)   vancomycin       Procedures/Studies: DG Chest Port 1 View Result Date: 11/30/2023 CLINICAL DATA:  Possible sepsis. Recent dialysis site procedure 11/28/2023. EXAM: PORTABLE CHEST 1 VIEW COMPARISON:  03/01/2014 FINDINGS: Lordotic technique is demonstrated. Right IJ Port-A-Cath with tip over the SVC. Lungs are adequately inflated without focal airspace consolidation or effusion. Possible minimal prominence of the perihilar vessels suggesting mild vascular congestion versus secondary to the degree of hypoinflation. Cardiomediastinal silhouette is unremarkable. Vascular stent over the left axillary region. Remainder of the exam is unchanged. IMPRESSION: 1. No focal airspace consolidation. 2. Possible minimal prominence of the perihilar vessels suggesting mild vascular congestion versus secondary to the degree of hypoinflation. Electronically Signed   By: Roda Cirri M.D.   On: 11/30/2023 16:10   CT Head Wo Contrast Result Date: 11/30/2023 CLINICAL DATA:  Altered mental status.  Weakness. EXAM: CT HEAD WITHOUT CONTRAST TECHNIQUE: Contiguous axial images were obtained from the base of the skull through the vertex without intravenous contrast. RADIATION DOSE REDUCTION: This exam was performed according to the departmental dose-optimization program which includes automated exposure control, adjustment of the mA and/or kV according to patient size and/or use of iterative reconstruction technique. COMPARISON:  03/01/2014 FINDINGS: Brain: No evidence of intracranial hemorrhage, acute infarction, hydrocephalus, extra-axial collection, or mass lesion/mass effect. Mild diffuse cerebellar atrophy  noted. Vascular:  No hyperdense vessel or other acute findings. Skull: No evidence of fracture or other significant bone abnormality. Sinuses/Orbits:  No acute findings. Other: None. IMPRESSION: No acute intracranial abnormality. Mild cerebellar atrophy. Electronically Signed   By: Marlyce Sine M.D.   On: 11/30/2023 16:09    Demaris Fillers, DO  [facility]  If 7PM-7AM, please contact night-coverage [email or web address] Password [email or web address] 12/02/2023, 1:09 PM   LOS: 2 days

## 2023-12-03 DIAGNOSIS — A419 Sepsis, unspecified organism: Secondary | ICD-10-CM | POA: Diagnosis not present

## 2023-12-03 DIAGNOSIS — J181 Lobar pneumonia, unspecified organism: Secondary | ICD-10-CM

## 2023-12-03 DIAGNOSIS — Z853 Personal history of malignant neoplasm of breast: Secondary | ICD-10-CM

## 2023-12-03 DIAGNOSIS — N186 End stage renal disease: Secondary | ICD-10-CM | POA: Diagnosis not present

## 2023-12-03 LAB — HEPATITIS B SURFACE ANTIBODY, QUANTITATIVE: Hep B S AB Quant (Post): 136 m[IU]/mL

## 2023-12-03 LAB — GLUCOSE, CAPILLARY: Glucose-Capillary: 178 mg/dL — ABNORMAL HIGH (ref 70–99)

## 2023-12-03 MED ORDER — DIPHENHYDRAMINE HCL 25 MG PO CAPS
25.0000 mg | ORAL_CAPSULE | Freq: Once | ORAL | Status: AC
  Administered 2023-12-03: 25 mg via ORAL
  Filled 2023-12-03: qty 1

## 2023-12-03 MED ORDER — DOXYCYCLINE HYCLATE 100 MG PO TABS
100.0000 mg | ORAL_TABLET | Freq: Two times a day (BID) | ORAL | Status: DC
  Administered 2023-12-03: 100 mg via ORAL
  Filled 2023-12-03: qty 1

## 2023-12-03 NOTE — Plan of Care (Signed)

## 2023-12-03 NOTE — Progress Notes (Signed)
 PROGRESS NOTE  Kayla Gibson ZOX:[id number] DOB: [birth year] DOA: 11/30/2023 PCP: Tressie Fryer, MD  Brief History:  63 year old female with a history of ESRD (MWF) chronic hypotension on midodrine on dialysis days, diabetes mellitus type 2, peripheral vascular disease status post left BKA, hypertension, gastroparesis, depression presented with altered mental status.  The patient went to [facility] on 11/28/2023 for difficulties with her AV graft.  Apparently the patient underwent fistulogram and some other procedure.  Patient was kept overnight secondary to some low blood pressures.  She was dialyzed on 11/29/2023 through the fistula.  According to the patient's spouse, there was no problems with the dialysis.  She was dialyzed for 2 hours.  She was subsequently discharged home.  11/30/2023 around 1 AM, the patient appeared lethargic.  She was taken to [facility] in [place].  She was noted to have low blood pressures in the ED.  She was given IV fluids in the ED.  Mental status improved and patient was discharged home.  Later in the evening on 11/30/2023, the patient was once again slow to respond.  As result, the patient was brought to [facility] for further evaluation. The patient denies any headache, fevers, chills, chest pain, shortness of breath, coughing, hemoptysis, nausea, vomiting, diarrhea, abdominal pain.  She has had poor oral intake. She denies any new medications.  She has had poor oral intake for the past week. ED Course: Temp was 99.8, initially hypoxic, became hypotensive in the ER with maps as low as 50, slowly improving, starting on a fluid bolus and antibiotics now.  EDP called critical care team at [facility] who recommended we try to stabilize her and admit her to stepdown at [facility], labs noted glucose 152, creatinine 6.6, BUN 34, WBC 8.3, hemoglobin 8.6, lactic acid 1.6, 1.4, CT head with no acute findings, chest x-ray with?  Mild pulmonary vascular congestion     Assessment/Plan: Sepsis with septic shock -Present on admission - due to pneumonia - Presented with tachycardia and hypotension - Follow blood cultures--neg to date - initially on empiric vancomycin  and cefepime  - Chest x-ray without consolidation - initially on Levophed 9 mcg/kg/min>>> down to 1 mcg/kg/min 6/17 - Continue midodrine TID  Lobar pneumonia -6/16 CT chest--LLL consolidation with small pleural eff -d/c vanc -continue cefepime  -add doxy  Left Breast fluid collection -12/02/23 3.5 x 1.9 cm fluid density lesion associated with biopsy markers; Prominent left breast glandular tissue with underlying mass not excluded -consulted and discussed with Dr. Cherrie Cornwall only aspirate or drain if WBC continues to rise or clinically declines -pt states her breast thickening has not changed--no pain, no drainage; no worsening edema or erythema -pt made aware and to follow up with med onc at [facility] after d/c here -pt w/hx of ER positive stage 1 breast cancer -lumpectomy 12/25/2016 -Cytoxan + Taxotere at [facility] 02/2017 to 05/2017 -Left Breast XRT 06/2017 -med onc at [facility], last visit 03/05/2023 -last mammo 03/05/23--negative for malignancy   Acute metabolic encephalopathy - At baseline, she is A&O x 4 - She presented with lethargy and confusion - Secondary to infectious process - Holding gabapentin and trazodone - Now back to baseline   ESRD - Last HD 6/13 - Nephrology consult appreciated - Patient states that she has been on dialysis since 2008   Chronic respiratory failure with hypoxia - Chronically on 2 L as needed at home - Currently on 2 L>> weaned to room air -Viral respiratory panel--neg   Diabetes  mellitus type 2 with hyperglycemia - 09/23/2023 A1c--6.7 - Resume lower dose Semglee  - NovoLog  sliding scale   Anemia of CKD - Baseline hemoglobin 9-10   Class II obesity - BMI 39.56 - Lifestyle modification   Hx of BKA (HCC) -PT eval   Mixed hyperlipidemia -Continue  statin             Family Communication:   spouse at bedside 6/17   Consultants:  renal   Code Status:  FULL    DVT Prophylaxis:   Heparin       Procedures: As Listed in Progress Note Above   Antibiotics: Vanc 6/14>>6/17 Cefepime  6/14>> -doxy 6/17>>      Subjective: Pt denies any pain or worsen edema, erythema or drainage in left breast.  Denies f/c, cp, n/v/d, abd pain  Objective: Vitals:   12/03/23 1100 12/03/23 1118 12/03/23 1130 12/03/23 1200  BP:  (!) 101/39 (!) 155/49 (!) 132/52  Pulse:  82 87 90  Resp:  18 (!) 6 12  Temp: 98.1 F (36.7 C)     TempSrc:      SpO2:  94% 95% 99%  Weight:      Height:        Intake/Output Summary (Last 24 hours) at 12/03/2023 1649 Last data filed at 12/03/2023 0955 Gross per 24 hour  Intake 359.2 ml  Output 1900 ml  Net -1540.8 ml   Weight change:  Exam:  General:  Pt is alert, follows commands appropriately, not in acute distress HEENT: No icterus, No thrush, No neck mass, [place]/AT Cardiovascular: RRR, S1/S2, no rubs, no gallops Respiratory: bibasilar rales left>R, no wheeze Abdomen: Soft/+BS, non tender, non distended, no guarding Extremities: No edema, No lymphangitis, No petechiae, No rashes, no synovitis Left breast--Cassidy McBride RN present>>+skin thickened, no tenderness, no erythema, no open wound   Data Reviewed: I have personally reviewed following labs and imaging studies Basic Metabolic Panel: Recent Labs  Lab 11/30/23 1514 12/01/23 0210 12/02/23 0414  NA 137 135 137  K 4.2 3.8 4.2  CL 98 101 102  CO2 24 19* 22  GLUCOSE 152* 148* 151*  BUN 34* 36* 46*  CREATININE 6.60* 6.52* 8.47*  CALCIUM  7.0* 6.4* 7.2*  MG 2.1  --   --   PHOS  --   --  6.7*   Liver Function Tests: Recent Labs  Lab 11/30/23 1514 12/01/23 0210 12/02/23 0414  AST 26 34  --   ALT 7 6  --   ALKPHOS 96 87  --   BILITOT 0.7 0.9  --   PROT 7.4 6.7  --   ALBUMIN 3.0* 2.9* 2.6*   No results for input(s): LIPASE,  AMYLASE in the last 168 hours. No results for input(s): AMMONIA in the last 168 hours. Coagulation Profile: Recent Labs  Lab 11/30/23 1514  INR 1.1   CBC: Recent Labs  Lab 11/30/23 1514 12/01/23 0210 12/02/23 0414  WBC 8.3 9.6 10.0  NEUTROABS 6.3  --   --   HGB 8.6* 9.4* 9.4*  HCT 26.2* 29.6* 28.6*  MCV 101.9* 102.8* 101.8*  PLT 144* 145* 147*   Cardiac Enzymes: No results for input(s): CKTOTAL, CKMB, CKMBINDEX, TROPONINI in the last 168 hours. BNP: Invalid input(s): POCBNP CBG: Recent Labs  Lab 11/30/23 1412 11/30/23 2234 12/01/23 0226 12/01/23 2140 12/02/23 2206  GLUCAP 151* 90 134* 165* 99   HbA1C: No results for input(s): HGBA1C in the last 72 hours. Urine analysis: No results found for: COLORURINE, APPEARANCEUR, LABSPEC, PHURINE, GLUCOSEU,  HGBUR, BILIRUBINUR, KETONESUR, PROTEINUR, UROBILINOGEN, NITRITE, LEUKOCYTESUR Sepsis Labs: @LABRCNTIP (procalcitonin:4,lacticidven:4) ) Recent Results (from the past 240 hours)  Blood Culture (routine x 2)     Status: None (Preliminary result)   Collection Time: 11/30/23  3:14 PM   Specimen: Right Antecubital; Blood  Result Value Ref Range Status   Specimen Description   Final    RIGHT ANTECUBITAL BOTTLES DRAWN AEROBIC AND ANAEROBIC   Special Requests Blood Culture adequate volume  Final   Culture   Final    NO GROWTH 3 DAYS Performed at [facility], [address] [place], [place] [address]    Report Status PENDING  Incomplete  Blood Culture (routine x 2)     Status: None (Preliminary result)   Collection Time: 11/30/23  3:14 PM   Specimen: BLOOD RIGHT HAND  Result Value Ref Range Status   Specimen Description   Final    BLOOD RIGHT HAND BOTTLES DRAWN AEROBIC AND ANAEROBIC   Special Requests Blood Culture adequate volume  Final   Culture   Final    NO GROWTH 3 DAYS Performed at [facility], [address] [place], [place] [address]    Report Status PENDING  Incomplete   MRSA Next Gen by PCR, Nasal     Status: None   Collection Time: 11/30/23  9:18 PM   Specimen: Nasal Mucosa; Nasal Swab  Result Value Ref Range Status   MRSA by PCR Next Gen NOT DETECTED NOT DETECTED Final    Comment: (NOTE) The GeneXpert MRSA Assay (FDA approved for NASAL specimens only), is one component of a comprehensive MRSA colonization surveillance program. It is not intended to diagnose MRSA infection nor to guide or monitor treatment for MRSA infections. Test performance is not FDA approved in patients less than 66 years old. Performed at [facility], [address] [place], [place] [address]   Respiratory (~20 pathogens) panel by PCR     Status: None   Collection Time: 12/02/23  1:17 PM   Specimen: Nasopharyngeal Swab; Respiratory  Result Value Ref Range Status   Adenovirus NOT DETECTED NOT DETECTED Final   Coronavirus 229E NOT DETECTED NOT DETECTED Final    Comment: (NOTE) The Coronavirus on the Respiratory Panel, DOES NOT test for the novel  Coronavirus (2019 nCoV)    Coronavirus HKU1 NOT DETECTED NOT DETECTED Final   Coronavirus NL63 NOT DETECTED NOT DETECTED Final   Coronavirus OC43 NOT DETECTED NOT DETECTED Final   Metapneumovirus NOT DETECTED NOT DETECTED Final   Rhinovirus / Enterovirus NOT DETECTED NOT DETECTED Final   Influenza A NOT DETECTED NOT DETECTED Final   Influenza B NOT DETECTED NOT DETECTED Final   Parainfluenza Virus 1 NOT DETECTED NOT DETECTED Final   Parainfluenza Virus 2 NOT DETECTED NOT DETECTED Final   Parainfluenza Virus 3 NOT DETECTED NOT DETECTED Final   Parainfluenza Virus 4 NOT DETECTED NOT DETECTED Final   Respiratory Syncytial Virus NOT DETECTED NOT DETECTED Final   Bordetella pertussis NOT DETECTED NOT DETECTED Final   Bordetella Parapertussis NOT DETECTED NOT DETECTED Final   Chlamydophila pneumoniae NOT DETECTED NOT DETECTED Final   Mycoplasma pneumoniae NOT DETECTED NOT DETECTED Final    Comment: Performed at [facility], [address] [place], [place] [address]     Scheduled Meds:  aspirin  EC  81 mg Oral q morning   atorvastatin  40 mg Oral Daily   calcitRIOL  0.5 mcg Oral BID   calcium  acetate  667 mg Oral TID with meals  Chlorhexidine Gluconate Cloth  6 each Topical Daily   Chlorhexidine Gluconate Cloth  6 each Topical Q0600   DULoxetine  20 mg Oral Daily   famotidine   20 mg Oral QHS   heparin   5,000 Units Subcutaneous Q8H   insulin  glargine-yfgn  15 Units Subcutaneous QHS   midodrine  10 mg Oral TID WC   multivitamin  1 tablet Oral QHS   Continuous Infusions:  ceFEPime  (MAXIPIME ) IV 200 mL/hr at 12/03/23 0955   norepinephrine (LEVOPHED) Adult infusion 1 mcg/min (12/03/23 0955)   vancomycin  200 mL/hr at 12/03/23 5409    Procedures/Studies: CT CHEST WO CONTRAST Result Date: 12/02/2023 CLINICAL DATA:  Dyspnea, chronic, unclear etiology Respiratory illness, nondiagnostic xray cough, chest congestion, sepsis EXAM: CT CHEST WITHOUT CONTRAST TECHNIQUE: Multidetector CT imaging of the chest was performed following the standard protocol without IV contrast. RADIATION DOSE REDUCTION: This exam was performed according to the departmental dose-optimization program which includes automated exposure control, adjustment of the mA and/or kV according to patient size and/or use of iterative reconstruction technique. COMPARISON:  Chest x-ray 11/30/2023 CT angio chest 02/27/2014 FINDINGS: Cardiovascular: Normal heart size. Question thickened pericardium with no definite pericardial effusion. The thoracic aorta is normal in caliber. No atherosclerotic plaque of the thoracic aorta. Four-vessel coronary artery calcifications. Mediastinum/Nodes: No enlarged mediastinal or axillary lymph nodes. Thyroid gland, trachea, demonstrate no significant findings. Patulous esophagus. Lungs/Pleura: Trace left pleural effusion with left lower lobe consolidation. Lingular atelectasis. No pulmonary nodule. No pulmonary mass. No right  pleural effusion. No pneumothorax. Upper Abdomen: Colonic diverticulosis. Musculoskeletal: A 3.5 x 1.9 cm fluid density lesion within the left breast with associated adjacent biopsy markers. Left axillary stent graft. Prominent left breast glandular tissue with underlying mass not excluded. Left breast dermal thickening. Left breast soft tissue edema. Right chest wall Port-A-Cath with tip terminating at the superior cavoatrial junction. Venous collaterals along the chest. No suspicious lytic or blastic osseous lesions. No acute displaced fracture. IMPRESSION: 1. Trace left pleural effusion with left lower lobe consolidation. Consolidation could represent a combination of atelectasis versus infection/inflammation. Limited evaluation on this noncontrast study. Recommend repeat CT in 3 months to evaluate for complete resolution. 2. Left breast dermal thickening and soft tissue edema. Prominent left breast glandular tissue with underlying mass not excluded. A 3.5 x 1.9 cm fluid density lesion within the left breast with associated adjacent biopsy markers. Correlate with mammography. 3. Question thickened pericardium with no definite pericardial effusion. 4. Aortic Atherosclerosis (ICD10-I70.0) including four-vessel coronary calcifications. Electronically Signed   By: Morgane  Naveau M.D.   On: 12/02/2023 19:36   DG Chest Port 1 View Result Date: 11/30/2023 CLINICAL DATA:  Possible sepsis. Recent dialysis site procedure 11/28/2023. EXAM: PORTABLE CHEST 1 VIEW COMPARISON:  03/01/2014 FINDINGS: Lordotic technique is demonstrated. Right IJ Port-A-Cath with tip over the SVC. Lungs are adequately inflated without focal airspace consolidation or effusion. Possible minimal prominence of the perihilar vessels suggesting mild vascular congestion versus secondary to the degree of hypoinflation. Cardiomediastinal silhouette is unremarkable. Vascular stent over the left axillary region. Remainder of the exam is unchanged.  IMPRESSION: 1. No focal airspace consolidation. 2. Possible minimal prominence of the perihilar vessels suggesting mild vascular congestion versus secondary to the degree of hypoinflation. Electronically Signed   By: Roda Cirri M.D.   On: 11/30/2023 16:10   CT Head Wo Contrast Result Date: 11/30/2023 CLINICAL DATA:  Altered mental status.  Weakness. EXAM: CT HEAD WITHOUT CONTRAST TECHNIQUE: Contiguous axial images were obtained from the base of  the skull through the vertex without intravenous contrast. RADIATION DOSE REDUCTION: This exam was performed according to the departmental dose-optimization program which includes automated exposure control, adjustment of the mA and/or kV according to patient size and/or use of iterative reconstruction technique. COMPARISON:  03/01/2014 FINDINGS: Brain: No evidence of intracranial hemorrhage, acute infarction, hydrocephalus, extra-axial collection, or mass lesion/mass effect. Mild diffuse cerebellar atrophy noted. Vascular:  No hyperdense vessel or other acute findings. Skull: No evidence of fracture or other significant bone abnormality. Sinuses/Orbits:  No acute findings. Other: None. IMPRESSION: No acute intracranial abnormality. Mild cerebellar atrophy. Electronically Signed   By: Marlyce Sine M.D.   On: 11/30/2023 16:09    Demaris Fillers, DO  [facility]  If 7PM-7AM, please contact night-coverage [email or web address] Password [email or web address] 12/03/2023, 4:49 PM   LOS: 3 days

## 2023-12-03 NOTE — Progress Notes (Signed)
 Pharmacy Antibiotic Note  Kayla Gibson is a 63 y.o. female admitted on 11/30/2023 with sepsis. PMH significant for ESRD on HD MWF, HTN, and T2DM. Patient presenting with altered mental status and hypotension. In ED, patient is afebrile with WBC 8.3. Pharmacy has been consulted for cefepime  and vancomycin  dosing.  Plan: Continue cefepime  2000 mg IV every MWF w/ HD Continue vancomycin  1000 mg IV every MWF with HD Monitor clinical status, culture data, and LOT F/u nephro plans for HD while admitted  Temp (24hrs), Avg:98.4 F (36.9 C), Min:97.9 F (36.6 C), Max:99.2 F (37.3 C)  Recent Labs  Lab 11/30/23 1514 11/30/23 1655 12/01/23 0210 12/02/23 0414  WBC 8.3  --  9.6 10.0  CREATININE 6.60*  --  6.52* 8.47*  LATICACIDVEN 1.6 1.4  --   --     Estimated Creatinine Clearance: 7.7 mL/min (A) (by C-G formula based on SCr of 8.47 mg/dL (H)).    Allergies  Allergen Reactions   Ace Inhibitors Shortness Of Breath   Hydrocodone Anaphylaxis and Other (See Comments)    Stopped me from breathing   Hydrocodone-Acetaminophen Anaphylaxis   Hydromorphone  Hcl Anaphylaxis, Shortness Of Breath and Other (See Comments)    Stopped me from breathing Hallucinations   Hydromorphone  Hcl Anaphylaxis    Tolerated oxycodone  x 2 doses previously   Iodine Anaphylaxis   Tylenol [Acetaminophen] Anaphylaxis   Zolpidem Anaphylaxis and Other (See Comments)    Hallucinations   Alprazolam Other (See Comments)    Hallucination   Zolpidem Tartrate Other (See Comments)    Hallucinations   Bee Venom Swelling   Povidone-Iodine Hives and Itching    04/19/2021 pt is not allergic to IV contrast    Antimicrobials this admission: cefepime  6/14 >>  vancomycin  6/14 >>     Microbiology results: 6/14 BCx: ngtd Respiratory panel: neg MRSA PCR: neg  Thank you for involving pharmacy in this patient's care.   Cliffton Dama, PharmD Clinical Pharmacist 12/03/2023 10:34 AM

## 2023-12-03 NOTE — Consult Note (Signed)
 Reason for Consult: Abnormal left breast Referring Physician: Dr. Winferd Hatter  Kayla Gibson is an 63 y.o. female.  HPI: Patient is a 63 year old black female with multiple medical problems including end-stage renal disease on hemodialysis, peripheral vascular disease, below the knee amputation, type 2 diabetes, and hypertension who presented to the emergency room with altered mental status secondary to sepsis.  Patient is being treated for pneumonia.  A CT scan of the chest was performed and multiple clips and a small fluid collection was noted in the left breast.  In further research, patient has a known left breast cancer, ER positive and status post left partial mastectomy with sentinel lymph node biopsy and postoperative radiation therapy.  This all started in 2018.  She was last seen by oncology at [facility] in 2024 and has a follow-up appointment to see them in September of 2025.  Patient states that her left breast has always been smaller than her right breast and the thickened skin is secondary to her radiation therapy.  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 She has never used smokeless tobacco. She reports that she does not drink alcohol and does not use drugs.  Allergies:   Allergies  Allergen Reactions   Ace Inhibitors Shortness Of Breath   Hydrocodone Anaphylaxis and Other (See Comments)    Stopped me from breathing   Hydrocodone-Acetaminophen Anaphylaxis   Hydromorphone  Hcl Anaphylaxis, Shortness Of Breath and Other (See Comments)    Stopped me from breathing Hallucinations   Hydromorphone  Hcl Anaphylaxis    Tolerated oxycodone  x 2 doses previously   Iodine Anaphylaxis   Tylenol [Acetaminophen] Anaphylaxis   Zolpidem Anaphylaxis and Other (See Comments)    Hallucinations   Alprazolam Other (See Comments)    Hallucination   Zolpidem Tartrate Other (See Comments)    Hallucinations   Bee Venom Swelling   Povidone-Iodine Hives and Itching    04/19/2021 pt is not allergic to IV contrast    Medications: I have reviewed the patient's current medications. Prior to Admission:  Medications Prior to Admission  Medication Sig Dispense Refill Last Dose/Taking   aspirin  EC 81 MG tablet Take 81 mg by mouth every morning.    Past Week   atorvastatin (LIPITOR) 40 MG tablet Take 40 mg by mouth daily.   11/28/2023   calcitRIOL (ROCALTROL) 0.5 MCG capsule Take 0.5 mcg by mouth 2 (two) times daily.   11/28/2023   calcium  acetate (PHOSLO ) 667 MG capsule Take 667 mg by mouth 3 (three) times daily.     11/29/2023 Bedtime  cinacalcet  (SENSIPAR ) 30 MG tablet Take 30 mg by mouth every evening.     11/28/2023   CYMBALTA 20 MG capsule Take 20 mg by mouth daily.   11/28/2023   dorzolamide-timolol (COSOPT) 2-0.5 % ophthalmic solution Place 1 drop into the right eye 2 (two) times daily.   Past Week   famotidine  (PEPCID ) 20 MG tablet Take 20 mg by mouth 2 (two) times daily.   11/29/2023 Bedtime   fenofibrate  (TRICOR ) 48 MG tablet Take 48 mg by mouth daily.   11/28/2023   gabapentin (NEURONTIN) 300 MG capsule Take 300 mg by mouth 2 (two) times daily.   11/28/2023   HUMALOG KWIKPEN 100 UNIT/ML KwikPen Inject 18 Units into the skin 3 (three) times daily.   11/29/2023 Evening    isosorbide  mononitrate (IMDUR ) 30 MG 24 hr tablet Take 30 mg by mouth daily.   11/28/2023   LANTUS  SOLOSTAR 100 UNIT/ML Solostar Pen Inject 38 Units into the skin at bedtime.   11/29/2023 Bedtime   midodrine (PROAMATINE) 10 MG tablet Take 10 mg by mouth 3 (three) times daily as needed (hypotension).   11/29/2023 Bedtime   Multiple Vitamins-Minerals (RENAPLEX-D) TABS Take 1 tablet by mouth every evening.   11/28/2023   ondansetron  (ZOFRAN -ODT) 4 MG disintegrating tablet Take 8 mg by mouth every 8 (eight) hours as needed for nausea or vomiting.   11/29/2023 Bedtime   OZEMPIC, 0.25 OR 0.5 MG/DOSE, 2 MG/3ML SOPN Inject 0.25 mg into the skin every Thursday.   11/28/2023   sucroferric oxyhydroxide (VELPHORO) 500 MG chewable tablet Chew 500 mg by mouth 3 (three) times daily with meals.   11/29/2023 Bedtime   traMADol  (ULTRAM ) 50 MG tablet Take 50 mg by mouth daily as needed. For back  pain    Past Week   traZODone (DESYREL) 150 MG tablet Take 150 mg by mouth daily.   11/29/2023 Bedtime    Results for orders placed or performed during the hospital encounter of 11/30/23 (from the past 48 hours)  Glucose, capillary     Status: Abnormal   Collection Time: 12/01/23  9:40 PM  Result Value Ref Range   Glucose-Capillary 165 (H) 70 - 99 mg/dL    Comment: Glucose reference range applies only to samples taken after fasting for at least 8 hours.  Renal function panel     Status: Abnormal   Collection Time: 12/02/23  4:14 AM  Result Value Ref Range   Sodium 137 135 - 145 mmol/L   Potassium 4.2 3.5 - 5.1 mmol/L   Chloride 102 98 - 111 mmol/L   CO2 22 22 - 32 mmol/L   Glucose, Bld 151 (H) 70 - 99 mg/dL    Comment: Glucose reference range applies only to samples taken after fasting for at least 8 hours.   BUN 46 (H) 6 - 20 mg/dL   Creatinine, Ser 1.61 (H) 0.44 - 1.00 mg/dL   Calcium  7.2 (L) 8.9 - 10.3 mg/dL   Phosphorus 6.7 (H) 2.5 - 4.6 mg/dL   Albumin 2.6 (L) 3.5 - 5.0 g/dL   GFR, Estimated 5 (L) >60 mL/min     Comment: (NOTE) Calculated using the CKD-EPI Creatinine Equation (2021)    Anion gap 13 5 - 15    Comment: Performed at [facility], [address] [place], [place] [address]  CBC     Status: Abnormal   Collection Time: 12/02/23  4:14 AM  Result Value Ref Range   WBC 10.0 4.0 - 10.5 K/uL   RBC  2.81 (L) 3.87 - 5.11 MIL/uL   Hemoglobin 9.4 (L) 12.0 - 15.0 g/dL   HCT 16.1 (L) 09.6 - 04.5 %   MCV 101.8 (H) 80.0 - 100.0 fL   MCH 33.5 26.0 - 34.0 pg   MCHC 32.9 30.0 - 36.0 g/dL   RDW 40.9 81.1 - 91.4 %   Platelets 147 (L) 150 - 400 K/uL   nRBC 0.0 0.0 - 0.2 %    Comment: Performed at [facility], [address] [place], [place] [address]  Hepatitis B surface antigen     Status: None   Collection Time: 12/02/23  4:14 AM  Result Value Ref Range   Hepatitis B Surface Ag NON REACTIVE NON REACTIVE    Comment: Performed at [facility], [address], [place], [place] [address]  Hepatitis B surface antibody,quantitative     Status: None   Collection Time: 12/02/23  4:14 AM  Result Value Ref Range   Hep B S AB Quant (Post) 136.0 Immunity>10 mIU/mL    Comment: (NOTE)  Status of Immunity                     Anti-HBs Level  ------------------                     -------------- Inconsistent with Immunity                  0.0 - 10.0 Consistent with Immunity                         >10.0 Performed At: [facility] [address] [place], [place] [id number] Pearlean Botts MD EX:[id number]   Respiratory (~20 pathogens) panel by PCR     Status: None   Collection Time: 12/02/23  1:17 PM   Specimen: Nasopharyngeal Swab; Respiratory  Result Value Ref Range   Adenovirus NOT DETECTED NOT DETECTED   Coronavirus 229E NOT DETECTED NOT DETECTED    Comment: (NOTE) The Coronavirus on the Respiratory Panel, DOES NOT test for the novel  Coronavirus (2019 nCoV)    Coronavirus HKU1 NOT DETECTED NOT DETECTED   Coronavirus NL63 NOT DETECTED NOT DETECTED   Coronavirus OC43 NOT DETECTED NOT  DETECTED   Metapneumovirus NOT DETECTED NOT DETECTED   Rhinovirus / Enterovirus NOT DETECTED NOT DETECTED   Influenza A NOT DETECTED NOT DETECTED   Influenza B NOT DETECTED NOT DETECTED   Parainfluenza Virus 1 NOT DETECTED NOT DETECTED   Parainfluenza Virus 2 NOT DETECTED NOT DETECTED   Parainfluenza Virus 3 NOT DETECTED NOT DETECTED   Parainfluenza Virus 4 NOT DETECTED NOT DETECTED   Respiratory Syncytial Virus NOT DETECTED NOT DETECTED   Bordetella pertussis NOT DETECTED NOT DETECTED   Bordetella Parapertussis NOT DETECTED NOT DETECTED   Chlamydophila pneumoniae NOT DETECTED NOT DETECTED   Mycoplasma pneumoniae NOT DETECTED NOT DETECTED    Comment: Performed at [facility], [address] [place], [place] [address]  Glucose, capillary     Status: None   Collection Time: 12/02/23 10:06 PM  Result Value Ref Range   Glucose-Capillary 99 70 - 99 mg/dL    Comment: Glucose reference range applies only to samples taken after fasting for at least 8 hours.   Comment 1 Notify RN    Comment 2 Document in Chart     CT CHEST WO CONTRAST Result Date: 12/02/2023 CLINICAL DATA:  Dyspnea, chronic, unclear etiology Respiratory illness, nondiagnostic xray  cough, chest congestion, sepsis EXAM: CT CHEST WITHOUT CONTRAST TECHNIQUE: Multidetector CT imaging of the chest was performed following the standard protocol without IV contrast. RADIATION DOSE REDUCTION: This exam was performed according to the departmental dose-optimization program which includes automated exposure control, adjustment of the mA and/or kV according to patient size and/or use of iterative reconstruction technique. COMPARISON:  Chest x-ray 11/30/2023 CT angio chest 02/27/2014 FINDINGS: Cardiovascular: Normal heart size. Question thickened pericardium with no definite pericardial effusion. The thoracic aorta is normal in caliber. No atherosclerotic plaque of the thoracic aorta. Four-vessel coronary artery calcifications.  Mediastinum/Nodes: No enlarged mediastinal or axillary lymph nodes. Thyroid gland, trachea, demonstrate no significant findings. Patulous esophagus. Lungs/Pleura: Trace left pleural effusion with left lower lobe consolidation. Lingular atelectasis. No pulmonary nodule. No pulmonary mass. No right pleural effusion. No pneumothorax. Upper Abdomen: Colonic diverticulosis. Musculoskeletal: A 3.5 x 1.9 cm fluid density lesion within the left breast with associated adjacent biopsy markers. Left axillary stent graft. Prominent left breast glandular tissue with underlying mass not excluded. Left breast dermal thickening. Left breast soft tissue edema. Right chest wall Port-A-Cath with tip terminating at the superior cavoatrial junction. Venous collaterals along the chest. No suspicious lytic or blastic osseous lesions. No acute displaced fracture. IMPRESSION: 1. Trace left pleural effusion with left lower lobe consolidation. Consolidation could represent a combination of atelectasis versus infection/inflammation. Limited evaluation on this noncontrast study. Recommend repeat CT in 3 months to evaluate for complete resolution. 2. Left breast dermal thickening and soft tissue edema. Prominent left breast glandular tissue with underlying mass not excluded. A 3.5 x 1.9 cm fluid density lesion within the left breast with associated adjacent biopsy markers. Correlate with mammography. 3. Question thickened pericardium with no definite pericardial effusion. 4. Aortic Atherosclerosis (ICD10-I70.0) including four-vessel coronary calcifications. Electronically Signed   By: Morgane  Naveau M.D.   On: 12/02/2023 19:36    ROS:  Pertinent items are noted in HPI.  Blood pressure (!) 155/49, pulse 87, temperature 98.1 F (36.7 C), resp. rate (!) 6, height 5' 2 (1.575 m), weight 98.1 kg, SpO2 95%. Physical Exam: Pleasant black female in no acute distress.  She is sitting in the chair in the ICU. Head is normocephalic,  atraumatic Breast: Left breast examination reveals thickened skin with healed surgical scar present.  The left breast is smaller than the right breast.  No axillary lymphadenopathy is noted.  It was difficult to appreciate the fluid collection seen on the CT scan.  Right breast examination unremarkable.  CT scan images personally reviewed  Assessment/Plan: Impression: History of left breast cancer, status post partial mastectomy with postoperative radiation therapy.  Patient denies any new complaints of the left breast.  She is scheduled for follow-up in September 2025 at [facility] oncology.  I doubt the fluid collection seen on CT scan needs to be addressed at the present time.  Should the patient not respond appropriately to antibiotics, an ultrasound-guided left breast aspiration could be performed.  Discussed with Dr. Winferd Hatter.  Will follow with you.  Alanda Allegra 12/03/2023, 12:17 PM

## 2023-12-03 NOTE — Progress Notes (Signed)
 Patient ID: Kayla Gibson, female   DOB: 07-08-1960, 63 y.o.   MRN: [id number] [facility] Progress Note    Assessment/ Plan:    HD [facility]  1.  Shock/hypotension with metabolic encephalopathy: Unclear source.  On broad-spectrum antimicrobial therapy with vancomycin  and cefepime . Not likely from the surgery or fistula; I spoke with the surgeon and no e/o infection. 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 2. ESRD: She is typically on Monday/Wednesday/Friday schedule for hemodialysis and  tolerated dialysis on Monday 1.9 L ultrafiltration, blood pressure post treatment was 104/50. Still on low dose Levophed.   On Midodrine dialysis days because of chronic hypotension  Planning on next HD Wed per MWF regimen. I tried calling her center for orders but they would not give orders over the phone, attempted fax failed.  3. Anemia: Without overt blood loss.  Hemoglobin hematocrit remained stable and we will resume ESA if remains an inpatient on Wednesday. 4. CKD-MBD: Elevated phosphorus noted around calcium  level that is low- Continue calcium  acetate 3 times daily AC for phosphorus binding/hypocalcemia. 5. Nutrition: On renal diet, will continue to follow.  Subjective:   Reports breathing more comfortable after dialysis Monday; denies any chest pain or shortness of breath.  Spouse bedside updated  Objective:   BP 105/84   Pulse 90   Temp 98.4 F (36.9 C)   Resp 13   Ht 5' 2 (1.575 m)   Wt 98.1 kg   SpO2 94%   BMI 39.56 kg/m   Physical Exam: Gen: Comfortably propped up in bed, eating breakfast CVS: Pulse regular rhythm, normal rate, S1 and S2 normal Resp: Clear to auscultation, no rales/rhonchi Abd: Soft, obese, bowel sounds normal Ext: Status post left leg TMA, right leg BKA.  Left forearm AV graft with clean gauze dressing wrapped, staples in place, +bruit present  Labs: BMET Recent Labs  Lab  11/30/23 1514 12/01/23 0210 12/02/23 0414  NA 137 135 137  K 4.2 3.8 4.2  CL 98 101 102  CO2 24 19* 22  GLUCOSE 152* 148* 151*  BUN 34* 36* 46*  CREATININE 6.60* 6.52* 8.47*  CALCIUM  7.0* 6.4* 7.2*  PHOS  --   --  6.7*   CBC Recent Labs  Lab 11/30/23 1514 12/01/23 0210 12/02/23 0414  WBC 8.3 9.6 10.0  NEUTROABS 6.3  --   --   HGB 8.6* 9.4* 9.4*  HCT 26.2* 29.6* 28.6*  MCV 101.9* 102.8* 101.8*  PLT 144* 145* 147*      Medications:     aspirin  EC  81 mg Oral q morning   atorvastatin  40 mg Oral Daily   calcitRIOL  0.5 mcg Oral BID   calcium  acetate  667 mg Oral TID with meals   Chlorhexidine Gluconate Cloth  6 each Topical Daily   Chlorhexidine Gluconate Cloth  6 each Topical Q0600   DULoxetine  20 mg Oral Daily   famotidine   20 mg Oral QHS   heparin   5,000 Units Subcutaneous Q8H   insulin  glargine-yfgn  15 Units Subcutaneous QHS   midodrine  10 mg Oral TID WC   multivitamin  1 tablet Oral QHS

## 2023-12-04 DIAGNOSIS — N186 End stage renal disease: Secondary | ICD-10-CM | POA: Diagnosis not present

## 2023-12-04 DIAGNOSIS — J181 Lobar pneumonia, unspecified organism: Secondary | ICD-10-CM | POA: Diagnosis not present

## 2023-12-04 DIAGNOSIS — Z89512 Acquired absence of left leg below knee: Secondary | ICD-10-CM

## 2023-12-04 DIAGNOSIS — E1122 Type 2 diabetes mellitus with diabetic chronic kidney disease: Secondary | ICD-10-CM

## 2023-12-04 DIAGNOSIS — Z853 Personal history of malignant neoplasm of breast: Secondary | ICD-10-CM

## 2023-12-04 DIAGNOSIS — Z992 Dependence on renal dialysis: Secondary | ICD-10-CM

## 2023-12-04 DIAGNOSIS — I959 Hypotension, unspecified: Secondary | ICD-10-CM | POA: Diagnosis not present

## 2023-12-04 LAB — CBC
HCT: 25.8 % — ABNORMAL LOW (ref 36.0–46.0)
Hemoglobin: 8.6 g/dL — ABNORMAL LOW (ref 12.0–15.0)
MCH: 33.1 pg (ref 26.0–34.0)
MCHC: 33.3 g/dL (ref 30.0–36.0)
MCV: 99.2 fL (ref 80.0–100.0)
Platelets: 153 10*3/uL (ref 150–400)
RBC: 2.6 MIL/uL — ABNORMAL LOW (ref 3.87–5.11)
RDW: 14.3 % (ref 11.5–15.5)
WBC: 6.9 10*3/uL (ref 4.0–10.5)
nRBC: 0 % (ref 0.0–0.2)

## 2023-12-04 MED ORDER — HYDROXYZINE HCL 25 MG PO TABS: 25.0000 mg | ORAL_TABLET | Freq: Three times a day (TID) | ORAL | Status: DC | PRN

## 2023-12-04 MED ORDER — AMOXICILLIN-POT CLAVULANATE 875-125 MG PO TABS: 1.0000 | ORAL_TABLET | Freq: Two times a day (BID) | ORAL | Status: DC

## 2023-12-04 MED ORDER — AMOXICILLIN-POT CLAVULANATE 500-125 MG PO TABS
1.0000 | ORAL_TABLET | Freq: Every day | ORAL | Status: DC
  Administered 2023-12-04: 1 via ORAL
  Filled 2023-12-04 (×2): qty 1

## 2023-12-04 MED ORDER — MIDODRINE HCL 5 MG PO TABS
ORAL_TABLET | ORAL | Status: AC
  Filled 2023-12-04: qty 2

## 2023-12-04 MED ORDER — DARBEPOETIN ALFA 100 MCG/0.5ML IJ SOSY
100.0000 ug | PREFILLED_SYRINGE | INTRAMUSCULAR | Status: DC
  Administered 2023-12-04: 100 ug via SUBCUTANEOUS
  Filled 2023-12-04 (×2): qty 0.5

## 2023-12-04 MED ORDER — GUAIFENESIN-DM 100-10 MG/5ML PO SYRP
5.0000 mL | ORAL_SOLUTION | ORAL | Status: DC | PRN
  Administered 2023-12-04 (×2): 5 mL via ORAL
  Filled 2023-12-04 (×2): qty 5

## 2023-12-04 NOTE — Assessment & Plan Note (Addendum)
 Patient was placed on insulin  sliding scale for glucose cover and monitoring.  Continue basal insulin  with close follow up on capillary glucose as outpatient.  Resume home dose of basal insulin  and pre meal short acting.  GLP 1 agonist.  Glucose remained controlled.  Patient is tolerating po well.  Continue statin therapy.

## 2023-12-04 NOTE — Progress Notes (Addendum)
 Patient ID: Kayla Gibson, female   DOB: July 11, 1960, 63 y.o.   MRN: [id number] [facility] Progress Note   Assessment/ Plan:   1.  Shock/hypotension with metabolic encephalopathy: Unclear source.  On broad-spectrum antimicrobial therapy with doxycycline and cefepime .  Blood culture negative to date with no identifiable respiratory pathogen. 2. ESRD: She is typically on Monday/Wednesday/Friday schedule for hemodialysis and is scheduled for dialysis today.  Pressors have been weaned off and she is on midodrine; will monitor blood pressures with hemodialysis/ultrafiltration. 3. Anemia: Without overt blood loss.  Hemoglobin and hematocrit trending down without overt blood loss, will order for ESA today.   4. CKD-MBD: Elevated phosphorus noted around calcium  level that is low- Continue calcium  acetate 3 times daily AC for phosphorus binding/hypocalcemia. 5. Nutrition: On renal diet, will continue to follow.  Subjective:   Complains of poor sleep overnight and itching in her right hand.   Objective:   BP (!) 122/56   Pulse 91   Temp 98 F (36.7 C) (Oral)   Resp 18   Ht 5' 2 (1.575 m)   Wt 99.2 kg   SpO2 (!) 52%   BMI 40.00 kg/m   Physical Exam: Gen: She appears uncomfortable resting in bed CVS: Pulse regular rhythm, normal rate, S1 and S2 normal Resp: Clear to auscultation, no rales/rhonchi Abd: Soft, obese, bowel sounds normal Ext: Status post left leg TMA, right leg BKA.  Left forearm AV graft with clean gauze dressing wrapped  Labs: BMET Recent Labs  Lab 11/30/23 1514 12/01/23 0210 12/02/23 0414  NA 137 135 137  K 4.2 3.8 4.2  CL 98 101 102  CO2 24 19* 22  GLUCOSE 152* 148* 151*  BUN 34* 36* 46*  CREATININE 6.60* 6.52* 8.47*  CALCIUM  7.0* 6.4* 7.2*  PHOS  --   --  6.7*   CBC Recent Labs  Lab 11/30/23 1514 12/01/23 0210 12/02/23 0414 12/04/23 0527  WBC 8.3 9.6 10.0 6.9  NEUTROABS 6.3  --   --   --   HGB 8.6* 9.4* 9.4* 8.6*  HCT 26.2* 29.6*  28.6* 25.8*  MCV 101.9* 102.8* 101.8* 99.2  PLT 144* 145* 147* 153      Medications:     aspirin  EC  81 mg Oral q morning   atorvastatin  40 mg Oral Daily   calcitRIOL  0.5 mcg Oral BID   calcium  acetate  667 mg Oral TID with meals   Chlorhexidine Gluconate Cloth  6 each Topical Daily   Chlorhexidine Gluconate Cloth  6 each Topical Q0600   doxycycline  100 mg Oral Q12H   DULoxetine  20 mg Oral Daily   famotidine   20 mg Oral QHS   heparin   5,000 Units Subcutaneous Q8H   insulin  glargine-yfgn  15 Units Subcutaneous QHS   midodrine  10 mg Oral TID WC   multivitamin  1 tablet Oral QHS   Clevester Dally, MD 12/04/2023, 8:02 AM

## 2023-12-04 NOTE — Assessment & Plan Note (Addendum)
 Resolved septic shock (present on admission)  Patient as been on antibiotic therapy with cefepime , and then transition to po Augmentin  with good toleration.  Patient has been afebrile and cultures have been no growth.   Follow up left breast fluid collection, conservative for now per surgery recommendations.   Acute metabolic encephalopathy, likely multifactorial due to infection and hypotension, currently she is back to her baseline Encephalopathy was resolved.

## 2023-12-04 NOTE — Progress Notes (Addendum)
 Pt had cramping at last 5 mins. Pt goal met. 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  12/04/23 1505  Vitals  Temp 98.6 F (37 C)  BP 120/70  BP Location Right Wrist  BP Method Automatic  Patient Position (if appropriate) Lying  Pulse Rate 84  Resp 10  Oxygen Therapy  SpO2 99 %  O2 Device Room Air  During Treatment Monitoring  Intra-Hemodialysis Comments Tx completed  Post Treatment  Dialyzer Clearance Lightly streaked  Hemodialysis Intake (mL) 200 mL  Liters Processed 68  Fluid Removed (mL) 2800 mL  Tolerated HD Treatment Yes  Post-Hemodialysis Comments Pt goal met.  AVG/AVF Arterial Site Held (minutes) 10 minutes  AVG/AVF Venous Site Held (minutes) 10 minutes  Vascular Access Left Forearm Arteriovenous vein graft  No Placement Date or Time found.   Orientation: Left  Access Location: Forearm  Access Type: (c) Arteriovenous vein graft  Site Condition No complications  Fistula / Graft Assessment Present;Thrill;Bruit  Status Deaccessed  Needle Size 15  Drainage Description None

## 2023-12-04 NOTE — Plan of Care (Signed)
   Problem: Education: Goal: Knowledge of General Education information will improve Description Including pain rating scale, medication(s)/side effects and non-pharmacologic comfort measures Outcome: Progressing   Problem: Education: Goal: Knowledge of General Education information will improve Description Including pain rating scale, medication(s)/side effects and non-pharmacologic comfort measures Outcome: Progressing

## 2023-12-04 NOTE — Plan of Care (Signed)

## 2023-12-04 NOTE — Assessment & Plan Note (Signed)
 Follow up as outpatient.

## 2023-12-04 NOTE — Assessment & Plan Note (Deleted)
 Resolved septic shock, possible left lower lobe pneumonia.   Plan to continue antibiotic therapy with cefepime  Follow up cell count and temperature curve.  Her cultures have been no growth.  Follow up left breast fluid collection, conservative for now per surgery recommendations.

## 2023-12-04 NOTE — Assessment & Plan Note (Addendum)
 Tolerated well hemodialysis, plan to continue midodrine for blood pressure support.  Access left forearm graft.   Anemia of chronic renal diease, continue EPO oral iron supplementation   Metabolic bone disease, continue with sensipar , calcitriol and phoslo .

## 2023-12-04 NOTE — Assessment & Plan Note (Addendum)
 Continue blood pressure support with midodrine   Will hold on isosorbide  for now.

## 2023-12-04 NOTE — Progress Notes (Addendum)
 Progress Note   Patient: Kayla Gibson GMW:[id number] DOB: 1961/04/24 DOA: 11/30/2023     4 DOS: the patient was seen and examined on 12/04/2023   Brief hospital course: Kayla Gibson was admitted to the hospital with the working diagnosis of sepsis   63 year old female with a history of ESRD (MWF) chronic hypotension on midodrine on dialysis days, diabetes mellitus type 2, peripheral vascular disease status post left BKA, hypertension, gastroparesis, depression presented with altered mental status.  The patient went to [facility] on 11/28/2023 for difficulties with her AV graft.  Apparently the patient underwent fistulogram and some other procedure.  Patient was kept overnight secondary to some low blood pressures.  She was dialyzed on 11/29/2023 through the fistula.   She was subsequently discharged home.   11/30/2023 around 1 AM, the patient appeared lethargic.  She was taken to [facility] in [place].  She was noted to have low blood pressures in the ED.  She was given IV fluids in the ED.  Mental status improved and patient was discharged home.  Later in the evening on 11/30/2023, the patient was once again slow to respond.  As result, the patient was brought to [facility] for further evaluation.  On her initial physical examination her  temp was 99.8, initially hypoxic, became hypotensive in the ER with maps as low as 50, slowly improving, starting on a fluid bolus and antibiotics.   Chest radiograph with hypoinflation, no cardiomegaly, increased lung marking bilaterally.   EKG 87 bpm, normal axis, qtc 519, sinus rhythm with left atrial enlargement and 1st degree AV block, no significant ST segment or T wave changes. Low voltage.   CT chest with trace left pleural effusion with left lower lobe consolidation. Left breast dermal thickening and soft tissue edema. Prominent left breast glandular tissue underlying mass not excluded. A 3,5 x 1,9 cm fluid density lesion within the left breast with  associated adjacent biopsy markers.   Patient required vasopressors for blood pressure support.   06/17 off vasopressors. 06/18 for HD   Assessment and Plan: * Lobar pneumonia (HCC) Resolved septic shock (present on admission)  Patient as been on antibiotic therapy with cefepime , will transition to po Augmentin  Follow up cell count and temperature curve.  Her cultures have been no growth.  Follow up left breast fluid collection, conservative for now per surgery recommendations.   Acute metabolic encephalopathy, likely multifactorial due to infection and hypotension, today she is back to her baseline Encephalopathy was resolved.   ESRD on hemodialysis [facility]) Patient will have HD today, continue midodrine for blood pressure support.  Now off vasopressors.  Access left forearm graft.   Anemia of chronic renal diease, continue EPO   Metabolic bone disease, continue with calcitriol and phoslo .   Hypotension Continue blood pressure monitoring. Patient has been on midodrine.   Type 2 diabetes mellitus with hyperlipidemia (HCC) Continue glucose cover and monitoring with insulin  sliding scale.  Basal insulin  15 units.  Patient is tolerating po well.  Continue statin therapy.   Hx of BKA (HCC) Pt and Ot  History of left breast cancer Follow up as outpatient.    Subjective: patient continue having cough, with no chest pain, positive dyspnea, no nausea or vomiting   Physical Exam: Vitals:   12/04/23 0100 12/04/23 0200 12/04/23 0319 12/04/23 0802  BP: 94/71 (!) 122/56    Pulse:      Resp: 13 18    Temp:   98.4 F (36.9 C) 98 F (36.7 C)  TempSrc:   Oral Oral  SpO2:      Weight:   99.2 kg   Height:   5' 2 (1.575 m)    Neurology awake and alert, deconditioned ENT with mild pallor Cardiovascular with S1 and S2 present and regular with no gallops, rubs or murmurs Respiratory with poor inspiratory effort with no wheezing or rhonchi  Abdomen with no distention  Positive  non pitting lower extremity edema, mild to moderate right BKA  Data Reviewed:    Family Communication: no family at the bedside   Disposition: Status is: Inpatient Remains inpatient appropriate because: recovering sepsis   Planned Discharge Destination: Home    Author: Albertus Alt, MD 12/04/2023 8:15 AM  For on call review [email or web address].

## 2023-12-04 NOTE — Assessment & Plan Note (Signed)
 Pt and Ot

## 2023-12-05 DIAGNOSIS — E1169 Type 2 diabetes mellitus with other specified complication: Secondary | ICD-10-CM

## 2023-12-05 DIAGNOSIS — N186 End stage renal disease: Secondary | ICD-10-CM | POA: Diagnosis not present

## 2023-12-05 DIAGNOSIS — E785 Hyperlipidemia, unspecified: Secondary | ICD-10-CM

## 2023-12-05 DIAGNOSIS — J181 Lobar pneumonia, unspecified organism: Secondary | ICD-10-CM | POA: Diagnosis not present

## 2023-12-05 DIAGNOSIS — I959 Hypotension, unspecified: Secondary | ICD-10-CM | POA: Diagnosis not present

## 2023-12-05 LAB — BASIC METABOLIC PANEL WITH GFR
Anion gap: 9 (ref 5–15)
BUN: 19 mg/dL (ref 6–20)
CO2: 28 mmol/L (ref 22–32)
Calcium: 8.6 mg/dL — ABNORMAL LOW (ref 8.9–10.3)
Chloride: 99 mmol/L (ref 98–111)
Creatinine, Ser: 5.51 mg/dL — ABNORMAL HIGH (ref 0.44–1.00)
GFR, Estimated: 8 mL/min — ABNORMAL LOW (ref >60)
Glucose, Bld: 69 mg/dL — ABNORMAL LOW (ref 70–99)
Potassium: 3.6 mmol/L (ref 3.5–5.1)
Sodium: 136 mmol/L (ref 135–145)

## 2023-12-05 LAB — CBC
HCT: 28 % — ABNORMAL LOW (ref 36.0–46.0)
Hemoglobin: 9.4 g/dL — ABNORMAL LOW (ref 12.0–15.0)
MCH: 33.3 pg (ref 26.0–34.0)
MCHC: 33.6 g/dL (ref 30.0–36.0)
MCV: 99.3 fL (ref 80.0–100.0)
Platelets: 163 10*3/uL (ref 150–400)
RBC: 2.82 MIL/uL — ABNORMAL LOW (ref 3.87–5.11)
RDW: 14.4 % (ref 11.5–15.5)
WBC: 6.5 10*3/uL (ref 4.0–10.5)
nRBC: 0 % (ref 0.0–0.2)

## 2023-12-05 LAB — CULTURE, BLOOD (ROUTINE X 2)
Culture: NO GROWTH
Culture: NO GROWTH
Special Requests: ADEQUATE
Special Requests: ADEQUATE

## 2023-12-05 MED ORDER — AMOXICILLIN-POT CLAVULANATE 500-125 MG PO TABS: 1.0000 | ORAL_TABLET | Freq: Every day | ORAL | 0 refills | Status: AC

## 2023-12-05 MED ORDER — HEPARIN SOD (PORK) LOCK FLUSH 100 UNIT/ML IV SOLN
500.0000 [IU] | INTRAVENOUS | Status: AC | PRN
Start: 2023-12-05 — End: 2023-12-05
  Administered 2023-12-05: 500 [IU]
  Filled 2023-12-05: qty 5

## 2023-12-05 NOTE — TOC Progression Note (Signed)
 DME note   Patient Details  Name: Kayla Gibson MRN: [id number] Date of Birth: 1960-11-14  Transition of Care [facility]) CM/SW Contact  Orelia Binet, RN Phone Number: 12/05/2023, 11:40 AM  Patient admitted with sepsis, discharge planning to go home.  The patient  is confined to one level of the home environment and there is no toilet on that level.

## 2023-12-05 NOTE — Plan of Care (Signed)
  Problem: Acute Rehab PT Goals(only PT should resolve) Goal: Pt will Roll Supine to Side Outcome: Progressing Flowsheets (Taken 12/05/2023 1152) Pt will Roll Supine to Side: with supervision Goal: Pt Will Go Supine/Side To Sit Outcome: Progressing Flowsheets (Taken 12/05/2023 1152) Pt will go Supine/Side to Sit: with supervision Goal: Pt Will Go Sit To Supine/Side Outcome: Progressing Flowsheets (Taken 12/05/2023 1152) Pt will go Sit to Supine/Side: with supervision Goal: Patient Will Perform Sitting Balance Outcome: Progressing Flowsheets (Taken 12/05/2023 1152) Patient will perform sitting balance: with modified independence Goal: Pt Will Transfer Bed To Chair/Chair To Bed Outcome: Progressing Flowsheets (Taken 12/05/2023 1152) Pt will Transfer Bed to Chair/Chair to Bed:  with mod assist  with min assist   11:53 AM, 12/05/23 Kayla Gibson, MPT Physical Therapist with [facility] 336 [phone] office [phone] mobile phone

## 2023-12-05 NOTE — Discharge Summary (Signed)
 Physician Discharge Summary   Patient: Kayla Gibson MRN: [id number] DOB: 30-Apr-1961  Admit date:     11/30/2023  Discharge date: 12/05/23  Discharge Physician: Albertus Alt   PCP: Tressie Fryer, MD   Recommendations at discharge:    Hold on isosorbide  to prevent hypotension, continue blood pressure support with midodrine.  Continue Augmentin for 3 more days, renal dosing. Follow up with left breast ultrasounds, for fluid density resolution.  Follow up with Dr Dione Franks in 7 to 10 days Follow up with Nephrology as scheduled.  Follow up with routine hemodialysis as outpatient, M-W-F.   Discharge Diagnoses: Principal Problem:   Lobar pneumonia (HCC) Active Problems:   ESRD on hemodialysis (HCC)   Hypotension   Type 2 diabetes mellitus with hyperlipidemia (HCC)   Hx of BKA (HCC)   History of left breast cancer  Resolved Problems:   * No resolved hospital problems. [facility] Course: [name] was admitted to the hospital with the working diagnosis of sepsis due to left lower lobe pneumonia.   63 year old female with a history of ESRD (MWF) chronic hypotension on midodrine on dialysis days, diabetes mellitus type 2, peripheral vascular disease status post right BKA, hypertension, gastroparesis, depression presented with altered mental status.  The patient went to [facility] on 11/28/2023 for difficulties with her AV graft.  Apparently the patient underwent fistulogram and some other procedure.  Patient was kept overnight secondary to some low blood pressures.  She was dialyzed on 11/29/2023 through the fistula.   She was subsequently discharged home.   11/30/2023 around 1 AM, the patient appeared lethargic.  She was taken to [facility] in [place].  She was noted to have low blood pressures in the ED.  She was given IV fluids in the ED.  Mental status improved and patient was discharged home.  Later in the evening on 11/30/2023, the patient was once again slow to  respond.  As result, the patient was brought to [facility] for further evaluation.  On her initial physical examination her temp was 99.8, HR 84 RR 10 and 02 saturation 97% on supplemental 02 per  but hypoxic on room air, lungs had decreased breath sounds, heart with S1 and S2 present and regular, abdomen with no distention and no lower extremity edema. Right BKA.  Scrum with small superficial wound in the right gluteal area. Healed with scap and dry skin, no signs of infection.   Became hypotensive in the ER with MAPs as low as 50.  Chest radiograph with hypoinflation, no cardiomegaly, increased lung marking bilaterally.   EKG 87 bpm, normal axis, qtc 519, sinus rhythm with left atrial enlargement and 1st degree AV block, no significant ST segment or T wave changes. Low voltage.   CT chest with trace left pleural effusion with left lower lobe consolidation. Left breast dermal thickening and soft tissue edema. Prominent left breast glandular tissue underlying mass not excluded. A 3,5 x 1,9 cm fluid density lesion within the left breast with associated adjacent biopsy markers.   Patient required vasopressors for blood pressure support.   06/17 off vasopressors. 06/18 for HD  06/19 patient has been hemodynamically stable, no fever and no dyspnea, tolerated HD well.   Assessment and Plan: * Lobar pneumonia (HCC) Resolved septic shock (present on admission)  Patient as been on antibiotic therapy with cefepime , and then transition to po Augmentin with good toleration.  Patient has been afebrile and cultures have been no growth.   Follow up left breast fluid  collection, conservative for now per surgery recommendations.   Acute metabolic encephalopathy, likely multifactorial due to infection and hypotension, currently she is back to her baseline Encephalopathy was resolved.   ESRD on hemodialysis (HCC) Tolerated well hemodialysis, plan to continue midodrine for blood pressure support.  Access  left forearm graft.   Anemia of chronic renal diease, continue EPO oral iron supplementation   Metabolic bone disease, continue with sensipar , calcitriol and phoslo .   Hypotension Continue blood pressure support with midodrine  Will hold on isosorbide  for now.   Type 2 diabetes mellitus with hyperlipidemia (HCC) Patient was placed on insulin  sliding scale for glucose cover and monitoring.  Continue basal insulin  with close follow up on capillary glucose as outpatient.  Resume home dose of basal insulin  and pre meal short acting.  GLP 1 agonist.  Glucose remained controlled.  Patient is tolerating po well.  Continue statin therapy.   Hx of BKA (HCC) Pt and Ot  History of left breast cancer Follow up as outpatient.        Consultants: Nephrology  Procedures performed: none   Disposition: Home Diet recommendation:  Cardiac and Carb modified diet DISCHARGE MEDICATION: Allergies as of 12/05/2023       Reactions   Ace Inhibitors Shortness Of Breath   Hydrocodone Anaphylaxis, Other (See Comments)   Stopped me from breathing   Hydrocodone-acetaminophen Anaphylaxis   Hydromorphone  Hcl Anaphylaxis, Shortness Of Breath, Other (See Comments)   Stopped me from breathing Hallucinations   Hydromorphone  Hcl Anaphylaxis   Tolerated oxycodone  x 2 doses previously   Iodine Anaphylaxis   Tylenol [acetaminophen] Anaphylaxis   Zolpidem Anaphylaxis, Other (See Comments)   Hallucinations   Alprazolam Other (See Comments)   Hallucination   Zolpidem Tartrate Other (See Comments)   Hallucinations   Bee Venom Swelling   Povidone-iodine Hives, Itching   04/19/2021 pt is not allergic to IV contrast        Medication List     STOP taking these medications    isosorbide  mononitrate 30 MG 24 hr tablet Commonly known as: IMDUR        TAKE these medications    amoxicillin-clavulanate 500-125 MG tablet Commonly known as: AUGMENTIN Take 1 tablet by mouth at bedtime for 3  days.   aspirin  EC 81 MG tablet Take 81 mg by mouth every morning.   atorvastatin 40 MG tablet Commonly known as: LIPITOR Take 40 mg by mouth daily.   calcitRIOL 0.5 MCG capsule Commonly known as: ROCALTROL Take 0.5 mcg by mouth 2 (two) times daily.   calcium  acetate 667 MG capsule Commonly known as: PHOSLO  Take 667 mg by mouth 3 (three) times daily.   cinacalcet  30 MG tablet Commonly known as: SENSIPAR  Take 30 mg by mouth every evening.   Cymbalta 20 MG capsule Generic drug: DULoxetine Take 20 mg by mouth daily.   dorzolamide-timolol 2-0.5 % ophthalmic solution Commonly known as: COSOPT Place 1 drop into the right eye 2 (two) times daily.   famotidine  20 MG tablet Commonly known as: PEPCID  Take 20 mg by mouth 2 (two) times daily.   fenofibrate  48 MG tablet Commonly known as: TRICOR  Take 48 mg by mouth daily.   gabapentin 300 MG capsule Commonly known as: NEURONTIN Take 300 mg by mouth 2 (two) times daily.   HumaLOG KwikPen 100 UNIT/ML KwikPen Generic drug: insulin  lispro Inject 18 Units into the skin 3 (three) times daily.   Lantus  SoloStar 100 UNIT/ML Solostar Pen Generic drug: insulin  glargine Inject 38 Units  into the skin at bedtime.   midodrine 10 MG tablet Commonly known as: PROAMATINE Take 10 mg by mouth 3 (three) times daily as needed (hypotension).   ondansetron  4 MG disintegrating tablet Commonly known as: ZOFRAN -ODT Take 8 mg by mouth every 8 (eight) hours as needed for nausea or vomiting.   Ozempic (0.25 or 0.5 MG/DOSE) 2 MG/3ML Sopn Generic drug: Semaglutide(0.25 or 0.5MG /DOS) Inject 0.25 mg into the skin every Thursday.   RenaPlex-D Tabs Take 1 tablet by mouth every evening.   traMADol  50 MG tablet Commonly known as: ULTRAM  Take 50 mg by mouth daily as needed. For back  pain   traZODone 150 MG tablet Commonly known as: DESYREL Take 150 mg by mouth daily.   Velphoro 500 MG chewable tablet Generic drug: sucroferric  oxyhydroxide Chew 500 mg by mouth 3 (three) times daily with meals.               Durable Medical Equipment  (From admission, onward)           Start     Ordered   12/05/23 0943  For home use only DME 3 n 1  Once        12/05/23 0942            Discharge Exam: Filed Weights   12/04/23 0319 12/04/23 1106 12/04/23 1517  Weight: 99.2 kg 99 kg 96.2 kg   BP (!) 135/56 (BP Location: Right Arm)   Pulse 83   Temp 98.4 F (36.9 C) (Oral)   Resp 16   Ht 5' 2 (1.575 m)   Wt 96.2 kg   SpO2 100%   BMI 38.79 kg/m   Patient is feeling better, back to her baseline, has no chest pain and no dyspnea, tolerating po well with no nausea or vomiting  Neurology awake and alert ENT with no pallor or icterus Cardiovascular with S1 and S2 present and regular with no gallops, rubs or murmurs Respiratory with no rales or wheezing, no rhonchi  Abdomen with no distention, not tender, soft to palpation  Right  BKA and left foot transmetatarsal amputation.  No lower extremity edema   Condition at discharge: stable  The results of significant diagnostics from this hospitalization (including imaging, microbiology, ancillary and laboratory) are listed below for reference.   Imaging Studies: CT CHEST WO CONTRAST Result Date: 12/02/2023 CLINICAL DATA:  Dyspnea, chronic, unclear etiology Respiratory illness, nondiagnostic xray cough, chest congestion, sepsis EXAM: CT CHEST WITHOUT CONTRAST TECHNIQUE: Multidetector CT imaging of the chest was performed following the standard protocol without IV contrast. RADIATION DOSE REDUCTION: This exam was performed according to the departmental dose-optimization program which includes automated exposure control, adjustment of the mA and/or kV according to patient size and/or use of iterative reconstruction technique. COMPARISON:  Chest x-ray 11/30/2023 CT angio chest 02/27/2014 FINDINGS: Cardiovascular: Normal heart size. Question thickened pericardium  with no definite pericardial effusion. The thoracic aorta is normal in caliber. No atherosclerotic plaque of the thoracic aorta. Four-vessel coronary artery calcifications. Mediastinum/Nodes: No enlarged mediastinal or axillary lymph nodes. Thyroid gland, trachea, demonstrate no significant findings. Patulous esophagus. Lungs/Pleura: Trace left pleural effusion with left lower lobe consolidation. Lingular atelectasis. No pulmonary nodule. No pulmonary mass. No right pleural effusion. No pneumothorax. Upper Abdomen: Colonic diverticulosis. Musculoskeletal: A 3.5 x 1.9 cm fluid density lesion within the left breast with associated adjacent biopsy markers. Left axillary stent graft. Prominent left breast glandular tissue with underlying mass not excluded. Left breast dermal thickening. Left breast soft  tissue edema. Right chest wall Port-A-Cath with tip terminating at the superior cavoatrial junction. Venous collaterals along the chest. No suspicious lytic or blastic osseous lesions. No acute displaced fracture. IMPRESSION: 1. Trace left pleural effusion with left lower lobe consolidation. Consolidation could represent a combination of atelectasis versus infection/inflammation. Limited evaluation on this noncontrast study. Recommend repeat CT in 3 months to evaluate for complete resolution. 2. Left breast dermal thickening and soft tissue edema. Prominent left breast glandular tissue with underlying mass not excluded. A 3.5 x 1.9 cm fluid density lesion within the left breast with associated adjacent biopsy markers. Correlate with mammography. 3. Question thickened pericardium with no definite pericardial effusion. 4. Aortic Atherosclerosis (ICD10-I70.0) including four-vessel coronary calcifications. Electronically Signed   By: Morgane  Naveau M.D.   On: 12/02/2023 19:36   DG Chest Port 1 View Result Date: 11/30/2023 CLINICAL DATA:  Possible sepsis. Recent dialysis site procedure 11/28/2023. EXAM: PORTABLE CHEST 1  VIEW COMPARISON:  03/01/2014 FINDINGS: Lordotic technique is demonstrated. Right IJ Port-A-Cath with tip over the SVC. Lungs are adequately inflated without focal airspace consolidation or effusion. Possible minimal prominence of the perihilar vessels suggesting mild vascular congestion versus secondary to the degree of hypoinflation. Cardiomediastinal silhouette is unremarkable. Vascular stent over the left axillary region. Remainder of the exam is unchanged. IMPRESSION: 1. No focal airspace consolidation. 2. Possible minimal prominence of the perihilar vessels suggesting mild vascular congestion versus secondary to the degree of hypoinflation. Electronically Signed   By: Roda Cirri M.D.   On: 11/30/2023 16:10   CT Head Wo Contrast Result Date: 11/30/2023 CLINICAL DATA:  Altered mental status.  Weakness. EXAM: CT HEAD WITHOUT CONTRAST TECHNIQUE: Contiguous axial images were obtained from the base of the skull through the vertex without intravenous contrast. RADIATION DOSE REDUCTION: This exam was performed according to the departmental dose-optimization program which includes automated exposure control, adjustment of the mA and/or kV according to patient size and/or use of iterative reconstruction technique. COMPARISON:  03/01/2014 FINDINGS: Brain: No evidence of intracranial hemorrhage, acute infarction, hydrocephalus, extra-axial collection, or mass lesion/mass effect. Mild diffuse cerebellar atrophy noted. Vascular:  No hyperdense vessel or other acute findings. Skull: No evidence of fracture or other significant bone abnormality. Sinuses/Orbits:  No acute findings. Other: None. IMPRESSION: No acute intracranial abnormality. Mild cerebellar atrophy. Electronically Signed   By: Marlyce Sine M.D.   On: 11/30/2023 16:09    Microbiology: Results for orders placed or performed during the hospital encounter of 11/30/23  Blood Culture (routine x 2)     Status: None   Collection Time: 11/30/23  3:14 PM    Specimen: Right Antecubital; Blood  Result Value Ref Range Status   Specimen Description   Final    RIGHT ANTECUBITAL BOTTLES DRAWN AEROBIC AND ANAEROBIC   Special Requests Blood Culture adequate volume  Final   Culture   Final    NO GROWTH 5 DAYS Performed at [facility], [address] [place], [place] [address]    Report Status 12/05/2023 FINAL  Final  Blood Culture (routine x 2)     Status: None   Collection Time: 11/30/23  3:14 PM   Specimen: BLOOD RIGHT HAND  Result Value Ref Range Status   Specimen Description   Final    BLOOD RIGHT HAND BOTTLES DRAWN AEROBIC AND ANAEROBIC   Special Requests Blood Culture adequate volume  Final   Culture   Final    NO GROWTH 5 DAYS Performed at [facility], [address], [place],  [place] [address]    Report Status 12/05/2023 FINAL  Final  MRSA Next Gen by PCR, Nasal     Status: None   Collection Time: 11/30/23  9:18 PM   Specimen: Nasal Mucosa; Nasal Swab  Result Value Ref Range Status   MRSA by PCR Next Gen NOT DETECTED NOT DETECTED Final    Comment: (NOTE) The GeneXpert MRSA Assay (FDA approved for NASAL specimens only), is one component of a comprehensive MRSA colonization surveillance program. It is not intended to diagnose MRSA infection nor to guide or monitor treatment for MRSA infections. Test performance is not FDA approved in patients less than 35 years old. Performed at [facility], [address], [place], [place] [address]   Respiratory (~20 pathogens) panel by PCR     Status: None   Collection Time: 12/02/23  1:17 PM   Specimen: Nasopharyngeal Swab; Respiratory  Result Value Ref Range Status   Adenovirus NOT DETECTED NOT DETECTED Final   Coronavirus 229E NOT DETECTED NOT DETECTED Final    Comment: (NOTE) The Coronavirus on the Respiratory Panel, DOES NOT test for the novel  Coronavirus (2019 nCoV)    Coronavirus HKU1 NOT DETECTED NOT DETECTED Final   Coronavirus NL63 NOT DETECTED NOT DETECTED Final   Coronavirus  OC43 NOT DETECTED NOT DETECTED Final   Metapneumovirus NOT DETECTED NOT DETECTED Final   Rhinovirus / Enterovirus NOT DETECTED NOT DETECTED Final   Influenza A NOT DETECTED NOT DETECTED Final   Influenza B NOT DETECTED NOT DETECTED Final   Parainfluenza Virus 1 NOT DETECTED NOT DETECTED Final   Parainfluenza Virus 2 NOT DETECTED NOT DETECTED Final   Parainfluenza Virus 3 NOT DETECTED NOT DETECTED Final   Parainfluenza Virus 4 NOT DETECTED NOT DETECTED Final   Respiratory Syncytial Virus NOT DETECTED NOT DETECTED Final   Bordetella pertussis NOT DETECTED NOT DETECTED Final   Bordetella Parapertussis NOT DETECTED NOT DETECTED Final   Chlamydophila pneumoniae NOT DETECTED NOT DETECTED Final   Mycoplasma pneumoniae NOT DETECTED NOT DETECTED Final    Comment: Performed at [facility], [address], [place], [place] [address]    Labs: CBC: Recent Labs  Lab 11/30/23 1514 12/01/23 0210 12/02/23 0414 12/04/23 0527 12/05/23 0424  WBC 8.3 9.6 10.0 6.9 6.5  NEUTROABS 6.3  --   --   --   --   HGB 8.6* 9.4* 9.4* 8.6* 9.4*  HCT 26.2* 29.6* 28.6* 25.8* 28.0*  MCV 101.9* 102.8* 101.8* 99.2 99.3  PLT 144* 145* 147* 153 163   Basic Metabolic Panel: Recent Labs  Lab 11/30/23 1514 12/01/23 0210 12/02/23 0414 12/05/23 0424  NA 137 135 137 136  K 4.2 3.8 4.2 3.6  CL 98 101 102 99  CO2 24 19* 22 28  GLUCOSE 152* 148* 151* 69*  BUN 34* 36* 46* 19  CREATININE 6.60* 6.52* 8.47* 5.51*  CALCIUM  7.0* 6.4* 7.2* 8.6*  MG 2.1  --   --   --   PHOS  --   --  6.7*  --    Liver Function Tests: Recent Labs  Lab 11/30/23 1514 12/01/23 0210 12/02/23 0414  AST 26 34  --   ALT 7 6  --   ALKPHOS 96 87  --   BILITOT 0.7 0.9  --   PROT 7.4 6.7  --   ALBUMIN 3.0* 2.9* 2.6*   CBG: Recent Labs  Lab 11/30/23 2234 12/01/23 0226 12/01/23 2140 12/02/23 2206 12/03/23 2132  GLUCAP 90 134* 165* 99 178*  Discharge time spent: greater than 30 minutes.  Signed: Albertus Alt,  MD [facility] 12/05/2023

## 2023-12-05 NOTE — Plan of Care (Signed)
  Problem: Education: Goal: Knowledge of General Education information will improve Description: Including pain rating scale, medication(s)/side effects and non-pharmacologic comfort measures Outcome: Progressing   Problem: Clinical Measurements: Goal: Ability to maintain clinical measurements within normal limits will improve Outcome: Progressing Goal: Will remain free from infection Outcome: Progressing Goal: Diagnostic test results will improve Outcome: Progressing Goal: Respiratory complications will improve Outcome: Progressing Goal: Cardiovascular complication will be avoided Outcome: Progressing   Problem: Activity: Goal: Risk for activity intolerance will decrease Outcome: Progressing   Problem: Elimination: Goal: Will not experience complications related to bowel motility Outcome: Progressing Goal: Will not experience complications related to urinary retention Outcome: Progressing   Problem: Pain Managment: Goal: General experience of comfort will improve and/or be controlled Outcome: Progressing   Problem: Safety: Goal: Ability to remain free from injury will improve Outcome: Progressing   Problem: Skin Integrity: Goal: Risk for impaired skin integrity will decrease Outcome: Progressing

## 2023-12-05 NOTE — Progress Notes (Addendum)
 Patient discharged home with instructions given on medications and follow up visits,patient verbalized understanding. Prescriptions sent to Pharmacy of choice documented on AVS. Port-a-cath de-accessed and flushed per policy and Dr Sunnie England Barnett Libel intact,patient tolerated procedure.IV discontinued,catheter intact.  Accompanied by staff to an awaiting vehicle.

## 2023-12-05 NOTE — Evaluation (Signed)
 Physical Therapy Evaluation Patient Details Name: Kayla Gibson MRN: [id number] DOB: 1960-12-29 Today's Date: 12/05/2023  History of Present Illness  Kayla Gibson is a 62/F with history of ESRD on hemodialysis, chronic hypotension on midodrine, type 2 diabetes mellitus, peripheral vascular disease, BKA, hypertension, gastroparesis, depression, was admitted to [facility] on 6/12 on account of AV fistula flow issues, underwent fistulogram on Thursday 6/12, subsequently observed the following day on account of low blood pressures, discharged yesterday (6/13 )morning, after this family noticed that she was extremely tired sleepy and weak, they took her to [facility] in [place]  yesterday evening, she they gave her some fluids and discharged her after midnight very early this morning.  After going home patient's spouse noticed that she was lethargic, slumped down, not her usual self and not communicating, subsequently brought her to [facility].  ED Course: Temp was 99.8, initially hypoxic, became hypotensive in the ER with maps as low as 50, slowly improving, starting on a fluid bolus and antibiotics now.  EDP called critical care team at [facility] who recommended we try to stabilize her and admit her to stepdown at [facility], labs noted glucose 152, creatinine 6.6, BUN 34, WBC 8.3, hemoglobin 8.6, lactic acid 1.6, 1.4, CT head with no acute findings, chest x-ray with?  Mild pulmonary vascular congestion   Clinical Impression  Patient demonstrates slow labored movement for sitting up at bedside with [facility] raised, good sitting balance once seated, required much time and frequent rest breaks when laterally scooting over to chair, patient's spouse demonstrates good return for assisting and later able to transfer patient to chair by himself. Patient tolerated sitting up in chair after therapy with spouse present. Patient will benefit from continued skilled physical therapy in  hospital and recommended venue below to increase strength, balance, endurance for safe ADLs and tramsfers.           If plan is discharge home, recommend the following: A lot of help with walking and/or transfers;Help with stairs or ramp for entrance;Assistance with cooking/housework;A lot of help with bathing/dressing/bathroom   Can travel by private vehicle        Equipment Recommendations BSC/3in1  Recommendations for Other Services       Functional Status Assessment Patient has had a recent decline in their functional status and demonstrates the ability to make significant improvements in function in a reasonable and predictable amount of time.     Precautions / Restrictions Precautions Precautions: Fall Recall of Precautions/Restrictions: Intact Precaution/Restrictions Comments: L BKA; R toe amputations Restrictions Weight Bearing Restrictions Per Provider Order: No      Mobility  Bed Mobility Overal bed mobility: Needs Assistance Bed Mobility: Supine to Sit     Supine to sit: Min assist, HOB elevated     General bed mobility comments: increased time with labored movement, required HOB raised    Transfers Overall transfer level: Needs assistance Equipment used: 1 person hand held assist              Lateral/Scoot Transfers: Mod assist General transfer comment: required increased time, frequent rest breaks scooting over to [facility] from bed    Ambulation/Gait                  Stairs            Wheelchair Mobility     Tilt Bed    Modified Rankin (Stroke Patients Only)       Balance Overall balance assessment: Needs assistance  Sitting-balance support: Feet supported, No upper extremity supported Sitting balance-Leahy Scale: Good Sitting balance - Comments: seated at EOB                                     Pertinent Vitals/Pain Pain Assessment Pain Assessment: No/denies pain    Home Living Family/patient expects  to be discharged to:: Private residence Living Arrangements: Spouse/significant other Available Help at Discharge: Family;Available 24 hours/day Type of Home: Mobile home Home Access: Ramped entrance       Home Layout: One level Home Equipment: Agricultural consultant (2 wheels);Tub bench;Wheelchair - manual;Hospital bed      Prior Function Prior Level of Function : Needs assist       Physical Assist : ADLs (physical);Mobility (physical) Mobility (physical): Transfers;Bed mobility ADLs (physical): Bathing;Dressing;Toileting;IADLs Mobility Comments: Pt does sliding transfers to w/c at baseline. Assist for bed mobility at times. w/c used for mobility overall. ADLs Comments: Assist bathing, dressing, and toileting. Assist IADL's.     Extremity/Trunk Assessment   Upper Extremity Assessment Upper Extremity Assessment: Defer to OT evaluation    Lower Extremity Assessment Lower Extremity Assessment: Generalized weakness    Cervical / Trunk Assessment Cervical / Trunk Assessment: Kyphotic  Communication   Communication Communication: No apparent difficulties    Cognition Arousal: Alert Behavior During Therapy: WFL for tasks assessed/performed   PT - Cognitive impairments: No apparent impairments                         Following commands: Intact       Cueing Cueing Techniques: Verbal cues, Tactile cues     General Comments      Exercises     Assessment/Plan    PT Assessment Patient needs continued PT services  PT Problem List Decreased strength;Decreased activity tolerance;Decreased balance;Decreased mobility       PT Treatment Interventions DME instruction;Functional mobility training;Therapeutic activities;Therapeutic exercise;Balance training;Patient/family education;Wheelchair mobility training    PT Goals (Current goals can be found in the Care Plan section)  Acute Rehab PT Goals Patient Stated Goal: return home with family to assist PT Goal  Formulation: With patient/family Time For Goal Achievement: 12/08/23 Potential to Achieve Goals: Good    Frequency Min 3X/week     Co-evaluation PT/OT/SLP Co-Evaluation/Treatment: Yes Reason for Co-Treatment: To address functional/ADL transfers PT goals addressed during session: Mobility/safety with mobility;Balance;Proper use of DME OT goals addressed during session: ADL's and self-care       AM-PAC PT 6 Clicks Mobility  Outcome Measure Help needed turning from your back to your side while in a flat bed without using bedrails?: A Little Help needed moving from lying on your back to sitting on the side of a flat bed without using bedrails?: A Lot Help needed moving to and from a bed to a chair (including a wheelchair)?: A Lot Help needed standing up from a chair using your arms (e.g., wheelchair or bedside chair)?: Total Help needed to walk in hospital room?: Total Help needed climbing 3-5 steps with a railing? : Total 6 Click Score: 10    End of Session   Activity Tolerance: Patient tolerated treatment well;Patient limited by fatigue Patient left: Other (comment);with call bell/phone within reach;with family/visitor present (Patient left seated on [facility]) Nurse Communication: Mobility status PT Visit Diagnosis: Unsteadiness on feet (R26.81);Other abnormalities of gait and mobility (R26.89);Muscle weakness (generalized) (M62.81)    Time: 1610-9604 PT  Time Calculation (min) (ACUTE ONLY): 33 min   Charges:   PT Evaluation $PT Eval Moderate Complexity: 1 Mod PT Treatments $Therapeutic Activity: 23-37 mins PT General Charges $$ ACUTE PT VISIT: 1 Visit         11:50 AM, 12/05/23 Walton Guppy, MPT Physical Therapist with [facility] [phone] office [phone] mobile phone

## 2023-12-05 NOTE — Progress Notes (Signed)
 Patient ID: Kayla Gibson, female   DOB: 1961-01-28, 63 y.o.   MRN: [id number] [facility] Progress Note   Assessment/ Plan:   1.  Shock/hypotension with metabolic encephalopathy: Possibly for CAP.  Shiley on broad-spectrum antimicrobial therapy with doxycycline and cefepime  now transition to Augmentin.  Blood culture from 11/30/2023 negative to date with no identifiable respiratory pathogen. 2. ESRD: She is typically on Monday/Wednesday/Friday schedule for hemodialysis and plan for dialysis tomorrow.  Pressors have been weaned off and she is on midodrine for dialysis; will monitor blood pressures with hemodialysis/ultrafiltration. 3. Anemia: Without overt blood loss.  Hemoglobin and hematocrit trending down without overt blood loss, perceived Aranesp 100 mcg on 6/18. Hb 9.4. 4. CKD-MBD: Elevated phosphorus noted around calcium  level that is low- Continue calcium  acetate 3 times daily AC for phosphorus. Recheck phos Fr.  5. Nutrition: On renal diet, will continue to follow.  Subjective:   Complains of poor sleep overnight and itching in her right hand.  Denies shortness of breath, fever chills.  Spouse bedside updated   Objective:   BP 135/75 (BP Location: Right Wrist)   Pulse 87   Temp 97.7 F (36.5 C) (Oral)   Resp 16   Ht 5' 2 (1.575 m)   Wt 96.2 kg   SpO2 98%   BMI 38.79 kg/m   Physical Exam: Gen: She appears uncomfortable resting in bed CVS: Pulse regular rhythm, normal rate, S1 and S2 normal Resp: Clear to auscultation, no rales/rhonchi Abd: Soft, obese, bowel sounds normal Ext: Status post left leg TMA, right leg BKA.  Left forearm AV graft with clean gauze dressing wrapped, + bruit  Labs: BMET Recent Labs  Lab 11/30/23 1514 12/01/23 0210 12/02/23 0414 12/05/23 0424  NA 137 135 137 136  K 4.2 3.8 4.2 3.6  CL 98 101 102 99  CO2 24 19* 22 28  GLUCOSE 152* 148* 151* 69*  BUN 34* 36* 46* 19  CREATININE 6.60* 6.52* 8.47* 5.51*  CALCIUM  7.0* 6.4* 7.2*  8.6*  PHOS  --   --  6.7*  --    CBC Recent Labs  Lab 11/30/23 1514 12/01/23 0210 12/02/23 0414 12/04/23 0527 12/05/23 0424  WBC 8.3 9.6 10.0 6.9 6.5  NEUTROABS 6.3  --   --   --   --   HGB 8.6* 9.4* 9.4* 8.6* 9.4*  HCT 26.2* 29.6* 28.6* 25.8* 28.0*  MCV 101.9* 102.8* 101.8* 99.2 99.3  PLT 144* 145* 147* 153 163      Medications:     amoxicillin-clavulanate  1 tablet Oral QHS   aspirin  EC  81 mg Oral q morning   atorvastatin  40 mg Oral Daily   calcitRIOL  0.5 mcg Oral BID   calcium  acetate  667 mg Oral TID with meals   Chlorhexidine Gluconate Cloth  6 each Topical Daily   darbepoetin (ARANESP) injection - DIALYSIS  100 mcg Subcutaneous Q Wed-1800   DULoxetine  20 mg Oral Daily   famotidine   20 mg Oral QHS   heparin   5,000 Units Subcutaneous Q8H   insulin  glargine-yfgn  15 Units Subcutaneous QHS   midodrine  10 mg Oral TID WC   multivitamin  1 tablet Oral QHS   Aloysius Janus, MD 12/05/2023, 9:15 AM

## 2023-12-05 NOTE — Plan of Care (Signed)
  Problem: Acute Rehab OT Goals (only OT should resolve) Goal: Pt. Will Transfer To Toilet Flowsheets (Taken 12/05/2023 0941) Pt Will Transfer to Toilet: with modified independence Goal: Pt/Caregiver Will Perform Home Exercise Program Flowsheets (Taken 12/05/2023 0941) Pt/caregiver will Perform Home Exercise Program:  Increased strength  Both right and left upper extremity  Independently  Daiveon Markman OT, MOT

## 2023-12-05 NOTE — Evaluation (Signed)
 Occupational Therapy Evaluation Patient Details Name: Kayla Gibson MRN: [id number] DOB: [birth year] Today's Date: 12/05/2023   History of Present Illness   Kayla Gibson is a 62/F with history of ESRD on hemodialysis, chronic hypotension on midodrine, type 2 diabetes mellitus, peripheral vascular disease, BKA, hypertension, gastroparesis, depression, was admitted to [facility] on 6/12 on account of AV fistula flow issues, underwent fistulogram on Thursday 6/12, subsequently observed the following day on account of low blood pressures, discharged yesterday (6/13 )morning, after this family noticed that she was extremely tired sleepy and weak, they took her to [facility] in [place]  yesterday evening, she they gave her some fluids and discharged her after midnight very early this morning.  After going home patient's spouse noticed that she was lethargic, slumped down, not her usual self and not communicating, subsequently brought her to [facility].  ED Course: Temp was 99.8, initially hypoxic, became hypotensive in the ER with maps as low as 50, slowly improving, starting on a fluid bolus and antibiotics now.  EDP called critical care team at [facility] who recommended we try to stabilize her and admit her to stepdown at [facility], labs noted glucose 152, creatinine 6.6, BUN 34, WBC 8.3, hemoglobin 8.6, lactic acid 1.6, 1.4, CT head with no acute findings, chest x-ray with?  Mild pulmonary vascular congestion (per MD)     Clinical Impressions Pt agreeable to OT and PT co-evaluation. Pt assisted by spouse at baseline. Sliding/scooting transfer at baseline with PRN assist. Pt able to complete EOB to [facility] with min A today. Husband present and assisting. B UE generally weak. Pt assisted for dressing, toileting, and bathing at baseline. Husband reports not having a BSC at home. Pt left on the [facility] with family present and instructions to get nursing to assist once done. Pt  will benefit from continued OT in the hospital and recommended venue below to increase strength, balance, and endurance for safe ADL's.        If plan is discharge home, recommend the following:   A little help with walking and/or transfers;A lot of help with bathing/dressing/bathroom;Assistance with cooking/housework;Assist for transportation;Help with stairs or ramp for entrance     Functional Status Assessment   Patient has had a recent decline in their functional status and demonstrates the ability to make significant improvements in function in a reasonable and predictable amount of time.     Equipment Recommendations   BSC/3in1             Precautions/Restrictions   Precautions Precautions: Fall Recall of Precautions/Restrictions: Intact Precaution/Restrictions Comments: L BKA; R toe amputations Restrictions Weight Bearing Restrictions Per Provider Order: No     Mobility Bed Mobility Overal bed mobility: Needs Assistance Bed Mobility: Supine to Sit     Supine to sit: Min assist     General bed mobility comments: Assist to pull to sit    Transfers                   General transfer comment: sliding transfer to [facility] from the bed with CGA to min A; enxtended time and labored movement; spouse present and assisting.      Balance Overall balance assessment: Needs assistance Sitting-balance support: No upper extremity supported, Feet supported Sitting balance-Leahy Scale: Good Sitting balance - Comments: seated at EOB  ADL either performed or assessed with clinical judgement   ADL Overall ADL's : Needs assistance/impaired     Grooming: Set up;Sitting       Lower Body Bathing: Maximal assistance   Upper Body Dressing : Set up   Lower Body Dressing: Maximal assistance   Toilet Transfer: Minimal assistance;BSC/3in1 (sliding/scooting transfer) Toilet Transfer Details (indicate cue type  and reason): EOB to [facility] Toileting- Clothing Manipulation and Hygiene: Maximal assistance;Total assistance               Vision Baseline Vision/History: 0 No visual deficits Ability to See in Adequate Light: 0 Adequate Patient Visual Report: No change from baseline Vision Assessment?: No apparent visual deficits     Perception Perception: Not tested       Praxis Praxis: Not tested       Pertinent Vitals/Pain Pain Assessment Pain Assessment: Faces Faces Pain Scale: No hurt     Extremity/Trunk Assessment Upper Extremity Assessment Upper Extremity Assessment: Generalized weakness   Lower Extremity Assessment Lower Extremity Assessment: Defer to PT evaluation       Communication Communication Communication: No apparent difficulties   Cognition Arousal: Alert Behavior During Therapy: WFL for tasks assessed/performed Cognition: No apparent impairments                               Following commands: Intact       Cueing  General Comments   Cueing Techniques: Verbal cues;Tactile cues                 Home Living Family/patient expects to be discharged to:: Private residence Living Arrangements: Spouse/significant other Available Help at Discharge: Family;Available 24 hours/day Type of Home: Mobile home Home Access: Ramped entrance     Home Layout: One level     Bathroom Shower/Tub: Chief Strategy Officer: Standard Bathroom Accessibility: Yes How Accessible: Accessible via wheelchair;Accessible via walker Home Equipment: Rolling Walker (2 wheels);Tub bench;Wheelchair - manual;Hospital bed          Prior Functioning/Environment Prior Level of Function : Needs assist       Physical Assist : ADLs (physical);Mobility (physical) Mobility (physical): Transfers;Bed mobility ADLs (physical): Bathing;Dressing;Toileting;IADLs Mobility Comments: Pt does sliding transfers to w/c at baseline. Assist for bed mobility at times.  w/c used for mobility overall. ADLs Comments: Assist bathing, dressing, and toileting. Assist IADL's.    OT Problem List: Decreased strength;Decreased activity tolerance   OT Treatment/Interventions: Self-care/ADL training;Therapeutic exercise;Therapeutic activities;Patient/family education;DME and/or AE instruction      OT Goals(Current goals can be found in the care plan section)   Acute Rehab OT Goals Patient Stated Goal: return home OT Goal Formulation: With patient/family Time For Goal Achievement: 12/19/23 Potential to Achieve Goals: Good   OT Frequency:  Min 1X/week    Co-evaluation PT/OT/SLP Co-Evaluation/Treatment: Yes Reason for Co-Treatment: To address functional/ADL transfers   OT goals addressed during session: ADL's and self-care                       End of Session    Activity Tolerance: Patient tolerated treatment well Patient left: Other (comment) (On the [facility] with instructions to have nursing assist once pt was doen with her bowel movement.)  OT Visit Diagnosis: Muscle weakness (generalized) (M62.81)                Time: 1610-9604 OT Time Calculation (min): 18 min Charges:  OT General Charges $  OT Visit: 1 Visit OT Evaluation $OT Eval Low Complexity: 1 Low  Lynae Pederson OT, MOT  Thurnell Floss 12/05/2023, 9:39 AM
